# Patient Record
Sex: Female | Born: 1990 | Race: White | Hispanic: Yes | Marital: Single | Smoking: Never smoker
Health system: Southern US, Community
[De-identification: ages and names within clinical notes are randomized; demographics above are authoritative.]

## PROBLEM LIST (undated history)

## (undated) DIAGNOSIS — E039 Hypothyroidism, unspecified: Secondary | ICD-10-CM

## (undated) DIAGNOSIS — F32A Depression, unspecified: Secondary | ICD-10-CM

## (undated) DIAGNOSIS — E0590 Thyrotoxicosis, unspecified without thyrotoxic crisis or storm: Secondary | ICD-10-CM

## (undated) DIAGNOSIS — I10 Essential (primary) hypertension: Secondary | ICD-10-CM

## (undated) HISTORY — DX: Essential (primary) hypertension: I10

## (undated) HISTORY — PX: NO PAST SURGERIES: SHX2092

## (undated) HISTORY — DX: Thyrotoxicosis, unspecified without thyrotoxic crisis or storm: E05.90

---
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
## 2013-11-15 ENCOUNTER — Ambulatory Visit: Payer: Self-pay | Attending: Internal Medicine

## 2013-12-13 ENCOUNTER — Telehealth: Payer: Self-pay

## 2013-12-13 NOTE — Telephone Encounter (Signed)
Pt would like to set up an Appointment  to [facility].Pt can be reached at # listed in Epic.

## 2014-01-25 ENCOUNTER — Ambulatory Visit: Payer: Self-pay | Admitting: Family Medicine

## 2015-05-11 ENCOUNTER — Emergency Department (HOSPITAL_COMMUNITY)
Admission: EM | Admit: 2015-05-11 | Discharge: 2015-05-11 | Disposition: A | Discharge status: Home / Self Care | Payer: No Typology Code available for payment source | Attending: Emergency Medicine | Admitting: Emergency Medicine

## 2015-05-11 ENCOUNTER — Emergency Department (HOSPITAL_COMMUNITY): Payer: No Typology Code available for payment source

## 2015-05-11 ENCOUNTER — Encounter (HOSPITAL_COMMUNITY): Payer: Self-pay | Admitting: Emergency Medicine

## 2015-05-11 DIAGNOSIS — S8391XA Sprain of unspecified site of right knee, initial encounter: Secondary | ICD-10-CM | POA: Diagnosis not present

## 2015-05-11 DIAGNOSIS — F10129 Alcohol abuse with intoxication, unspecified: Secondary | ICD-10-CM | POA: Diagnosis not present

## 2015-05-11 DIAGNOSIS — S199XXA Unspecified injury of neck, initial encounter: Secondary | ICD-10-CM | POA: Diagnosis present

## 2015-05-11 DIAGNOSIS — S01511A Laceration without foreign body of lip, initial encounter: Secondary | ICD-10-CM | POA: Diagnosis not present

## 2015-05-11 DIAGNOSIS — Y92410 Unspecified street and highway as the place of occurrence of the external cause: Secondary | ICD-10-CM | POA: Diagnosis not present

## 2015-05-11 DIAGNOSIS — Z3202 Encounter for pregnancy test, result negative: Secondary | ICD-10-CM | POA: Insufficient documentation

## 2015-05-11 DIAGNOSIS — Y9389 Activity, other specified: Secondary | ICD-10-CM | POA: Diagnosis not present

## 2015-05-11 DIAGNOSIS — S161XXA Strain of muscle, fascia and tendon at neck level, initial encounter: Secondary | ICD-10-CM | POA: Diagnosis not present

## 2015-05-11 DIAGNOSIS — Y998 Other external cause status: Secondary | ICD-10-CM | POA: Diagnosis not present

## 2015-05-11 LAB — PREGNANCY, URINE: Preg Test, Ur: NEGATIVE

## 2015-05-11 MED ORDER — CYCLOBENZAPRINE HCL 10 MG PO TABS: 10.0000 mg | ORAL_TABLET | Freq: Three times a day (TID) | ORAL | Status: DC | PRN

## 2015-05-11 MED ORDER — TRAMADOL HCL 50 MG PO TABS: 50.0000 mg | ORAL_TABLET | Freq: Four times a day (QID) | ORAL | Status: DC | PRN
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
## 2015-05-11 NOTE — ED Notes (Signed)
Patient transported to X-ray 

## 2015-05-11 NOTE — Discharge Instructions (Signed)
Tramadol as prescribed as needed for pain.  Return to the emergency department if symptoms significantly worsen or change.   Mouth Laceration A mouth laceration is a deep cut inside your mouth. The cut may go into your lip or go all of the way through your mouth and cheek. The cut may involve your tongue, the insides of your check, or the upper surface of your mouth (palate). Mouth lacerations may bleed a lot and may need to be treated with stitches (sutures). HOME CARE  Take medicines only as told by your doctor.  If you were prescribed an antibiotic medicine, finish all of it even if you start to feel better.  Eat as told by your doctor. You may only be able to eat drink liquids or eat soft foods for a few days.  Rinse your mouth with a warm, salt-water rinse 4-6 times per day or as told by your doctor. You can make a salt-water rinse by mixing one tsp of salt into two cups of warm water.  Do not poke the sutures with your tongue. Doing that can loosen them.  Check your wound every day for signs of infection. It is normal to have a white or gray patch over your wound while it heals. Watch for:  Redness.  Puffiness (swelling).  Blood or pus.  Keep your mouth and teeth clean (oral hygiene) like you normally do, if possible. Gently brush your teeth with a soft, nylon-bristled toothbrush 2 times per day.  Keep all follow-up visits as told by your doctor. This is important. GET HELP IF:  You got a tetanus shot and you have swelling, really bad pain, redness, or bleeding at the injection site.  You have a fever.  Medicine does not help your pain.  You have redness, swelling, or pain at your wound that is getting worse.  You have fresh bleeding or pus coming from your wound.  The edges of your wound break open.  Your neck or throat is puffy or tender. GET HELP RIGHT AWAY IF:  You have swelling in your face or the area under your jaw.  You have trouble breathing or  swallowing.   This information is not intended to replace advice given to you by your health care provider. Make sure you discuss any questions you have with your health care provider.   Document Released: 12/16/2007 Document Revised: 11/13/2014 Document Reviewed: 06/20/2014 Elsevier Interactive Patient Education 2016 [employer]  Tourist information centre managerMotor Vehicle Collision It is common to have multiple bruises and sore muscles after a motor vehicle collision (MVC). These tend to feel worse for the first 24 hours. You may have the most stiffness and soreness over the first several hours. You may also feel worse when you wake up the first morning after your collision. After this point, you will usually begin to improve with each day. The speed of improvement often depends on the severity of the collision, the number of injuries, and the location and nature of these injuries. HOME CARE INSTRUCTIONS  Put ice on the injured area.  Put ice in a plastic bag.  Place a towel between your skin and the bag.  Leave the ice on for 15-20 minutes, 3-4 times a day, or as directed by your health care provider.  Drink enough fluids to keep your urine clear or pale yellow. Do not drink alcohol.  Take a warm shower or bath once or twice a day. This will increase blood flow to sore muscles.  You may  return to activities as directed by your caregiver. Be careful when lifting, as this may aggravate neck or back pain.  Only take over-the-counter or prescription medicines for pain, discomfort, or fever as directed by your caregiver. Do not use aspirin. This may increase bruising and bleeding. SEEK IMMEDIATE MEDICAL CARE IF:  You have numbness, tingling, or weakness in the arms or legs.  You develop severe headaches not relieved with medicine.  You have severe neck pain, especially tenderness in the middle of the back of your neck.  You have changes in bowel or bladder control.  There is increasing pain in any area of the  body.  You have shortness of breath, light-headedness, dizziness, or fainting.  You have chest pain.  You feel sick to your stomach (nauseous), throw up (vomit), or sweat.  You have increasing abdominal discomfort.  There is blood in your urine, stool, or vomit.  You have pain in your shoulder (shoulder strap areas).  You feel your symptoms are getting worse. MAKE SURE YOU:  Understand these instructions.  Will watch your condition.  Will get help right away if you are not doing well or get worse.   This information is not intended to replace advice given to you by your health care provider. Make sure you discuss any questions you have with your health care provider.   Document Released: 06/29/2005 Document Revised: 07/20/2014 Document Reviewed: 11/26/2010 Elsevier Interactive Patient Education [employer]
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
## 2016-06-27 ENCOUNTER — Emergency Department (HOSPITAL_COMMUNITY)
Admission: EM | Admit: 2016-06-27 | Discharge: 2016-06-27 | Disposition: A | Discharge status: Home / Self Care | Payer: Self-pay | Attending: Emergency Medicine | Admitting: Emergency Medicine

## 2016-06-27 ENCOUNTER — Encounter (HOSPITAL_COMMUNITY): Payer: Self-pay | Admitting: Emergency Medicine

## 2016-06-27 DIAGNOSIS — B373 Candidiasis of vulva and vagina: Secondary | ICD-10-CM | POA: Insufficient documentation

## 2016-06-27 DIAGNOSIS — B3731 Acute candidiasis of vulva and vagina: Secondary | ICD-10-CM

## 2016-06-27 LAB — URINALYSIS, ROUTINE W REFLEX MICROSCOPIC
Bilirubin Urine: NEGATIVE
Glucose, UA: NEGATIVE mg/dL
Hgb urine dipstick: NEGATIVE
Ketones, ur: NEGATIVE mg/dL
Leukocytes, UA: NEGATIVE
Nitrite: NEGATIVE
Protein, ur: NEGATIVE mg/dL
Specific Gravity, Urine: 1.02 (ref 1.005–1.030)
pH: 6 (ref 5.0–8.0)

## 2016-06-27 LAB — WET PREP, GENITAL
Clue Cells Wet Prep HPF POC: NONE SEEN
Sperm: NONE SEEN
Trich, Wet Prep: NONE SEEN
Yeast Wet Prep HPF POC: NONE SEEN

## 2016-06-27 LAB — POC URINE PREG, ED: Preg Test, Ur: NEGATIVE

## 2016-06-27 MED ORDER — FLUCONAZOLE 100 MG PO TABS
150.0000 mg | ORAL_TABLET | Freq: Once | ORAL | Status: AC
  Administered 2016-06-27: 150 mg via ORAL
  Filled 2016-06-27: qty 2

## 2016-06-27 MED ORDER — FLUCONAZOLE 150 MG PO TABS: 150.0000 mg | ORAL_TABLET | Freq: Every day | ORAL | 0 refills | Status: AC

## 2016-06-27 NOTE — ED Triage Notes (Signed)
Pt. Stated, I've been having vaginal pressure/pain for about 3 days, I looked yesterday and it looked white but I have not discharge.

## 2016-06-27 NOTE — ED Provider Notes (Signed)
[facility]-EMERGENCY DEPT Provider Note   CSN: [id number] Arrival date & time: 06/27/16  1331     History   Chief Complaint Chief Complaint  Patient presents with  . Vaginal Discharge    pressure , pain    HPI Nicole Keller is a 25 y.o. female.  The history is provided by the patient.  Vaginal Discharge   This is a new problem. The current episode started 2 days ago. The problem occurs constantly. The problem has not changed since onset.The discharge was white. She is not pregnant. Pertinent negatives include no fever, no abdominal swelling, no abdominal pain, no diarrhea, no nausea, no vomiting, no dysuria, no frequency, no genital burning and no genital lesions (?). She has tried nothing for the symptoms. Her past medical history does not include irregular periods, PID or STD.  -c/o vaginal pressure last 2-3 days and noticed white spots on vagina. No true discharge per pt.  History reviewed. No pertinent past medical history.  There are no active problems to display for this patient.   History reviewed. No pertinent surgical history.  OB History    No data available       Home Medications    Prior to Admission medications   Medication Sig Start Date End Date Taking? Authorizing Provider  Multiple Vitamins-Minerals (MULTIVITAMIN WITH MINERALS) tablet Take 1 tablet by mouth daily.   Yes Historical Provider, MD  traMADol (ULTRAM) 50 MG tablet Take 1 tablet (50 mg total) by mouth every 6 (six) hours as needed. Patient not taking: Reported on 06/27/2016 05/11/15   Geoffery Lyonsouglas Delo, MD    Family History No family history on file.  Social History Social History  Substance Use Topics  . Smoking status: Never Smoker  . Smokeless tobacco: Never Used  . Alcohol use Yes     Allergies   Patient has no known allergies.   Review of Systems Review of Systems  Constitutional: Negative for chills and fever.  Respiratory: Negative for shortness of breath.   Cardiovascular:  Negative for chest pain.  Gastrointestinal: Negative for abdominal pain, diarrhea, nausea and vomiting.  Genitourinary: Positive for vaginal discharge and vaginal pain. Negative for dysuria, frequency, hematuria, menstrual problem and pelvic pain.       Hurts to wipe after urination  Musculoskeletal: Negative for back pain.  Skin: Negative for rash.  Neurological: Negative for headaches.  All other systems reviewed and are negative.    Physical Exam Updated Vital Signs BP 143/98   Pulse 102   Temp 98.3 F (36.8 C) (Oral)   Resp 18   Ht 5\' 4"  (1.626 m)   Wt 63.5 kg   SpO2 99%   BMI 24.03 kg/m   Physical Exam  Constitutional: She appears well-developed and well-nourished. No distress.  HENT:  Head: Normocephalic and atraumatic.  Eyes: Conjunctivae are normal.  Neck: Neck supple.  Cardiovascular: Normal rate and regular rhythm.   No murmur heard. Pulmonary/Chest: Effort normal and breath sounds normal. No respiratory distress. She has no rales.  Abdominal: Soft. There is no tenderness.  Genitourinary: Pelvic exam was performed with patient prone. There is no rash or lesion on the right labia. There is no rash or lesion on the left labia. Cervix exhibits no motion tenderness and no discharge. Right adnexum displays no tenderness. Left adnexum displays no tenderness. Vaginal discharge found.  Musculoskeletal: She exhibits no edema.  Neurological: She is alert.  Skin: Skin is warm and dry.  Psychiatric: She has a normal mood and  affect.  Nursing note and vitals reviewed.    ED Treatments / Results  Labs (all labs ordered are listed, but only abnormal results are displayed) Labs Reviewed  WET PREP, GENITAL - Abnormal; Notable for the following:       Result Value   WBC, Wet Prep HPF POC MANY (*)    All other components within normal limits  URINALYSIS, ROUTINE W REFLEX MICROSCOPIC  POC URINE PREG, ED  GC/CHLAMYDIA PROBE AMP [facility]    EKG  EKG  Interpretation None       Radiology No results found.  Procedures Procedures (including critical care time)  Medications Ordered in ED Medications  fluconazole (DIFLUCAN) tablet 150 mg (not administered)     Initial Impression / Assessment and Plan / ED Course  I have reviewed the triage vital signs and the nursing notes.  Pertinent labs & imaging results that were available during my care of the patient were reviewed by me and considered in my medical decision making (see chart for details).  Clinical Course    Patient is a 25 year old female who presents for evaluation of 2 days of vaginal pain and discharge. Patient states she has seen white cottage cheeselike substance in her vagina. Patient denies any fever, abdominal pain, pelvic pain, abnormal vaginal bleeding.  On exam patient has no abdominal tenderness and is afebrile here. Pelvic exam demonstrated moderate amount of white vaginal discharge. Patient had no signs of PID such as cervical discharge, cervical motion tenderness or adnexal tenderness. Wet prep shows numerous wbc's but otherwise negative. We will elect to treat empirically for a yeast infection with 1 tenderness of Diflucan here in the ED.  Patient is discharged in good condition. If patient continues to have discharge or symptoms of send patient home with a prescription for second is of Diflucan in 72 hours.   Patient seen with attending, Dr. Jeraldine LootsLockwood.  Final Clinical Impressions(s) / ED Diagnoses   Final diagnoses:  Vaginal yeast infection    New Prescriptions New Prescriptions   FLUCONAZOLE (DIFLUCAN) 150 MG TABLET    Take 1 tablet (150 mg total) by mouth daily.     Dwana MelenaRobin Danni Shima, DO 06/27/16 1641    Gerhard Munchobert Lockwood, MD 06/28/16 Moses [id number]

## 2016-06-29 LAB — GC/CHLAMYDIA PROBE AMP (~~LOC~~) NOT AT ARMC
Chlamydia: NEGATIVE
Neisseria Gonorrhea: NEGATIVE
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
## 2017-01-26 ENCOUNTER — Ambulatory Visit (INDEPENDENT_AMBULATORY_CARE_PROVIDER_SITE_OTHER): Payer: Self-pay | Admitting: Physician Assistant

## 2017-01-26 ENCOUNTER — Ambulatory Visit: Payer: Self-pay | Attending: Internal Medicine | Admitting: *Deleted

## 2017-01-26 ENCOUNTER — Encounter (INDEPENDENT_AMBULATORY_CARE_PROVIDER_SITE_OTHER): Payer: Self-pay | Admitting: Physician Assistant

## 2017-01-26 ENCOUNTER — Other Ambulatory Visit: Payer: Self-pay

## 2017-01-26 ENCOUNTER — Ambulatory Visit (HOSPITAL_COMMUNITY)
Admission: RE | Admit: 2017-01-26 | Discharge: 2017-01-26 | Disposition: A | Discharge status: Home / Self Care | Payer: No Typology Code available for payment source | Source: Ambulatory Visit | Attending: Physician Assistant | Admitting: Physician Assistant

## 2017-01-26 VITALS — BP 131/83 | HR 109 | Temp 97.9°F | Ht 61.81 in | Wt 144.2 lb

## 2017-01-26 DIAGNOSIS — F418 Other specified anxiety disorders: Secondary | ICD-10-CM | POA: Insufficient documentation

## 2017-01-26 DIAGNOSIS — R000 Tachycardia, unspecified: Secondary | ICD-10-CM

## 2017-01-26 DIAGNOSIS — L308 Other specified dermatitis: Secondary | ICD-10-CM

## 2017-01-26 DIAGNOSIS — Z79891 Long term (current) use of opiate analgesic: Secondary | ICD-10-CM | POA: Insufficient documentation

## 2017-01-26 DIAGNOSIS — L309 Dermatitis, unspecified: Secondary | ICD-10-CM | POA: Insufficient documentation

## 2017-01-26 DIAGNOSIS — Z79899 Other long term (current) drug therapy: Secondary | ICD-10-CM | POA: Insufficient documentation

## 2017-01-26 MED ORDER — CLONAZEPAM 0.5 MG PO TABS: 0.5000 mg | ORAL_TABLET | Freq: Every day | ORAL | 0 refills | Status: DC

## 2017-01-26 MED ORDER — CARVEDILOL 6.25 MG PO TABS: 6.2500 mg | ORAL_TABLET | Freq: Two times a day (BID) | ORAL | 1 refills | Status: DC

## 2017-01-26 MED ORDER — TRIAMCINOLONE ACETONIDE 0.5 % EX CREA: 1.0000 "application " | TOPICAL_CREAM | Freq: Two times a day (BID) | CUTANEOUS | 0 refills | Status: AC

## 2017-01-26 NOTE — Patient Instructions (Addendum)
Eczema Eczema, also called atopic dermatitis, is a skin disorder that causes inflammation of the skin. It causes a red rash and dry, scaly skin. The skin becomes very itchy. Eczema is generally worse during the cooler winter months and often improves with the warmth of summer. Eczema usually starts showing signs in infancy. Some children outgrow eczema, but it may last through adulthood. What are the causes? The exact cause of eczema is not known, but it appears to run in families. People with eczema often have a family history of eczema, allergies, asthma, or hay fever. Eczema is not contagious. Flare-ups of the condition may be caused by:  Contact with something you are sensitive or allergic to.  Stress.  What are the signs or symptoms?  Dry, scaly skin.  Red, itchy rash.  Itchiness. This may occur before the skin rash and may be very intense. How is this diagnosed? The diagnosis of eczema is usually made based on symptoms and medical history. How is this treated? Eczema cannot be cured, but symptoms usually can be controlled with treatment and other strategies. A treatment plan might include:  Controlling the itching and scratching. ? Use over-the-counter antihistamines as directed for itching. This is especially useful at night when the itching tends to be worse. ? Use over-the-counter steroid creams as directed for itching. ? Avoid scratching. Scratching makes the rash and itching worse. It may also result in a skin infection (impetigo) due to a break in the skin caused by scratching.  Keeping the skin well moisturized with creams every day. This will seal in moisture and help prevent dryness. Lotions that contain alcohol and water should be avoided because they can dry the skin.  Limiting exposure to things that you are sensitive or allergic to (allergens).  Recognizing situations that cause stress.  Developing a plan to manage stress.  Follow these instructions at  home:  Only take over-the-counter or prescription medicines as directed by your health care provider.  Do not use anything on the skin without checking with your health care provider.  Keep baths or showers short (5 minutes) in warm (not hot) water. Use mild cleansers for bathing. These should be unscented. You may add nonperfumed bath oil to the bath water. It is best to avoid soap and bubble bath.  Immediately after a bath or shower, when the skin is still damp, apply a moisturizing ointment to the entire body. This ointment should be a petroleum ointment. This will seal in moisture and help prevent dryness. The thicker the ointment, the better. These should be unscented.  Keep fingernails cut short. Children with eczema may need to wear soft gloves or mittens at night after applying an ointment.  Dress in clothes made of cotton or cotton blends. Dress lightly, because heat increases itching.  A child with eczema should stay away from anyone with fever blisters or cold sores. The virus that causes fever blisters (herpes simplex) can cause a serious skin infection in children with eczema. Contact a health care provider if:  Your itching interferes with sleep.  Your rash gets worse or is not better within 1 week after starting treatment.  You see pus or soft yellow scabs in the rash area.  You have a fever.  You have a rash flare-up after contact with someone who has fever blisters. This information is not intended to replace advice given to you by your health care provider. Make sure you discuss any questions you have with your health care provider.   Document Released: 06/26/2000 Document Revised: 12/05/2015 Document Reviewed: 01/30/2013 Elsevier Interactive Patient Education  2017 [employer] Inc[employer] Panic Attacks Panic attacks are sudden, short feelings of great fear or discomfort. You may have them for no reason when you are relaxed, when you are uneasy (anxious), or when you are  sleeping. Follow these instructions at home:  Take all your medicines as told.  Check with your doctor before starting new medicines.  Keep all doctor visits. Contact a doctor if:  You are not able to take your medicines as told.  Your symptoms do not get better.  Your symptoms get worse. Get help right away if:  Your attacks seem different than your normal attacks.  You have thoughts about hurting yourself or others.  You take panic attack medicine and you have a side effect. This information is not intended to replace advice given to you by your health care provider. Make sure you discuss any questions you have with your health care provider. Document Released: 08/01/2010 Document Revised: 12/05/2015 Document Reviewed: 02/10/2013 Elsevier Interactive Patient Education  2017 [employer]

## 2017-01-26 NOTE — Progress Notes (Signed)
Pt came in today for EKG from [facility].  Pt arrived alert and oriented x 4,  NAD assessed.   Will route to PCP
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
## 2017-01-27 LAB — COMPREHENSIVE METABOLIC PANEL
ALT: 34 IU/L — ABNORMAL HIGH (ref 0–32)
AST: 24 IU/L (ref 0–40)
Albumin/Globulin Ratio: 1.5 (ref 1.2–2.2)
Albumin: 4.1 g/dL (ref 3.5–5.5)
Alkaline Phosphatase: 90 IU/L (ref 39–117)
BUN/Creatinine Ratio: 28 — ABNORMAL HIGH (ref 9–23)
BUN: 10 mg/dL (ref 6–20)
Bilirubin Total: 0.5 mg/dL (ref 0.0–1.2)
CO2: 23 mmol/L (ref 20–29)
Calcium: 9.8 mg/dL (ref 8.7–10.2)
Chloride: 106 mmol/L (ref 96–106)
Creatinine, Ser: 0.36 mg/dL — ABNORMAL LOW (ref 0.57–1.00)
GFR calc Af Amer: 171 mL/min/{1.73_m2} (ref >59)
GFR calc non Af Amer: 148 mL/min/{1.73_m2} (ref >59)
Globulin, Total: 2.8 g/dL (ref 1.5–4.5)
Glucose: 112 mg/dL — ABNORMAL HIGH (ref 65–99)
Potassium: 4.5 mmol/L (ref 3.5–5.2)
Sodium: 145 mmol/L — ABNORMAL HIGH (ref 134–144)
Total Protein: 6.9 g/dL (ref 6.0–8.5)

## 2017-01-27 LAB — CBC WITH DIFFERENTIAL/PLATELET
Basophils Absolute: 0 10*3/uL (ref 0.0–0.2)
Basos: 1 %
EOS (ABSOLUTE): 0.2 10*3/uL (ref 0.0–0.4)
Eos: 3 %
Hematocrit: 45.1 % (ref 34.0–46.6)
Hemoglobin: 14.4 g/dL (ref 11.1–15.9)
Immature Grans (Abs): 0 10*3/uL (ref 0.0–0.1)
Immature Granulocytes: 0 %
Lymphocytes Absolute: 2.7 10*3/uL (ref 0.7–3.1)
Lymphs: 43 %
MCH: 25.2 pg — ABNORMAL LOW (ref 26.6–33.0)
MCHC: 31.9 g/dL (ref 31.5–35.7)
MCV: 79 fL (ref 79–97)
Monocytes Absolute: 0.5 10*3/uL (ref 0.1–0.9)
Monocytes: 8 %
Neutrophils Absolute: 2.8 10*3/uL (ref 1.4–7.0)
Neutrophils: 45 %
Platelets: 324 10*3/uL (ref 150–379)
RBC: 5.72 x10E6/uL — ABNORMAL HIGH (ref 3.77–5.28)
RDW: 14.2 % (ref 12.3–15.4)
WBC: 6.2 10*3/uL (ref 3.4–10.8)

## 2017-01-27 LAB — TSH: TSH: <0.006 u[IU]/mL — ABNORMAL LOW (ref 0.450–4.500)

## 2017-01-28 ENCOUNTER — Other Ambulatory Visit (INDEPENDENT_AMBULATORY_CARE_PROVIDER_SITE_OTHER): Payer: Self-pay | Admitting: Physician Assistant

## 2017-01-28 DIAGNOSIS — R7989 Other specified abnormal findings of blood chemistry: Secondary | ICD-10-CM

## 2017-02-01 ENCOUNTER — Other Ambulatory Visit (INDEPENDENT_AMBULATORY_CARE_PROVIDER_SITE_OTHER): Payer: Self-pay | Admitting: Physician Assistant

## 2017-02-01 DIAGNOSIS — E0590 Thyrotoxicosis, unspecified without thyrotoxic crisis or storm: Secondary | ICD-10-CM

## 2017-02-01 MED ORDER — METHIMAZOLE 5 MG PO TABS: 5.0000 mg | ORAL_TABLET | Freq: Three times a day (TID) | ORAL | 0 refills | Status: DC

## 2017-02-03 LAB — THYROID PANEL WITH TSH
Free Thyroxine Index: 7.2 — ABNORMAL HIGH (ref 1.2–4.9)
T3 Uptake Ratio: 44 % — ABNORMAL HIGH (ref 24–39)
T4, Total: 16.3 ug/dL — ABNORMAL HIGH (ref 4.5–12.0)
TSH: 0.009 u[IU]/mL — ABNORMAL LOW (ref 0.450–4.500)

## 2017-02-03 LAB — SPECIMEN STATUS REPORT

## 2017-02-08 ENCOUNTER — Ambulatory Visit (HOSPITAL_COMMUNITY)
Admission: RE | Admit: 2017-02-08 | Discharge: 2017-02-08 | Disposition: A | Discharge status: Home / Self Care | Payer: Self-pay | Source: Ambulatory Visit | Attending: Physician Assistant | Admitting: Physician Assistant

## 2017-02-08 DIAGNOSIS — E0590 Thyrotoxicosis, unspecified without thyrotoxic crisis or storm: Secondary | ICD-10-CM | POA: Insufficient documentation

## 2017-02-10 ENCOUNTER — Encounter (INDEPENDENT_AMBULATORY_CARE_PROVIDER_SITE_OTHER): Payer: Self-pay | Admitting: Physician Assistant

## 2017-02-10 ENCOUNTER — Ambulatory Visit (INDEPENDENT_AMBULATORY_CARE_PROVIDER_SITE_OTHER): Payer: Self-pay | Admitting: Physician Assistant

## 2017-02-10 VITALS — BP 120/76 | HR 79 | Temp 97.9°F | Wt 144.2 lb

## 2017-02-10 DIAGNOSIS — E0590 Thyrotoxicosis, unspecified without thyrotoxic crisis or storm: Secondary | ICD-10-CM

## 2017-02-10 DIAGNOSIS — R000 Tachycardia, unspecified: Secondary | ICD-10-CM

## 2017-02-10 DIAGNOSIS — F418 Other specified anxiety disorders: Secondary | ICD-10-CM

## 2017-02-10 MED ORDER — ESCITALOPRAM OXALATE 10 MG PO TABS: 10.0000 mg | ORAL_TABLET | Freq: Every day | ORAL | 5 refills | Status: DC

## 2017-02-10 MED ORDER — METHIMAZOLE 5 MG PO TABS: 5.0000 mg | ORAL_TABLET | Freq: Three times a day (TID) | ORAL | 5 refills | Status: DC

## 2017-02-10 MED ORDER — CLONAZEPAM 0.5 MG PO TABS: 0.5000 mg | ORAL_TABLET | Freq: Every day | ORAL | 0 refills | Status: DC

## 2017-02-10 MED ORDER — CARVEDILOL 6.25 MG PO TABS: 6.2500 mg | ORAL_TABLET | Freq: Two times a day (BID) | ORAL | 5 refills | Status: DC
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
## 2017-02-10 NOTE — Patient Instructions (Signed)
Community Resources  Advocacy/Legal Legal Aid [place]:  1-866-219-5262  /  336-272-0148  Family Justice Center:  336-641-7233  [facility] 24-hr Crisis line:  336-273-7273  Women's Resource Center, [place]:  336-275-6090  Court Watch (custody):  336-275-2346  [place] [facility]:   336-279-9299    Baby & Breastfeeding Car Seat Inspection @ Various [place] Fire Depts.- call 336-373-2177  [facility]  336-832-6860  [facility] 336-878-6712  WIC: 336-641-3663 ([place]);  336-641-7571 ([place])  La Leche League:  1-877-452-5321   Childcare [place] [facility]: 336-369-5097 ([place]) / 336-887-8224 ([place])  - Child Care Resources/ Referrals/ Scholarships  - Head Start/ Early Head Start (call or apply online)  [place] DHHS: [place] Pre-[facility] :  1-800-859-0829 / 336-274-5437   Employment / Job Search [facility] [place]: 336-275-6090 / [address]  [place] [employer] [facility] ([employer]Link): 336-373-5922 ([place]) / 336-882-4141 ([place])  [facility]/ Career Center: 336-275-9801 / 336-282-7307  [facility] Job [facility] Career [facility]: 336-373-3764  DHHS Work First: 336-641-3447 ([place]) / 336-641-3447 ([place])  [facility] [place]:  336-676-5871   Financial Assistance [facility]:  336-553-2657  [employer]: 336-235-0368  [employer] (furniture):  336-370-4002  [place] [facility]: 336-373-4264  Low Income Energy Assistance  336-641-3000   Food Assistance DHHS- SNAP/ Food Stamps: 336-641-4588  WIC: [place]- 336-641-3663 ;  [place] 336-641-7571  Little Green Book- Free Meals  Little Blue Book- Free Food Pantries  During the summer, text "FOOD" to 877877   General Health / Clinics (Adults) Orange Card (for Adults) through [facility]: (336) 895-4900  [facility]:   336-832-8035  [facility]:   336-832-4444  Health Department:  336-641-3245  [facility]:  336-415-3877 / 336-641-2100  [facility] [place]:   336-373-0678  [facility]:   336-334-4822 x 50251   Housing [place] [facility]:   336-691-9521  [place] [facility]:  336-275-8501  Affordable Housing Managemnt:  336-273-0568   Immigrant/ Refugee Center for [place]ians ([employer]CG):  336-256-1065  [facility]:  336-379-0037  [employer] [facility] [facility]:  336-937-4701  [employer] Services:  336-617-0381  African Services Coalition:  336-574-2677   LGBTQ YouthSAFE  www.youthsafe[place].org  PFLAG  336-541-6754 / info@[place].org  The [facility] Project:  1-866-488-7386   Mental Health/ Substance Use [facility]  336-387-6161  [facility]:  336-832-9700 or 1-800-711-2635  [facility] of Care:  336-271-5888  [facility]:  336-294-1349  [facility]:  336-542-2884  [facility] (walk-ins)  336-676-6840 / [address]  [place]:  800-449-1287  Alcoholics Anonymous:  336-854-4278  Narcotics Anonymous:  800-365-1036  Quit Smoking Hotline:  800-QUIT-NOW (800-784-8669)   Parenting Children's Home Society:  800-632-1400  [place]: Education Center & Support Groups:  336-832-6682  YWCA: 336-273-3461  [employer]: Bringing Out the Best:  336-334-3120               Thriving at Three (Hispanic families): 336-256-1066  Healthy Start ([facility]):  336-387-6161 x2288  Parents as Teachers:  336-691-0024  [place] [facility]- Learning Together (Immigrants): 336-369-5001   Poison Control 800-222-1222  Sports & Recreation [employer] Open Doors Application: ymcanwnc.org/join/open-doors-financial-assistance/  City of [place] Recreation Centers: http://www.[place].gov/index.aspx?page=3615   Special Needs Family Support Network:  336-832-6507  Autism Society of :   336-333-0197 x1402 or x1412 /  800-785-1035  [facility] [place]:  336-334-5773     [facility] of [place]:  639-756-7127  Children's Developmental Service Agency (CDSA):  614-313-1867  [facility] (Care Coordination for Children):  832-174-4728   Transportation Medicaid Transportation: (228)557-1293 to apply  [place] Authority: 617-582-8419 (reduced-fare bus ID to Medicaid/ Medicare/ Orange Card)  SCAT Paratransit services: Eligible riders only, call 680-788-8945 for application   Tutoring/Mentoring [facility] Institute: 5746218194  [facility]/ Big Sisters: 810-573-3494 Man[place] Mason)  579-053-1793 ([place])  ACES through child's school: (989) 780-1980  [employer] Achievers: contact your local [place] Dys Mentor Program: 807-410-5797     Generalized Anxiety Disorder, Adult Generalized anxiety disorder (GAD) is a mental health disorder. People with this condition constantly worry about everyday events. Unlike normal anxiety, worry related to GAD is not triggered by a specific event. These worries also do not fade or get better with time. GAD interferes with life functions, including relationships, work, and school. GAD can vary from mild to severe. People with severe GAD can have intense waves of anxiety with physical symptoms (panic attacks). What are the causes? The exact cause of GAD is not known. What increases the risk? This condition is more likely to develop in:  Women.  People who have a family history of anxiety disorders.  People who are very shy.  People who experience very stressful life events, such as the death of a loved one.  People who have a very stressful family environment.  What are the signs or symptoms? People with GAD often worry excessively about many things in their lives, such as their health and family. They may also be overly concerned about:  Doing well at work.  Being on time.  Natural disasters.  Friendships.  Physical symptoms of GAD include:  Fatigue.  Muscle tension or having muscle twitches.  Trembling or feeling  shaky.  Being easily startled.  Feeling like your heart is pounding or racing.  Feeling out of breath or like you cannot take a deep breath.  Having trouble falling asleep or staying asleep.  Sweating.  Nausea, diarrhea, or irritable bowel syndrome (IBS).  Headaches.  Trouble concentrating or remembering facts.  Restlessness.  Irritability.  How is this diagnosed? Your health care provider can diagnose GAD based on your symptoms and medical history. You will also have a physical exam. The health care provider will ask specific questions about your symptoms, including how severe they are, when they started, and if they come and go. Your health care provider may ask you about your use of alcohol or drugs, including prescription medicines. Your health care provider may refer you to a mental health specialist for further evaluation. Your health care provider will do a thorough examination and may perform additional tests to rule out other possible causes of your symptoms. To be diagnosed with GAD, a person must have anxiety that:  Is out of his or her control.  Affects several different aspects of his or her life, such as work and relationships.  Causes distress that makes him or her unable to take part in normal activities.  Includes at least three physical symptoms of GAD, such as restlessness, fatigue, trouble concentrating, irritability, muscle tension, or sleep problems.  Before your health care provider can confirm a diagnosis of GAD, these symptoms must be present more days than they are not, and they must last for six months or longer. How is this treated? The following therapies are usually used to treat GAD:  Medicine. Antidepressant medicine is usually prescribed for long-term daily control. Antianxiety medicines may be added in  severe cases, especially when panic attacks occur.  Talk therapy (psychotherapy). Certain types of talk therapy can be helpful in treating GAD  by providing support, education, and guidance. Options include: ? Cognitive behavioral therapy (CBT). People learn coping skills and techniques to ease their anxiety. They learn to identify unrealistic or negative thoughts and behaviors and to replace them with positive ones. ? Acceptance and commitment therapy (ACT). This treatment teaches people how to be mindful as a way to cope with unwanted thoughts and feelings. ? Biofeedback. This process trains you to manage your body's response (physiological response) through breathing techniques and relaxation methods. You will work with a therapist while machines are used to monitor your physical symptoms.  Stress management techniques. These include yoga, meditation, and exercise.  A mental health specialist can help determine which treatment is best for you. Some people see improvement with one type of therapy. However, other people require a combination of therapies. Follow these instructions at home:  Take over-the-counter and prescription medicines only as told by your health care provider.  Try to maintain a normal routine.  Try to anticipate stressful situations and allow extra time to manage them.  Practice any stress management or self-calming techniques as taught by your health care provider.  Do not punish yourself for setbacks or for not making progress.  Try to recognize your accomplishments, even if they are small.  Keep all follow-up visits as told by your health care provider. This is important. Contact a health care provider if:  Your symptoms do not get better.  Your symptoms get worse.  You have signs of depression, such as: ? A persistently sad, cranky, or irritable mood. ? Loss of enjoyment in activities that used to bring you joy. ? Change in weight or eating. ? Changes in sleeping habits. ? Avoiding friends or family members. ? Loss of energy for normal tasks. ? Feelings of guilt or worthlessness. Get help right  away if:  You have serious thoughts about hurting yourself or others. If you ever feel like you may hurt yourself or others, or have thoughts about taking your own life, get help right away. You can go to your nearest emergency department or call:  Your local emergency services (911 in the [place]  A suicide crisis helpline, such as the National Suicide Prevention Lifeline at (815)748-1644. This is open 24 hours a day.  Summary  Generalized anxiety disorder (GAD) is a mental health disorder that involves worry that is not triggered by a specific event.  People with GAD often worry excessively about many things in their lives, such as their health and family.  GAD may cause physical symptoms such as restlessness, trouble concentrating, sleep problems, frequent sweating, nausea, diarrhea, headaches, and trembling or muscle twitching.  A mental health specialist can help determine which treatment is best for you. Some people see improvement with one type of therapy. However, other people require a combination of therapies. This information is not intended to replace advice given to you by your health care provider. Make sure you discuss any questions you have with your health care provider. Document Released: 10/24/2012 Document Revised: 05/19/2016 Document Reviewed: 05/19/2016 Elsevier Interactive Patient Education  2018 [employer]. Living With Depression Everyone experiences occasional disappointment, sadness, and loss in their lives. When you are feeling down, blue, or sad for at least 2 weeks in a row, it may mean that you have depression. Depression can affect your thoughts and feelings, relationships, daily activities, and physical health.  It is caused by changes in the way your brain functions. If you receive a diagnosis of depression, your health care provider will tell you which type of depression you have and what treatment options are available to you. If you are living with  depression, there are ways to help you recover from it and also ways to prevent it from coming back. How to cope with lifestyle changes Coping with stress Stress is your body's reaction to life changes and events, both good and bad. Stressful situations may include:  Getting married.  The death of a spouse.  Losing a job.  Retiring.  Having a baby.  Stress can last just a few hours or it can be ongoing. Stress can play a major role in depression, so it is important to learn both how to cope with stress and how to think about it differently. Talk with your health care provider or a counselor if you would like to learn more about stress reduction. He or she may suggest some stress reduction techniques, such as:  Music therapy. This can include creating music or listening to music. Choose music that you enjoy and that inspires you.  Mindfulness-based meditation. This kind of meditation can be done while sitting or walking. It involves being aware of your normal breaths, rather than trying to control your breathing.  Centering prayer. This is a kind of meditation that involves focusing on a spiritual word or phrase. Choose a word, phrase, or sacred image that is meaningful to you and that brings you peace.  Deep breathing. To do this, expand your stomach and inhale slowly through your nose. Hold your breath for 3-5 seconds, then exhale slowly, allowing your stomach muscles to relax.  Muscle relaxation. This involves intentionally tensing muscles then relaxing them.  Choose a stress reduction technique that fits your lifestyle and personality. Stress reduction techniques take time and practice to develop. Set aside 5-15 minutes a day to do them. Therapists can offer training in these techniques. The training may be covered by some insurance plans. Other things you can do to manage stress include:  Keeping a stress diary. This can help you learn what triggers your stress and ways to control  your response.  Understanding what your limits are and saying no to requests or events that lead to a schedule that is too full.  Thinking about how you respond to certain situations. You may not be able to control everything, but you can control how you react.  Adding humor to your life by watching funny films or TV shows.  Making time for activities that help you relax and not feeling guilty about spending your time this way.  Medicines Your health care provider may suggest certain medicines if he or she feels that they will help improve your condition. Avoid using alcohol and other substances that may prevent your medicines from working properly (may interact). It is also important to:  Talk with your pharmacist or health care provider about all the medicines that you take, their possible side effects, and what medicines are safe to take together.  Make it your goal to take part in all treatment decisions (shared decision-making). This includes giving input on the side effects of medicines. It is best if shared decision-making with your health care provider is part of your total treatment plan.  If your health care provider prescribes a medicine, you may not notice the full benefits of it for 4-8 weeks. Most people who are treated for  depression need to be on medicine for at least 6-12 months after they feel better. If you are taking medicines as part of your treatment, do not stop taking medicines without first talking to your health care provider. You may need to have the medicine slowly decreased (tapered) over time to decrease the risk of harmful side effects. Relationships Your health care provider may suggest family therapy along with individual therapy and drug therapy. While there may not be family problems that are causing you to feel depressed, it is still important to make sure your family learns as much as they can about your mental health. Having your family's support can help make  your treatment successful. How to recognize changes in your condition Everyone has a different response to treatment for depression. Recovery from major depression happens when you have not had signs of major depression for two months. This may mean that you will start to:  Have more interest in doing activities.  Feel less hopeless than you did 2 months ago.  Have more energy.  Overeat less often, or have better or improving appetite.  Have better concentration.  Your health care provider will work with you to decide the next steps in your recovery. It is also important to recognize when your condition is getting worse. Watch for these signs:  Having fatigue or low energy.  Eating too much or too little.  Sleeping too much or too little.  Feeling restless, agitated, or hopeless.  Having trouble concentrating or making decisions.  Having unexplained physical complaints.  Feeling irritable, angry, or aggressive.  Get help as soon as you or your family members notice these symptoms coming back. How to get support and help from others How to talk with friends and family members about your condition Talking to friends and family members about your condition can provide you with one way to get support and guidance. Reach out to trusted friends or family members, explain your symptoms to them, and let them know that you are working with a health care provider to treat your depression. Financial resources Not all insurance plans cover mental health care, so it is important to check with your insurance carrier. If paying for co-pays or counseling services is a problem, search for a local or county mental health care center. They may be able to offer public mental health care services at low or no cost when you are not able to see a private health care provider. If you are taking medicine for depression, you may be able to get the generic form, which may be less expensive. Some makers of  prescription medicines also offer help to patients who cannot afford the medicines they need. Follow these instructions at home:  Get the right amount and quality of sleep.  Cut down on using caffeine, tobacco, alcohol, and other potentially harmful substances.  Try to exercise, such as walking or lifting small weights.  Take over-the-counter and prescription medicines only as told by your health care provider.  Eat a healthy diet that includes plenty of vegetables, fruits, whole grains, low-fat dairy products, and lean protein. Do not eat a lot of foods that are high in solid fats, added sugars, or salt.  Keep all follow-up visits as told by your health care provider. This is important. Contact a health care provider if:  You stop taking your antidepressant medicines, and you have any of these symptoms: ? Nausea. ? Headache. ? Feeling lightheaded. ? Chills and body aches. ? Not  being able to sleep (insomnia).  You or your friends and family think your depression is getting worse. Get help right away if:  You have thoughts of hurting yourself or others. If you ever feel like you may hurt yourself or others, or have thoughts about taking your own life, get help right away. You can go to your nearest emergency department or call:  Your local emergency services (911 in the [place]  A suicide crisis helpline, such as the National Suicide Prevention Lifeline at 534-273-33711-508-534-5649. This is open 24-hours a day.  Summary  If you are living with depression, there are ways to help you recover from it and also ways to prevent it from coming back.  Work with your health care team to create a management plan that includes counseling, stress management techniques, and healthy lifestyle habits. This information is not intended to replace advice given to you by your health care provider. Make sure you discuss any questions you have with your health care provider. Document Released: 06/01/2016  Document Revised: 06/01/2016 Document Reviewed: 06/01/2016 Elsevier Interactive Patient Education  [employer]2018 Elsevier Inc.

## 2017-02-12 ENCOUNTER — Ambulatory Visit (INDEPENDENT_AMBULATORY_CARE_PROVIDER_SITE_OTHER): Payer: Self-pay

## 2017-02-19 ENCOUNTER — Ambulatory Visit (INDEPENDENT_AMBULATORY_CARE_PROVIDER_SITE_OTHER): Payer: Self-pay

## 2017-03-01 ENCOUNTER — Ambulatory Visit: Payer: Self-pay

## 2017-03-16 MED FILL — ESCITALOPRAM 10 MG TABLET: 10 | 30 days supply | Qty: 30 | Fill #0

## 2017-03-16 MED FILL — methIMAzole 5 MG TABS: 5 | 30 days supply | Qty: 90 | Fill #0

## 2017-03-24 ENCOUNTER — Encounter (INDEPENDENT_AMBULATORY_CARE_PROVIDER_SITE_OTHER): Payer: Self-pay | Admitting: Physician Assistant

## 2017-03-24 ENCOUNTER — Ambulatory Visit (INDEPENDENT_AMBULATORY_CARE_PROVIDER_SITE_OTHER): Payer: Self-pay | Admitting: Physician Assistant

## 2017-03-24 VITALS — BP 112/72 | HR 85 | Temp 98.2°F | Wt 147.8 lb

## 2017-03-24 DIAGNOSIS — E0590 Thyrotoxicosis, unspecified without thyrotoxic crisis or storm: Secondary | ICD-10-CM

## 2017-03-24 DIAGNOSIS — Z23 Encounter for immunization: Secondary | ICD-10-CM

## 2017-03-24 DIAGNOSIS — F418 Other specified anxiety disorders: Secondary | ICD-10-CM

## 2017-03-24 DIAGNOSIS — Z114 Encounter for screening for human immunodeficiency virus [HIV]: Secondary | ICD-10-CM

## 2017-03-24 NOTE — Progress Notes (Signed)
Subjective:  Patient ID: Nicole Keller Hanna, female    DOB: 07/16/1990  Age: 26 y.o. MRN: [id number]  CC: F/u  HPI  Nicole Keller Allcorn is a 26 y.o. female with a PMH of depression, anxiety, tachycardia, hyperthyroidism, and dishidrotic eczema presents to follow up on depression/anxiety and hypothyroidism. She was started on Escitalopram 10 mg. Refilled Clonazepam 0.5 mg and methimazole 5 mg. Takes medications as directed. Feels less stress, anxiety, and worries. Had a few consecutive days that she did not take medication and began to feel depressed and anxious again. Overall she is feeling much better and is thankful her her current treatment regimen. No complaints or symptoms.    Outpatient Medications Prior to Visit  Medication Sig Dispense Refill  . carvedilol (COREG) 6.25 MG tablet Take 1 tablet (6.25 mg total) by mouth 2 (two) times daily with a meal. 60 tablet 5  . clonazePAM (KLONOPIN) 0.5 MG tablet Take 1 tablet (0.5 mg total) by mouth at bedtime. 30 tablet 0  . escitalopram (LEXAPRO) 10 MG tablet Take 1 tablet (10 mg total) by mouth daily. 30 tablet 5  . methimazole (TAPAZOLE) 5 MG tablet Take 1 tablet (5 mg total) by mouth 3 (three) times daily. 90 tablet 5  . Multiple Vitamins-Minerals (MULTIVITAMIN WITH MINERALS) tablet Take 1 tablet by mouth daily.     No facility-administered medications prior to visit.      ROS Review of Systems  Constitutional: Negative for chills, fever and malaise/fatigue.  Eyes: Negative for blurred vision.  Respiratory: Negative for shortness of breath.   Cardiovascular: Negative for chest pain and palpitations.  Gastrointestinal: Negative for abdominal pain and nausea.  Genitourinary: Negative for dysuria and hematuria.  Musculoskeletal: Negative for joint pain and myalgias.  Skin: Negative for rash.  Neurological: Negative for tingling and headaches.  Psychiatric/Behavioral: Negative for depression. The patient is not nervous/anxious.     Objective:   BP 112/72 (BP Location: Left Arm, Patient Position: Sitting, Cuff Size: Normal)   Pulse 85   Temp 98.2 F (36.8 C) (Oral)   Wt 147 lb 12.8 oz (67 kg)   SpO2 96%   BMI 27.20 kg/m   BP/Weight 03/24/2017 02/10/2017 01/26/2017  Systolic BP 112 120 131  Diastolic BP 72 76 83  Wt. (Lbs) 147.8 144.2 144.2  BMI 27.2 26.54 26.54      Physical Exam  Constitutional: She is oriented to person, place, and time.  Well developed, overweight, NAD, polite  HENT:  Head: Normocephalic and atraumatic.  Eyes: No scleral icterus.  Neck: Normal range of motion. Neck supple. No thyromegaly present.  Cardiovascular: Normal rate, regular rhythm and normal heart sounds.   No lower extremity edema  Pulmonary/Chest: Effort normal and breath sounds normal.  Neurological: She is alert and oriented to person, place, and time. No cranial nerve deficit. Coordination normal.  Skin: Skin is warm and dry. No rash noted. No erythema. No pallor.  Psychiatric: She has a normal mood and affect. Her behavior is normal. Thought content normal.  Vitals reviewed.    Assessment & Plan:    1. Hyperthyroidism - Thyroid Panel With TSH - Comprehensive metabolic panel  2. Anxiety with depression - Comprehensive metabolic panel  3. Encounter for screening for HIV - HIV antibody  4. Need for Tdap vaccination - Tdap vaccine greater than or equal to 7yo IM  5. Need for influenza vaccination - Flu Vaccine QUAD 6+ mos PF IM (Fluarix Quad PF)     Follow-up: Return in about 6  months (around 09/21/2017) for hyperthyroidism.   Loletta Specter PA

## 2017-03-24 NOTE — Patient Instructions (Signed)
Exercising to Stay Healthy Exercising regularly is important. It has many health benefits, such as:  Improving your overall fitness, flexibility, and endurance.  Increasing your bone density.  Helping with weight control.  Decreasing your body fat.  Increasing your muscle strength.  Reducing stress and tension.  Improving your overall health.  In order to become healthy and stay healthy, it is recommended that you do moderate-intensity and vigorous-intensity exercise. You can tell that you are exercising at a moderate intensity if you have a higher heart rate and faster breathing, but you are still able to hold a conversation. You can tell that you are exercising at a vigorous intensity if you are breathing much harder and faster and cannot hold a conversation while exercising. How often should I exercise? Choose an activity that you enjoy and set realistic goals. Your health care provider can help you to make an activity plan that works for you. Exercise regularly as directed by your health care provider. This may include:  Doing resistance training twice each week, such as: ? Push-ups. ? Sit-ups. ? Lifting weights. ? Using resistance bands.  Doing a given intensity of exercise for a given amount of time. Choose from these options: ? 150 minutes of moderate-intensity exercise every week. ? 75 minutes of vigorous-intensity exercise every week. ? A mix of moderate-intensity and vigorous-intensity exercise every week.  Children, pregnant women, people who are out of shape, people who are overweight, and older adults may need to consult a health care provider for individual recommendations. If you have any sort of medical condition, be sure to consult your health care provider before starting a new exercise program. What are some exercise ideas? Some moderate-intensity exercise ideas include:  Walking at a rate of 1 mile in 15  minutes.  Biking.  Hiking.  Golfing.  Dancing.  Some vigorous-intensity exercise ideas include:  Walking at a rate of at least 4.5 miles per hour.  Jogging or running at a rate of 5 miles per hour.  Biking at a rate of at least 10 miles per hour.  Lap swimming.  Roller-skating or in-line skating.  Cross-country skiing.  Vigorous competitive sports, such as football, basketball, and soccer.  Jumping rope.  Aerobic dancing.  What are some everyday activities that can help me to get exercise?  Yard work, such as: ? Pushing a lawn mower. ? Raking and bagging leaves.  Washing and waxing your car.  Pushing a stroller.  Shoveling snow.  Gardening.  Washing windows or floors. How can I be more active in my day-to-day activities?  Use the stairs instead of the elevator.  Take a walk during your lunch break.  If you drive, park your car farther away from work or school.  If you take public transportation, get off one stop early and walk the rest of the way.  Make all of your phone calls while standing up and walking around.  Get up, stretch, and walk around every 30 minutes throughout the day. What guidelines should I follow while exercising?  Do not exercise so much that you hurt yourself, feel dizzy, or get very short of breath.  Consult your health care provider before starting a new exercise program.  Wear comfortable clothes and shoes with good support.  Drink plenty of water while you exercise to prevent dehydration or heat stroke. Body water is lost during exercise and must be replaced.  Work out until you breathe faster and your heart beats faster. This information is not   intended to replace advice given to you by your health care provider. Make sure you discuss any questions you have with your health care provider. Document Released: 08/01/2010 Document Revised: 12/05/2015 Document Reviewed: 11/30/2013 Elsevier Interactive Patient Education  2018  Elsevier Inc.  

## 2017-03-25 ENCOUNTER — Other Ambulatory Visit (INDEPENDENT_AMBULATORY_CARE_PROVIDER_SITE_OTHER): Payer: Self-pay | Admitting: Physician Assistant

## 2017-03-25 DIAGNOSIS — E0590 Thyrotoxicosis, unspecified without thyrotoxic crisis or storm: Secondary | ICD-10-CM

## 2017-03-25 LAB — COMPREHENSIVE METABOLIC PANEL
ALBUMIN: 4.2 g/dL (ref 3.5–5.5)
ALT: 22 IU/L (ref 0–32)
AST: 14 IU/L (ref 0–40)
Albumin/Globulin Ratio: 1.7 (ref 1.2–2.2)
Alkaline Phosphatase: 107 IU/L (ref 39–117)
BILIRUBIN TOTAL: 0.5 mg/dL (ref 0.0–1.2)
BUN / CREAT RATIO: 23 (ref 9–23)
BUN: 10 mg/dL (ref 6–20)
CALCIUM: 9.3 mg/dL (ref 8.7–10.2)
CO2: 23 mmol/L (ref 20–29)
Chloride: 106 mmol/L (ref 96–106)
Creatinine, Ser: 0.44 mg/dL — ABNORMAL LOW (ref 0.57–1.00)
GFR, EST AFRICAN AMERICAN: 160 mL/min/{1.73_m2} (ref >59)
GFR, EST NON AFRICAN AMERICAN: 139 mL/min/{1.73_m2} (ref >59)
GLOBULIN, TOTAL: 2.5 g/dL (ref 1.5–4.5)
Glucose: 82 mg/dL (ref 65–99)
POTASSIUM: 4.3 mmol/L (ref 3.5–5.2)
Sodium: 144 mmol/L (ref 134–144)
TOTAL PROTEIN: 6.7 g/dL (ref 6.0–8.5)

## 2017-03-25 LAB — THYROID PANEL WITH TSH
FREE THYROXINE INDEX: 3.4 (ref 1.2–4.9)
T3 Uptake Ratio: 34 % (ref 24–39)
T4, Total: 9.9 ug/dL (ref 4.5–12.0)

## 2017-03-25 LAB — HIV ANTIBODY (ROUTINE TESTING W REFLEX): HIV Screen 4th Generation wRfx: NONREACTIVE

## 2017-03-25 MED ORDER — METHIMAZOLE 5 MG PO TABS: 5.0000 mg | ORAL_TABLET | Freq: Three times a day (TID) | ORAL | 5 refills | Status: DC

## 2017-04-29 ENCOUNTER — Encounter (INDEPENDENT_AMBULATORY_CARE_PROVIDER_SITE_OTHER): Payer: Self-pay | Admitting: Physician Assistant

## 2017-04-29 ENCOUNTER — Ambulatory Visit (INDEPENDENT_AMBULATORY_CARE_PROVIDER_SITE_OTHER): Payer: Self-pay | Admitting: Physician Assistant

## 2017-04-29 VITALS — BP 132/84 | HR 78 | Temp 98.0°F | Wt 152.4 lb

## 2017-04-29 DIAGNOSIS — R1084 Generalized abdominal pain: Secondary | ICD-10-CM

## 2017-04-29 DIAGNOSIS — F411 Generalized anxiety disorder: Secondary | ICD-10-CM

## 2017-04-29 LAB — POCT URINE PREGNANCY: PREG TEST UR: NEGATIVE

## 2017-04-29 MED ORDER — ALPRAZOLAM 0.25 MG PO TABS: 0.2500 mg | ORAL_TABLET | Freq: Two times a day (BID) | ORAL | 0 refills | Status: DC | PRN

## 2017-04-29 MED ORDER — ESCITALOPRAM OXALATE 20 MG PO TABS: 20.0000 mg | ORAL_TABLET | Freq: Every day | ORAL | 2 refills | Status: DC

## 2017-04-29 MED ORDER — HYOSCYAMINE SULFATE 0.125 MG PO TABS: 0.1250 mg | ORAL_TABLET | Freq: Three times a day (TID) | ORAL | 0 refills | Status: DC | PRN

## 2017-04-29 NOTE — Progress Notes (Signed)
Subjective:  Patient ID: Nicole Keller, female    DOB: [birth year]  Age: 26 y.o. MRN: [id number]  CC: dizzy, abdominal pain  HPI  Nicole Keller a 26 y.o.femalewith a PMH of depression, anxiety, tachycardia, hyperthyroidism, and dishidrotic eczema presents with abdominal pain and concern for pregnancy. Has an IUD in place. Endorses increased level of stress with her son's bad behavior. Has associated mucus in the stool.  Does not endorse f/c/n/v, hematochezia, tenesmus, fecal urgency, CP, SOB, HA, or rash.    Outpatient Medications Prior to Visit  Medication Sig Dispense Refill  . carvedilol (COREG) 6.25 MG tablet Take 1 tablet (6.25 mg total) by mouth 2 (two) times daily with a meal. 60 tablet 5  . escitalopram (LEXAPRO) 10 MG tablet Take 1 tablet (10 mg total) by mouth daily. 30 tablet 5  . methimazole (TAPAZOLE) 5 MG tablet Take 1 tablet (5 mg total) by mouth 3 (three) times daily. 90 tablet 5  . Multiple Vitamins-Minerals (MULTIVITAMIN WITH MINERALS) tablet Take 1 tablet by mouth daily.     No facility-administered medications prior to visit.      ROS Review of Systems  Constitutional: Negative for chills, fever and malaise/fatigue.  Eyes: Negative for blurred vision.  Respiratory: Negative for shortness of breath.   Cardiovascular: Negative for chest pain and palpitations.  Gastrointestinal: Positive for abdominal pain. Negative for nausea.  Genitourinary: Negative for dysuria and hematuria.  Musculoskeletal: Negative for joint pain and myalgias.  Skin: Negative for rash.  Neurological: Negative for tingling and headaches.  Psychiatric/Behavioral: Negative for depression. The patient is not nervous/anxious.     Objective:  BP 132/84 (BP Location: Left Arm, Patient Position: Sitting, Cuff Size: Normal)   Pulse 78   Temp 98 F (36.7 C) (Oral)   Wt 152 lb 6.4 oz (69.1 kg)   SpO2 98%   BMI 28.05 kg/m   BP/Weight 04/29/2017 03/24/2017 02/10/2017  Systolic BP 132 112  120  Diastolic BP 84 72 76  Wt. (Lbs) 152.4 147.8 144.2  BMI 28.05 27.2 26.54      Physical Exam  Constitutional: She is oriented to person, place, and time.  Well developed, well nourished, NAD, polite  HENT:  Head: Normocephalic and atraumatic.  Eyes: No scleral icterus.  Neck: Normal range of motion. Neck supple. No thyromegaly present.  Cardiovascular: Normal rate, regular rhythm and normal heart sounds.   Pulmonary/Chest: Effort normal and breath sounds normal. No respiratory distress.  Abdominal: Soft. Bowel sounds are normal. There is tenderness (mild RLQ and hypogastric TTP).  Musculoskeletal: She exhibits no edema.  Neurological: She is alert and oriented to person, place, and time. No cranial nerve deficit. Coordination normal.  Skin: Skin is warm and dry. No rash noted. No erythema. No pallor.  Psychiatric: She has a normal mood and affect. Her behavior is normal. Thought content normal.  Vitals reviewed.    Assessment & Plan:    1. Generalized anxiety disorder - Begin escitalopram (LEXAPRO) 20 MG tablet; Take 1 tablet (20 mg total) by mouth daily.  Dispense: 30 tablet; Refill: 2 - Begin ALPRAZolam (XANAX) 0.25 MG tablet; Take 1 tablet (0.25 mg total) by mouth 2 (two) times daily as needed for anxiety.  Dispense: 20 tablet; Refill: 0  2. Generalized abdominal pain - POCT urine pregnancy inconclusive. - Begin hyoscyamine (LEVSIN, ANASPAZ) 0.125 MG tablet; Take 1 tablet (0.125 mg total) by mouth 3 (three) times daily as needed.  Dispense: 30 tablet; Refill: 0 - hCG, serum, qualitative  Meds ordered this encounter  Medications  . hyoscyamine (LEVSIN, ANASPAZ) 0.125 MG tablet    Sig: Take 1 tablet (0.125 mg total) by mouth 3 (three) times daily as needed.    Dispense:  30 tablet    Refill:  0    Order Specific Question:   Supervising Provider    Answer:   Quentin AngstJEGEDE, OLUGBEMIGA E [id number]]  . escitalopram (LEXAPRO) 20 MG tablet    Sig: Take 1 tablet (20 mg total)  by mouth daily.    Dispense:  30 tablet    Refill:  2    Order Specific Question:   Supervising Provider    Answer:   Quentin AngstJEGEDE, OLUGBEMIGA E [id number]]  . ALPRAZolam (XANAX) 0.25 MG tablet    Sig: Take 1 tablet (0.25 mg total) by mouth 2 (two) times daily as needed for anxiety.    Dispense:  20 tablet    Refill:  0    Order Specific Question:   Supervising Provider    Answer:   Quentin AngstJEGEDE, OLUGBEMIGA E [[id number]]    Follow-up: Return in about 6 weeks (around 06/10/2017) for anxiety.   Loletta Specteroger David Tallan Sandoz PA

## 2017-04-29 NOTE — Patient Instructions (Signed)
Irritable Bowel Syndrome, Adult Irritable bowel syndrome (IBS) is not one specific disease. It is a group of symptoms that affects the organs responsible for digestion (gastrointestinal or GI tract). To regulate how your GI tract works, your body sends signals back and forth between your intestines and your brain. If you have IBS, there may be a problem with these signals. As a result, your GI tract does not function normally. Your intestines may become more sensitive and overreact to certain things. This is especially true when you eat certain foods or when you are under stress. There are four types of IBS. These may be determined based on the consistency of your stool:  IBS with diarrhea.  IBS with constipation.  Mixed IBS.  Unsubtyped IBS.  It is important to know which type of IBS you have. Some treatments are more likely to be helpful for certain types of IBS. What are the causes? The exact cause of IBS is not known. What increases the risk? You may have a higher risk of IBS if:  You are a woman.  You are younger than 28 years old.  You have a family history of IBS.  You have mental health problems.  You have had bacterial infection of your GI tract.  What are the signs or symptoms? Symptoms of IBS vary from person to person. The main symptom is abdominal pain or discomfort. Additional symptoms usually include one or more of the following:  Diarrhea, constipation, or both.  Abdominal swelling or bloating.  Feeling full or sick after eating a small or regular-size meal.  Frequent gas.  Mucus in the stool.  A feeling of having more stool left after a bowel movement.  Symptoms tend to come and go. They may be associated with stress, psychiatric conditions, or nothing at all. How is this diagnosed? There is no specific test to diagnose IBS. Your health care provider will make a diagnosis based on a physical exam, medical history, and your symptoms. You may have other  tests to rule out other conditions that may be causing your symptoms. These may include:  Blood tests.  X-rays.  CT scan.  Endoscopy and colonoscopy. This is a test in which your GI tract is viewed with a long, thin, flexible tube.  How is this treated? There is no cure for IBS, but treatment can help relieve symptoms. IBS treatment often includes:  Changes to your diet, such as: ? Eating more fiber. ? Avoiding foods that cause symptoms. ? Drinking more water. ? Eating regular, medium-sized portioned meals.  Medicines. These may include: ? Fiber supplements if you have constipation. ? Medicine to control diarrhea (antidiarrheal medicines). ? Medicine to help control muscle spasms in your GI tract (antispasmodic medicines). ? Medicines to help with any mental health issues, such as antidepressants or tranquilizers.  Therapy. ? Talk therapy may help with anxiety, depression, or other mental health issues that can make IBS symptoms worse.  Stress reduction. ? Managing your stress can help keep symptoms under control.  Follow these instructions at home:  Take medicines only as directed by your health care provider.  Eat a healthy diet. ? Avoid foods and drinks with added sugar. ? Include more whole grains, fruits, and vegetables gradually into your diet. This may be especially helpful if you have IBS with constipation. ? Avoid any foods and drinks that make your symptoms worse. These may include dairy products and caffeinated or carbonated drinks. ? Do not eat large meals. ? Drink enough   fluid to keep your urine clear or pale yellow.  Exercise regularly. Ask your health care provider for recommendations of good activities for you.  Keep all follow-up visits as directed by your health care provider. This is important. Contact a health care provider if:  You have constant pain.  You have trouble or pain with swallowing.  You have worsening diarrhea. Get help right away  if:  You have severe and worsening abdominal pain.  You have diarrhea and: ? You have a rash, stiff neck, or severe headache. ? You are irritable, sleepy, or difficult to awaken. ? You are weak, dizzy, or extremely thirsty.  You have bright red blood in your stool or you have black tarry stools.  You have unusual abdominal swelling that is painful.  You vomit continuously.  You vomit blood (hematemesis).  You have both abdominal pain and a fever. This information is not intended to replace advice given to you by your health care provider. Make sure you discuss any questions you have with your health care provider. Document Released: 06/29/2005 Document Revised: 11/29/2015 Document Reviewed: 03/16/2014 Elsevier Interactive Patient Education  2018 Elsevier Inc.  

## 2017-04-30 LAB — HCG, SERUM, QUALITATIVE: HCG, BETA SUBUNIT, QUAL, SERUM: NEGATIVE m[IU]/mL (ref <6)

## 2017-05-04 MED FILL — methIMAzole 5 MG TABS: 5 | 30 days supply | Qty: 90 | Fill #0

## 2017-06-17 ENCOUNTER — Ambulatory Visit (INDEPENDENT_AMBULATORY_CARE_PROVIDER_SITE_OTHER): Payer: Self-pay | Admitting: Physician Assistant

## 2017-06-17 ENCOUNTER — Encounter (INDEPENDENT_AMBULATORY_CARE_PROVIDER_SITE_OTHER): Payer: Self-pay | Admitting: Physician Assistant

## 2017-06-17 VITALS — BP 117/72 | HR 83 | Temp 98.3°F | Resp 18 | Ht 61.0 in | Wt 153.0 lb

## 2017-06-17 DIAGNOSIS — F418 Other specified anxiety disorders: Secondary | ICD-10-CM

## 2017-06-17 DIAGNOSIS — J029 Acute pharyngitis, unspecified: Secondary | ICD-10-CM

## 2017-06-17 DIAGNOSIS — E0590 Thyrotoxicosis, unspecified without thyrotoxic crisis or storm: Secondary | ICD-10-CM

## 2017-06-17 MED ORDER — AZITHROMYCIN 250 MG PO TABS: ORAL_TABLET | ORAL | 0 refills | Status: DC

## 2017-06-17 MED ORDER — ESCITALOPRAM OXALATE 20 MG PO TABS: 20.0000 mg | ORAL_TABLET | Freq: Every day | ORAL | 3 refills | Status: DC

## 2017-06-17 MED ORDER — NAPROXEN 500 MG PO TABS: 500.0000 mg | ORAL_TABLET | Freq: Two times a day (BID) | ORAL | 0 refills | Status: DC

## 2017-06-17 MED ORDER — MENTHOL 5.4 MG MT LOZG: 1.0000 | LOZENGE | OROMUCOSAL | 0 refills | Status: DC

## 2017-06-17 MED FILL — AZITHROMYCIN 250 MG TABLET: 250 | 5 days supply | Qty: 6 | Fill #0

## 2017-06-17 MED FILL — NAPROXEN 500 MG TABLET: 500 | 15 days supply | Qty: 30 | Fill #0

## 2017-06-17 MED FILL — ESCITALOPRAM 20 MG TABLET: 20 | 30 days supply | Qty: 30 | Fill #0

## 2017-06-17 NOTE — Patient Instructions (Signed)

## 2017-06-17 NOTE — Progress Notes (Signed)
Subjective:  Patient ID: Nicole Keller, female    DOB: 01/24/1991  Age: 26 y.o. MRN: [id number]  CC: sore throat  HPI  Quitman LivingsYesica Zunigais a 26 y.o.femalewith a PMH of depression, anxiety, tachycardia, hyperthyroidism, and dishidrotic eczema presents with sore throat x one week. No close contacts with the same. Feels as though the lower neck area is becoming swollen on both sides.    Has run out of escitalopram 20 mg two weeks. She is feeling dizzy since stopping the medication. Feels irritable and cries without reason. Had some left over Xanax with has helped for a short while. Would like a refill of escitalopram as she felt this helped control her anxiety better.    Outpatient Medications Prior to Visit  Medication Sig Dispense Refill  . ALPRAZolam (XANAX) 0.25 MG tablet Take 1 tablet (0.25 mg total) by mouth 2 (two) times daily as needed for anxiety. 20 tablet 0  . carvedilol (COREG) 6.25 MG tablet Take 1 tablet (6.25 mg total) by mouth 2 (two) times daily with a meal. 60 tablet 5  . escitalopram (LEXAPRO) 20 MG tablet Take 1 tablet (20 mg total) by mouth daily. 30 tablet 2  . hyoscyamine (LEVSIN, ANASPAZ) 0.125 MG tablet Take 1 tablet (0.125 mg total) by mouth 3 (three) times daily as needed. 30 tablet 0  . methimazole (TAPAZOLE) 5 MG tablet Take 1 tablet (5 mg total) by mouth 3 (three) times daily. 90 tablet 5  . Multiple Vitamins-Minerals (MULTIVITAMIN WITH MINERALS) tablet Take 1 tablet by mouth daily.     No facility-administered medications prior to visit.      ROS Review of Systems  Constitutional: Negative for chills, fever and malaise/fatigue.  HENT: Positive for sore throat.   Eyes: Negative for blurred vision.  Respiratory: Negative for shortness of breath.   Cardiovascular: Negative for chest pain and palpitations.  Gastrointestinal: Negative for abdominal pain and nausea.  Genitourinary: Negative for dysuria and hematuria.  Musculoskeletal: Negative for joint pain  and myalgias.  Skin: Negative for rash.  Neurological: Negative for tingling and headaches.  Psychiatric/Behavioral: Positive for depression. The patient is nervous/anxious.     Objective:     Physical Exam  Constitutional: She is oriented to person, place, and time.  Well developed, well nourished, NAD, polite  HENT:  Head: Normocephalic and atraumatic.  Oropharynx erythematous without exudates.  Eyes: No scleral icterus.  Neck: Normal range of motion. Neck supple. Thyromegaly (mild thyromegaly) present.  Cardiovascular: Normal rate, regular rhythm and normal heart sounds. Exam reveals no gallop.  No murmur heard. Pulmonary/Chest: Effort normal and breath sounds normal. No respiratory distress. She has no wheezes.  Musculoskeletal: She exhibits no edema.  Lymphadenopathy:    She has no cervical adenopathy.  Neurological: She is alert and oriented to person, place, and time. No cranial nerve deficit. Coordination normal.  Skin: Skin is warm and dry. No rash noted. No erythema. No pallor.  Psychiatric: She has a normal mood and affect. Her behavior is normal. Thought content normal.  Vitals reviewed.    Assessment & Plan:   1. Sore throat - azithromycin (ZITHROMAX) 250 MG tablet; Take two tablets today then one tablet daily thereafter.  Dispense: 6 tablet; Refill: 0 - Menthol (CEPACOL SORE THROAT) 5.4 MG LOZG; Use as directed 1 lozenge (5.4 mg total) in the mouth or throat every 2 (two) hours.  Dispense: 60 each; Refill: 0 - naproxen (NAPROSYN) 500 MG tablet; Take 1 tablet (500 mg total) by mouth 2 (two)  times daily with a meal.  Dispense: 30 tablet; Refill: 0  2. Depression with anxiety - escitalopram (LEXAPRO) 20 MG tablet; Take 1 tablet (20 mg total) by mouth daily.  Dispense: 90 tablet; Refill: 3  3. Hyperthyroidism - Thyroid Panel With TSH   Meds ordered this encounter  Medications  . azithromycin (ZITHROMAX) 250 MG tablet    Sig: Take two tablets today then one  tablet daily thereafter.    Dispense:  6 tablet    Refill:  0    Order Specific Question:   Supervising Provider    Answer:   Quentin AngstJEGEDE, OLUGBEMIGA E [id number]]  . Menthol (CEPACOL SORE THROAT) 5.4 MG LOZG    Sig: Use as directed 1 lozenge (5.4 mg total) in the mouth or throat every 2 (two) hours.    Dispense:  60 each    Refill:  0    Order Specific Question:   Supervising Provider    Answer:   Quentin AngstJEGEDE, OLUGBEMIGA E [id number]]  . escitalopram (LEXAPRO) 20 MG tablet    Sig: Take 1 tablet (20 mg total) by mouth daily.    Dispense:  90 tablet    Refill:  3    Order Specific Question:   Supervising Provider    Answer:   Quentin AngstJEGEDE, OLUGBEMIGA E [id number]]  . naproxen (NAPROSYN) 500 MG tablet    Sig: Take 1 tablet (500 mg total) by mouth 2 (two) times daily with a meal.    Dispense:  30 tablet    Refill:  0    Order Specific Question:   Supervising Provider    Answer:   Quentin AngstJEGEDE, OLUGBEMIGA E [[id number]]    Follow-up: Return in about 8 weeks (around 08/12/2017) for depression with anxiety.   Loletta Specteroger David Gomez PA

## 2017-06-18 LAB — THYROID PANEL WITH TSH
FREE THYROXINE INDEX: 3.2 (ref 1.2–4.9)
T3 UPTAKE RATIO: 33 % (ref 24–39)
T4 TOTAL: 9.7 ug/dL (ref 4.5–12.0)

## 2017-06-20 ENCOUNTER — Other Ambulatory Visit (INDEPENDENT_AMBULATORY_CARE_PROVIDER_SITE_OTHER): Payer: Self-pay | Admitting: Physician Assistant

## 2017-06-20 DIAGNOSIS — R000 Tachycardia, unspecified: Secondary | ICD-10-CM

## 2017-06-23 ENCOUNTER — Telehealth (INDEPENDENT_AMBULATORY_CARE_PROVIDER_SITE_OTHER): Payer: Self-pay

## 2017-06-23 NOTE — Telephone Encounter (Signed)
-----   Message from Loletta Specteroger David Gomez, PA-C sent at 06/23/2017 10:50 AM EST ----- Patient should continue on methimazole TID. Recheck in 3 months.

## 2017-06-23 NOTE — Telephone Encounter (Signed)
FWD to PCP. Tempestt S Roberts, CMA  

## 2017-06-23 NOTE — Telephone Encounter (Signed)
Patient aware. Jeanita Carneiro S Levia Waltermire, CMA  

## 2017-06-24 MED FILL — methIMAzole 5 MG TABS: 5 | 30 days supply | Qty: 90 | Fill #1

## 2017-07-19 ENCOUNTER — Other Ambulatory Visit: Payer: Self-pay

## 2017-07-19 ENCOUNTER — Ambulatory Visit (INDEPENDENT_AMBULATORY_CARE_PROVIDER_SITE_OTHER): Payer: Self-pay | Admitting: Physician Assistant

## 2017-07-19 ENCOUNTER — Encounter (INDEPENDENT_AMBULATORY_CARE_PROVIDER_SITE_OTHER): Payer: Self-pay | Admitting: Physician Assistant

## 2017-07-19 VITALS — BP 116/79 | HR 71 | Temp 98.4°F | Wt 156.4 lb

## 2017-07-19 DIAGNOSIS — E0590 Thyrotoxicosis, unspecified without thyrotoxic crisis or storm: Secondary | ICD-10-CM

## 2017-07-19 DIAGNOSIS — R000 Tachycardia, unspecified: Secondary | ICD-10-CM

## 2017-07-19 DIAGNOSIS — R635 Abnormal weight gain: Secondary | ICD-10-CM

## 2017-07-19 DIAGNOSIS — F418 Other specified anxiety disorders: Secondary | ICD-10-CM

## 2017-07-19 MED ORDER — CARVEDILOL 6.25 MG PO TABS: ORAL_TABLET | ORAL | 3 refills | Status: DC

## 2017-07-19 MED ORDER — PHENTERMINE HCL 15 MG PO CAPS: 15.0000 mg | ORAL_CAPSULE | ORAL | 0 refills | Status: DC

## 2017-07-19 MED ORDER — METHIMAZOLE 5 MG PO TABS: 5.0000 mg | ORAL_TABLET | Freq: Two times a day (BID) | ORAL | 5 refills | Status: DC

## 2017-07-19 NOTE — Patient Instructions (Signed)

## 2017-07-19 NOTE — Progress Notes (Signed)
Subjective:  Patient ID: Nicole Keller, female    DOB: 08/25/1990  Age: 27 y.o. MRN: [id number]  CC: weight gain  HPI  Quitman LivingsYesica Zunigais a 27 y.o.femalewith a PMH of depression, anxiety, tachycardia, hyperthyroidism, and dishidrotic eczema presents to f/u on depression with anxiety. Says she is taking Escitalopram as directed this time. PHQ9 score 0 and GAD7 score 3 in clinic today. Has felt much less stressed at work. Outlook of life is much improved.      Complains of Patient has gained 12 lbs since July 2018. Had issues with thyroid disease but seems to have stabilized once taking methimazole. Gained weight when she began taking Methimazole. She is eating a lower carb diet. Plans to get a gym membership. Does not endorse CP, palpitations, SOB, HA, presyncope, syncope, tingling, numbness, weakness, abdominal pain, f/c/n/v, rash, or GI/GU sxs.    Outpatient Medications Prior to Visit  Medication Sig Dispense Refill  . carvedilol (COREG) 6.25 MG tablet TAKE 1 TABLET BY MOUTH TWICE A DAY WITH A MEAL 60 tablet 2  . escitalopram (LEXAPRO) 20 MG tablet Take 1 tablet (20 mg total) by mouth daily. 90 tablet 3  . ALPRAZolam (XANAX) 0.25 MG tablet Take 1 tablet (0.25 mg total) by mouth 2 (two) times daily as needed for anxiety. (Patient not taking: Reported on 07/19/2017) 20 tablet 0  . hyoscyamine (LEVSIN, ANASPAZ) 0.125 MG tablet Take 1 tablet (0.125 mg total) by mouth 3 (three) times daily as needed. (Patient not taking: Reported on 07/19/2017) 30 tablet 0  . Menthol (CEPACOL SORE THROAT) 5.4 MG LOZG Use as directed 1 lozenge (5.4 mg total) in the mouth or throat every 2 (two) hours. (Patient not taking: Reported on 07/19/2017) 60 each 0  . methimazole (TAPAZOLE) 5 MG tablet Take 1 tablet (5 mg total) by mouth 3 (three) times daily. (Patient not taking: Reported on 07/19/2017) 90 tablet 5  . Multiple Vitamins-Minerals (MULTIVITAMIN WITH MINERALS) tablet Take 1 tablet by mouth daily.    . naproxen  (NAPROSYN) 500 MG tablet Take 1 tablet (500 mg total) by mouth 2 (two) times daily with a meal. (Patient not taking: Reported on 07/19/2017) 30 tablet 0  . azithromycin (ZITHROMAX) 250 MG tablet Take two tablets today then one tablet daily thereafter. 6 tablet 0   No facility-administered medications prior to visit.      ROS Review of Systems  Constitutional: Negative for chills, fever and malaise/fatigue.       Weight gain  Eyes: Negative for blurred vision.  Respiratory: Negative for shortness of breath.   Cardiovascular: Negative for chest pain and palpitations.  Gastrointestinal: Negative for abdominal pain and nausea.  Genitourinary: Negative for dysuria and hematuria.  Musculoskeletal: Negative for joint pain and myalgias.  Skin: Negative for rash.  Neurological: Negative for tingling and headaches.  Psychiatric/Behavioral: Negative for depression. The patient is nervous/anxious (mild).     Objective:  BP 116/79 (BP Location: Left Arm, Patient Position: Sitting, Cuff Size: Normal)   Pulse 71   Temp 98.4 F (36.9 C) (Oral)   Wt 156 lb 6.4 oz (70.9 kg)   SpO2 97%   BMI 29.55 kg/m   BP/Weight 07/19/2017 06/17/2017 04/29/2017  Systolic BP 116 117 132  Diastolic BP 79 72 84  Wt. (Lbs) 156.4 153 152.4  BMI 29.55 28.91 28.05      Physical Exam  Constitutional: She is oriented to person, place, and time.  Well developed, well nourished, NAD, polite  HENT:  Head: Normocephalic and  atraumatic.  Eyes: No scleral icterus.  Neck: Normal range of motion. Neck supple. No thyromegaly present.  Cardiovascular: Normal rate, regular rhythm and normal heart sounds.  Pulmonary/Chest: Effort normal and breath sounds normal.  Musculoskeletal: She exhibits no edema.  Neurological: She is alert and oriented to person, place, and time. No cranial nerve deficit. Coordination normal.  Skin: Skin is warm and dry. No rash noted. No erythema. No pallor.  Psychiatric: She has a normal mood and  affect. Her behavior is normal. Thought content normal.  Vitals reviewed.    Assessment & Plan:    1. Depression with anxiety - Continue on Escitalopram 20 mg  2. Weight gain - Likely due to correction of hyperthyroidism. - Begin phentermine 15 MG capsule; Take 1 capsule (15 mg total) by mouth every morning.  Dispense: 30 capsule; Refill: 0. Patient motivated to exercise and lose weight. Return in one month. Report any cardiac related symptoms.   3. Hyperthyroidism - Decrease methimazole (TAPAZOLE) 5 MG tablet; Take 1 tablet (5 mg total) by mouth 2 (two) times daily.  Dispense: 60 tablet; Refill: 5  4. Tachycardia - Refill carvedilol (COREG) 6.25 MG tablet; TAKE 1 TABLET BY MOUTH TWICE A DAY WITH A MEAL  Dispense: 180 tablet; Refill: 3   Meds ordered this encounter  Medications  . methimazole (TAPAZOLE) 5 MG tablet    Sig: Take 1 tablet (5 mg total) by mouth 2 (two) times daily.    Dispense:  60 tablet    Refill:  5    Please remind to safeguard against pregnancy.    Order Specific Question:   Supervising Provider    Answer:   Quentin Angst [id number]  . carvedilol (COREG) 6.25 MG tablet    Sig: TAKE 1 TABLET BY MOUTH TWICE A DAY WITH A MEAL    Dispense:  180 tablet    Refill:  3    Order Specific Question:   Supervising Provider    Answer:   Quentin Angst [id number]  . phentermine 15 MG capsule    Sig: Take 1 capsule (15 mg total) by mouth every morning.    Dispense:  30 capsule    Refill:  0    Order Specific Question:   Supervising Provider    Answer:   Quentin Angst [id number]    Follow-up: Return in about 4 weeks (around 08/16/2017) for Weight gain.   Loletta Specter PA

## 2017-07-20 ENCOUNTER — Telehealth (INDEPENDENT_AMBULATORY_CARE_PROVIDER_SITE_OTHER): Payer: Self-pay | Admitting: Physician Assistant

## 2017-07-20 NOTE — Telephone Encounter (Signed)
Patient wants a tablet instead of capsule, states friend takes tablet and her Rx was cheaper. Maryjean Mornempestt S Maysen Sudol, CMA

## 2017-07-20 NOTE — Telephone Encounter (Signed)
Pt came in to clinic and I told her capsule is fine. There is no difference in price at her dose.

## 2017-07-20 NOTE — Telephone Encounter (Signed)
Pt called to ask the pcp or the nurse about her medication phentermine 15 MG capsule  She has some question and need clarification, please follow up

## 2017-07-29 MED FILL — methIMAzole 5 MG TABS: 5 | 30 days supply | Qty: 60 | Fill #0 | Status: TO

## 2017-07-29 MED FILL — ?CARVEDILOL 6.25 MG TABLET: 6.25 | 30 days supply | Qty: 60 | Fill #0

## 2017-08-16 ENCOUNTER — Ambulatory Visit (INDEPENDENT_AMBULATORY_CARE_PROVIDER_SITE_OTHER): Payer: Self-pay | Admitting: Physician Assistant

## 2017-08-16 ENCOUNTER — Encounter (INDEPENDENT_AMBULATORY_CARE_PROVIDER_SITE_OTHER): Payer: Self-pay | Admitting: Physician Assistant

## 2017-08-16 VITALS — BP 114/74 | HR 88 | Temp 98.5°F | Resp 18 | Ht 61.0 in | Wt 155.0 lb

## 2017-08-16 DIAGNOSIS — F418 Other specified anxiety disorders: Secondary | ICD-10-CM

## 2017-08-16 DIAGNOSIS — Z6830 Body mass index (BMI) 30.0-30.9, adult: Secondary | ICD-10-CM

## 2017-08-16 DIAGNOSIS — E0590 Thyrotoxicosis, unspecified without thyrotoxic crisis or storm: Secondary | ICD-10-CM

## 2017-08-16 DIAGNOSIS — E669 Obesity, unspecified: Secondary | ICD-10-CM

## 2017-08-16 MED ORDER — ESCITALOPRAM OXALATE 20 MG PO TABS: 10.0000 mg | ORAL_TABLET | Freq: Every day | ORAL | 0 refills | Status: DC

## 2017-08-16 MED ORDER — PHENTERMINE HCL 37.5 MG PO CAPS: 37.5000 mg | ORAL_CAPSULE | ORAL | 0 refills | Status: DC

## 2017-08-16 NOTE — Progress Notes (Signed)
Subjective:  Patient ID: Nicole Keller, female    DOB: 11/26/1990  Age: 27 y.o. MRN: [id number]  CC: weight check  HPI  Quitman LivingsYesica Zunigais a 27 y.o.femalewith a PMH of depression, anxiety, tachycardia, hyperthyroidism, and dishidrotic eczema presents to f/u on her obesity treatment with phentermine. Currently on 15 mg dose. Weighed 156 lbs nearly one month ago. Weighs 155 lbs in clinic today. She is eating a lower carb diet. Has been going to the gym 5 days a week for one hour each day. Had issues with thyroid disease but seems to have stabilized once taking methimazole. Gained weight when she began taking Methimazole. Does not endorse CP, palpitations, SOB, HA, presyncope, syncope, tingling, numbness, weakness, abdominal pain, f/c/n/v, rash, or GI/GU sxs.    Outpatient Medications Prior to Visit  Medication Sig Dispense Refill  . carvedilol (COREG) 6.25 MG tablet TAKE 1 TABLET BY MOUTH TWICE A DAY WITH A MEAL 180 tablet 3  . escitalopram (LEXAPRO) 20 MG tablet Take 1 tablet (20 mg total) by mouth daily. 90 tablet 3  . methimazole (TAPAZOLE) 5 MG tablet Take 1 tablet (5 mg total) by mouth 2 (two) times daily. 60 tablet 5  . Multiple Vitamins-Minerals (MULTIVITAMIN WITH MINERALS) tablet Take 1 tablet by mouth daily.    . phentermine 15 MG capsule Take 1 capsule (15 mg total) by mouth every morning. 30 capsule 0   No facility-administered medications prior to visit.      ROS Review of Systems  Constitutional: Negative for chills, fever and malaise/fatigue.  Eyes: Negative for blurred vision.  Respiratory: Negative for shortness of breath.   Cardiovascular: Negative for chest pain and palpitations.  Gastrointestinal: Negative for abdominal pain and nausea.  Genitourinary: Negative for dysuria and hematuria.  Musculoskeletal: Negative for joint pain and myalgias.  Skin: Negative for rash.  Neurological: Negative for tingling and headaches.  Psychiatric/Behavioral: Negative for  depression. The patient is not nervous/anxious.     Objective:  BP 114/74 (BP Location: Right Arm, Patient Position: Sitting, Cuff Size: Normal)   Pulse 88   Temp 98.5 F (36.9 C) (Oral)   Resp 18   Ht 5\' 1"  (1.549 m)   Wt 155 lb (70.3 kg)   SpO2 100%   BMI 29.29 kg/m   BP/Weight 08/16/2017 07/19/2017 06/17/2017  Systolic BP 114 116 117  Diastolic BP 74 79 72  Wt. (Lbs) 155 156.4 153  BMI 29.29 29.55 28.91      Physical Exam  Constitutional: She is oriented to person, place, and time.  Well developed, well nourished, NAD, polite  HENT:  Head: Normocephalic and atraumatic.  Eyes: No scleral icterus.  Neck: Normal range of motion. Neck supple. No thyromegaly present.  Cardiovascular: Normal rate, regular rhythm and normal heart sounds.  Pulmonary/Chest: Effort normal and breath sounds normal.  Abdominal: Soft. Bowel sounds are normal. There is no tenderness.  Musculoskeletal: She exhibits no edema.  Neurological: She is alert and oriented to person, place, and time. No cranial nerve deficit. Coordination normal.  Skin: Skin is warm and dry. No rash noted. No erythema. No pallor.  Psychiatric: She has a normal mood and affect. Her behavior is normal. Thought content normal.  Vitals reviewed.    Assessment & Plan:   1. Depression with anxiety - Decrease escitalopram (LEXAPRO) 20 MG tablet; Take 0.5 tablets (10 mg total) by mouth daily.  Dispense: 30 tablet; Refill: 0  2. Hyperthyroidism - Thyroid Panel With TSH  3. Class 1 obesity without serious  comorbidity with body mass index (BMI) of 30.0 to 30.9 in adult, unspecified obesity type - Increase phentermine 37.5 MG capsule; Take 1 capsule (37.5 mg total) by mouth every morning.  Dispense: 30 capsule; Refill: 0   Meds ordered this encounter  Medications  . escitalopram (LEXAPRO) 20 MG tablet    Sig: Take 0.5 tablets (10 mg total) by mouth daily.    Dispense:  30 tablet    Refill:  0    Order Specific Question:    Supervising Provider    Answer:   Quentin Angst [id number]  . phentermine 37.5 MG capsule    Sig: Take 1 capsule (37.5 mg total) by mouth every morning.    Dispense:  30 capsule    Refill:  0    Order Specific Question:   Supervising Provider    Answer:   Quentin Angst [[id number]]    Follow-up: Return in about 4 weeks (around 09/13/2017) for obesity.   Loletta Specter PA

## 2017-08-16 NOTE — Patient Instructions (Signed)

## 2017-08-17 ENCOUNTER — Other Ambulatory Visit (INDEPENDENT_AMBULATORY_CARE_PROVIDER_SITE_OTHER): Payer: Self-pay | Admitting: Physician Assistant

## 2017-08-17 DIAGNOSIS — E0590 Thyrotoxicosis, unspecified without thyrotoxic crisis or storm: Secondary | ICD-10-CM

## 2017-08-17 LAB — THYROID PANEL WITH TSH
Free Thyroxine Index: 2.4 (ref 1.2–4.9)
T3 UPTAKE RATIO: 31 % (ref 24–39)
T4 TOTAL: 7.9 ug/dL (ref 4.5–12.0)

## 2017-08-17 NOTE — Telephone Encounter (Signed)
Medical Assistant left message on patient's home and cell voicemail. Voicemail states to give [place] call [place] with [facility] at [phone]. Patient is aware of continuing current medications and completing additional blood work when she returns in one month to work up graves disease. PCP would like to see if this is the cause of her hyperthyroidism.

## 2017-08-17 NOTE — Telephone Encounter (Signed)
-----   Message from Loletta Specteroger David Gomez, PA-C sent at 08/17/2017  2:40 PM EST ----- I will work up Grave's disease as the cause of her hyperthyroidism at her next visit. Lab order has already been placed. Continue on the same medications.

## 2017-08-25 ENCOUNTER — Ambulatory Visit (INDEPENDENT_AMBULATORY_CARE_PROVIDER_SITE_OTHER): Payer: Self-pay | Admitting: Physician Assistant

## 2017-09-13 ENCOUNTER — Ambulatory Visit (INDEPENDENT_AMBULATORY_CARE_PROVIDER_SITE_OTHER): Payer: Self-pay | Admitting: Physician Assistant

## 2017-09-22 ENCOUNTER — Ambulatory Visit (INDEPENDENT_AMBULATORY_CARE_PROVIDER_SITE_OTHER): Payer: Self-pay | Admitting: Physician Assistant

## 2017-10-04 ENCOUNTER — Encounter: Payer: Self-pay | Admitting: Physician Assistant

## 2017-10-04 ENCOUNTER — Ambulatory Visit: Payer: Self-pay | Attending: Family Medicine

## 2017-10-12 ENCOUNTER — Other Ambulatory Visit: Payer: Self-pay

## 2017-10-12 ENCOUNTER — Encounter (INDEPENDENT_AMBULATORY_CARE_PROVIDER_SITE_OTHER): Payer: Self-pay | Admitting: Physician Assistant

## 2017-10-12 ENCOUNTER — Ambulatory Visit (INDEPENDENT_AMBULATORY_CARE_PROVIDER_SITE_OTHER): Payer: Self-pay | Admitting: Physician Assistant

## 2017-10-12 VITALS — BP 136/87 | HR 66 | Temp 97.7°F | Ht 61.0 in | Wt 157.2 lb

## 2017-10-12 DIAGNOSIS — F418 Other specified anxiety disorders: Secondary | ICD-10-CM

## 2017-10-12 DIAGNOSIS — R000 Tachycardia, unspecified: Secondary | ICD-10-CM

## 2017-10-12 DIAGNOSIS — Z124 Encounter for screening for malignant neoplasm of cervix: Secondary | ICD-10-CM

## 2017-10-12 DIAGNOSIS — E0590 Thyrotoxicosis, unspecified without thyrotoxic crisis or storm: Secondary | ICD-10-CM

## 2017-10-12 HISTORY — DX: Tachycardia, unspecified: R00.0

## 2017-10-12 HISTORY — DX: Other specified anxiety disorders: F41.8

## 2017-10-12 MED ORDER — CARVEDILOL 6.25 MG PO TABS: ORAL_TABLET | ORAL | 3 refills | Status: DC

## 2017-10-12 MED ORDER — ESCITALOPRAM OXALATE 20 MG PO TABS: 10.0000 mg | ORAL_TABLET | Freq: Every day | ORAL | 1 refills | Status: DC

## 2017-10-12 NOTE — Patient Instructions (Signed)

## 2017-10-12 NOTE — Progress Notes (Signed)
Subjective:  Patient ID: Nicole Keller, female    DOB: 02/25/1991  Age: 27 y.o. MRN: [id number]  CC: f/u hyperthyroidism  HPI  [place]uitman LivingsYesica Zunigais a 27 y.o.femalewith a PMH of depression, anxiety, tachycardia, hyperthyroidism, and dishidrotic eczema presentsto f/u on depression/anxiety, palpitations, and hyperthyroidism. Reports having stopped escitalopram and carvedilol on her own. Says she knows she should have communicated with provider about stopping her medications. Had two episodes of feeling as if something was stuck in her throat. Also felt palpitations during an intense cardio workout. Thinks she needs to take medications again. Does not endorse any other cardiovascular or constitutional symptoms.     Outpatient Medications Prior to Visit  Medication Sig Dispense Refill  . carvedilol (COREG) 6.25 MG tablet TAKE 1 TABLET BY MOUTH TWICE A DAY WITH A MEAL 180 tablet 3  . methimazole (TAPAZOLE) 5 MG tablet Take 1 tablet (5 mg total) by mouth 2 (two) times daily. 60 tablet 5  . escitalopram (LEXAPRO) 20 MG tablet Take 0.5 tablets (10 mg total) by mouth daily. (Patient not taking: Reported on 10/12/2017) 30 tablet 0  . phentermine 37.5 MG capsule Take 1 capsule (37.5 mg total) by mouth every morning. (Patient not taking: Reported on 10/12/2017) 30 capsule 0  . Multiple Vitamins-Minerals (MULTIVITAMIN WITH MINERALS) tablet Take 1 tablet by mouth daily.     No facility-administered medications prior to visit.      ROS Review of Systems  Constitutional: Negative for chills, fever and malaise/fatigue.  Eyes: Negative for blurred vision.  Respiratory: Negative for shortness of breath.   Cardiovascular: Positive for palpitations. Negative for chest pain.  Gastrointestinal: Negative for abdominal pain and nausea.  Genitourinary: Negative for dysuria and hematuria.  Musculoskeletal: Negative for joint pain and myalgias.  Skin: Negative for rash.  Neurological: Negative for tingling and  headaches.  Psychiatric/Behavioral: Negative for depression. The patient is nervous/anxious.     Objective:  BP 136/87 (BP Location: Left Arm, Patient Position: Sitting, Cuff Size: Normal)   Pulse 66   Temp 97.7 F (36.5 C) (Oral)   Ht 5\' 1"  (1.549 m)   Wt 157 lb 3.2 oz (71.3 kg)   SpO2 100%   BMI 29.70 kg/m   BP/Weight 10/12/2017 08/16/2017 07/19/2017  Systolic BP 136 114 116  Diastolic BP 87 74 79  Wt. (Lbs) 157.2 155 156.4  BMI 29.7 29.29 29.55      Physical Exam  Constitutional: She is oriented to person, place, and time.  Well developed, well nourished, NAD, polite  HENT:  Head: Normocephalic and atraumatic.  Tonsils 1+, uvula midline, tongue nonedematous, no oropharyngeal lesions.  Eyes: Conjunctivae are normal. No scleral icterus.  Neck: Normal range of motion. Neck supple. No thyromegaly present.  Cardiovascular: Normal rate, regular rhythm and normal heart sounds.  Pulmonary/Chest: Effort normal and breath sounds normal. No respiratory distress. She has no wheezes.  Musculoskeletal: She exhibits no edema.  Neurological: She is alert and oriented to person, place, and time. No cranial nerve deficit. Coordination normal.  Skin: Skin is warm and dry. No rash noted. No erythema. No pallor.  Psychiatric: She has a normal mood and affect. Her behavior is normal. Thought content normal.  Vitals reviewed.    Assessment & Plan:   1. Depression with anxiety - Restart escitalopram (LEXAPRO) 20 MG tablet; Take 0.5 tablets (10 mg total) by mouth daily.  Dispense: 90 tablet; Refill: 1  2. Hyperthyroidism - Thyroid Panel With TSH - Thyrotropin receptor autoabs  3. Tachycardia - Restart  carvedilol (COREG) 6.25 MG tablet; TAKE 1 TABLET BY MOUTH TWICE A DAY WITH A MEAL  Dispense: 180 tablet; Refill: 3  4. Screening for cervical cancer - Pt referred to women[facility] clinic for PAP.   Meds ordered this encounter  Medications  . escitalopram (LEXAPRO) 20 MG tablet    Sig: Take  0.5 tablets (10 mg total) by mouth daily.    Dispense:  90 tablet    Refill:  1    Order Specific Question:   Supervising Provider    Answer:   Quentin Angst [id number]  . carvedilol (COREG) 6.25 MG tablet    Sig: TAKE 1 TABLET BY MOUTH TWICE A DAY WITH A MEAL    Dispense:  180 tablet    Refill:  3    Order Specific Question:   Supervising Provider    Answer:   Quentin Angst [[id number]]    Follow-up: 8 weeks for anxiety  Loletta Specter PA

## 2017-10-13 ENCOUNTER — Telehealth (INDEPENDENT_AMBULATORY_CARE_PROVIDER_SITE_OTHER): Payer: Self-pay

## 2017-10-13 LAB — THYROID PANEL WITH TSH
FREE THYROXINE INDEX: 1.5 (ref 1.2–4.9)
T3 UPTAKE RATIO: 26 % (ref 24–39)
T4, Total: 5.8 ug/dL (ref 4.5–12.0)
TSH: <0.006 u[IU]/mL — ABNORMAL LOW (ref 0.450–4.500)

## 2017-10-13 LAB — THYROTROPIN RECEPTOR AUTOABS: THYROTROPIN RECEPTOR AB: 5.59 IU/L — AB (ref 0.00–1.75)

## 2017-10-13 NOTE — Telephone Encounter (Signed)
Patient aware that she has graves disease which is an autoimmune disease that causes too much production of thyroid hormone. She is also aware of T4 levels trending down at her current dose of medication, so decrease methimazole from twice daily to once daily. Nicole Keller, CMA

## 2017-10-13 NOTE — Telephone Encounter (Signed)
-----   Message from Loletta Specteroger David Gomez, PA-C sent at 10/13/2017  9:24 AM EDT ----- Testing shows she has Graves disease which is an autoimmune disease causing the eventual production of too much thyroid hormone. Also her T4 level is trending down at her current dose and I would like for her to decrease methimazole to one tablet daily instead of twice per day. Thank you.

## 2017-11-26 ENCOUNTER — Ambulatory Visit (INDEPENDENT_AMBULATORY_CARE_PROVIDER_SITE_OTHER): Payer: Self-pay | Admitting: Family

## 2017-11-26 ENCOUNTER — Encounter: Payer: Self-pay | Admitting: Family

## 2017-11-26 VITALS — BP 126/79 | Resp 14 | Wt 158.0 lb

## 2017-11-26 DIAGNOSIS — Z124 Encounter for screening for malignant neoplasm of cervix: Secondary | ICD-10-CM

## 2017-11-26 DIAGNOSIS — K649 Unspecified hemorrhoids: Secondary | ICD-10-CM

## 2017-11-26 DIAGNOSIS — N898 Other specified noninflammatory disorders of vagina: Secondary | ICD-10-CM

## 2017-11-26 DIAGNOSIS — Z113 Encounter for screening for infections with a predominantly sexual mode of transmission: Secondary | ICD-10-CM

## 2017-11-26 DIAGNOSIS — Z01419 Encounter for gynecological examination (general) (routine) without abnormal findings: Secondary | ICD-10-CM

## 2017-11-26 DIAGNOSIS — Z01411 Encounter for gynecological examination (general) (routine) with abnormal findings: Secondary | ICD-10-CM

## 2017-11-26 MED ORDER — HYDROCORTISONE ACE-PRAMOXINE 1-1 % RE FOAM: 1.0000 | Freq: Two times a day (BID) | RECTAL | 0 refills | Status: DC

## 2017-11-26 NOTE — Progress Notes (Signed)
Subjective:     Nicole Keller is a 27 y.o. female here for a routine exam.  Current complaints: reports vaginal discharge x 1 month.  Reports yellow discharge, +odor.  With partner off and on x 7 years.  +two sons.  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    Gynecologic History No LMP recorded. Patient has had an implant. Contraception: IUD  X 2 years Last Pap: does not recall. Results were: normal Last mammogram: n/a.   Obstetric History OB History  No data available   Past Medical History:  Diagnosis Date  . Hypertension   . Hyperthyroidism     The following portions of the patient's history were reviewed and updated as appropriate: allergies, current medications, past family history, past medical history, past social history, past surgical history and problem list.  Review of Systems Pertinent items are noted in HPI.    Objective:      BP 126/79 (BP Location: Right Arm, Patient Position: Sitting, Cuff Size: Large)   Resp 14   Wt 158 lb (71.7 kg)   BMI 29.85 kg/m  General appearance: alert, cooperative and appears stated age Head: Normocephalic, without obvious abnormality, atraumatic Neck: no adenopathy, no carotid bruit, no JVD, supple, symmetrical, trachea midline and thyroid not enlarged, symmetric, no tenderness/mass/nodules Lungs: clear to auscultation bilaterally Breasts: normal appearance, no masses or tenderness, No nipple retraction or dimpling, No nipple discharge or bleeding, No axillary or supraclavicular adenopathy, Normal to palpation without dominant masses, Taught monthly breast self examination Heart: regular rate and rhythm, S1, S2 normal, no murmur, click, rub or gallop Abdomen: soft, non-tender; bowel sounds normal; no masses,  no organomegaly Pelvic: cervix normal in appearance, external genitalia normal, no adnexal masses or tenderness, no cervical motion tenderness, rectovaginal septum  normal, uterus normal size, shape, and consistency and vagina normal without discharge Skin: Skin color, texture, turgor normal. No rashes or lesions      Assessment:   Well Woman Exam Vaginal Discharge   Plan:  Thin Prep GC/CT/Trich/Yeast/Garnella  Contraception: IUD.    Reviewed self-breast exam Marlis Edelson, CNM

## 2017-11-27 LAB — SPECIMEN STATUS

## 2017-11-29 LAB — RPR: RPR Ser Ql: NONREACTIVE

## 2017-11-29 LAB — HIV ANTIBODY (ROUTINE TESTING W REFLEX): HIV Screen 4th Generation wRfx: NONREACTIVE

## 2017-11-30 ENCOUNTER — Telehealth (INDEPENDENT_AMBULATORY_CARE_PROVIDER_SITE_OTHER): Payer: Self-pay | Admitting: Physician Assistant

## 2017-11-30 LAB — CYTOLOGY - PAP
Bacterial vaginitis: POSITIVE — AB
CANDIDA VAGINITIS: NEGATIVE
Chlamydia: NEGATIVE
HPV: DETECTED — AB
Neisseria Gonorrhea: NEGATIVE
TRICH (WINDOWPATH): NEGATIVE

## 2017-11-30 NOTE — Telephone Encounter (Signed)
Pt called to state she is feeling sick/feel like passing out while doing light workouts like hiking, she was told to just give [place] a call if this continued.Please advise until her next office visit.  *Denied an office visit with covering provider, will wait for PCP*

## 2017-12-01 ENCOUNTER — Other Ambulatory Visit (INDEPENDENT_AMBULATORY_CARE_PROVIDER_SITE_OTHER): Payer: Self-pay | Admitting: Physician Assistant

## 2017-12-01 DIAGNOSIS — E0590 Thyrotoxicosis, unspecified without thyrotoxic crisis or storm: Secondary | ICD-10-CM

## 2017-12-01 NOTE — Telephone Encounter (Signed)
Spoke with patient and explained symptoms would need to be evaluated in the office. Since her next OV is not until 6/5 patient advised to go to urgent care if symptoms persist. Maryjean Morn, CMA

## 2017-12-07 ENCOUNTER — Telehealth: Payer: Self-pay | Admitting: General Practice

## 2017-12-07 ENCOUNTER — Other Ambulatory Visit: Payer: Self-pay | Admitting: Family

## 2017-12-07 ENCOUNTER — Encounter: Payer: Self-pay | Admitting: Family

## 2017-12-07 DIAGNOSIS — B9689 Other specified bacterial agents as the cause of diseases classified elsewhere: Secondary | ICD-10-CM

## 2017-12-07 DIAGNOSIS — N760 Acute vaginitis: Principal | ICD-10-CM

## 2017-12-07 DIAGNOSIS — R87612 Low grade squamous intraepithelial lesion on cytologic smear of cervix (LGSIL): Secondary | ICD-10-CM | POA: Insufficient documentation

## 2017-12-07 HISTORY — DX: Low grade squamous intraepithelial lesion on cytologic smear of cervix (LGSIL): R87.612

## 2017-12-07 MED ORDER — METRONIDAZOLE 500 MG PO TABS: 500.0000 mg | ORAL_TABLET | Freq: Two times a day (BID) | ORAL | 0 refills | Status: DC

## 2017-12-07 NOTE — Telephone Encounter (Signed)
Called and notified patient of appointment scheduled for 12/31/17 at 2:55pm with Vonzella Nipple, PA for a Colpo.  Patient voiced understanding.

## 2017-12-15 ENCOUNTER — Encounter (INDEPENDENT_AMBULATORY_CARE_PROVIDER_SITE_OTHER): Payer: Self-pay | Admitting: Physician Assistant

## 2017-12-15 ENCOUNTER — Ambulatory Visit (INDEPENDENT_AMBULATORY_CARE_PROVIDER_SITE_OTHER): Payer: Self-pay | Admitting: Physician Assistant

## 2017-12-15 ENCOUNTER — Other Ambulatory Visit: Payer: Self-pay

## 2017-12-15 VITALS — BP 127/82 | HR 71 | Temp 98.3°F | Ht 61.0 in | Wt 162.2 lb

## 2017-12-15 DIAGNOSIS — E0590 Thyrotoxicosis, unspecified without thyrotoxic crisis or storm: Secondary | ICD-10-CM

## 2017-12-15 DIAGNOSIS — F418 Other specified anxiety disorders: Secondary | ICD-10-CM

## 2017-12-15 MED ORDER — ESCITALOPRAM OXALATE 20 MG PO TABS: 20.0000 mg | ORAL_TABLET | Freq: Every day | ORAL | 1 refills | Status: DC

## 2017-12-15 MED ORDER — HYDROXYZINE HCL 10 MG PO TABS
10.0000 mg | ORAL_TABLET | Freq: Two times a day (BID) | ORAL | 2 refills | Status: DC
Start: 2017-12-15 — End: 2018-03-30

## 2017-12-15 NOTE — Progress Notes (Signed)
Subjective:  Patient ID: Nicole Keller, female    DOB: 1990/09/06  Age: 27 y.o. MRN: [id number]  CC: anxeity  HPI  Nicole Keller a 27 y.o.femalewith a PMH of depression, anxiety, tachycardia, hyperthyroidism, and dishidrotic eczema presentsto f/u on depression/anxiety. Was restarted on Lexapro two months ago after having stopped on her own. PHQ9 zero and GAD7 five today in clinic. She is feeling better in regards to mood but she is concerned about her weight. Has gained five lbs since her last visit two months ago. Exercises 2-3 times per week. Does not perform high intensity work outs because she feels "sick", feels tachycardic, has some chest discomfort, and feels anxious. Attributes symptoms to being "out of shape". Does not endorse radiation of chest discomfort, SOB, HA, tingling, numbness, weakness, abdominal pain, edema, rash, fever, chills, or GI/GU sxs.      Outpatient Medications Prior to Visit  Medication Sig Dispense Refill  . carvedilol (COREG) 6.25 MG tablet TAKE 1 TABLET BY MOUTH TWICE A DAY WITH A MEAL 180 tablet 3  . escitalopram (LEXAPRO) 20 MG tablet Take 0.5 tablets (10 mg total) by mouth daily. 90 tablet 1  . hydrocortisone-pramoxine (PROCTOFOAM HC) rectal foam Place 1 applicator rectally 2 (two) times daily. 10 g 0  . methimazole (TAPAZOLE) 5 MG tablet TAKE 1 TABLET BY MOUTH TWICE A DAY 60 tablet 2  . metroNIDAZOLE (FLAGYL) 500 MG tablet Take 1 tablet (500 mg total) by mouth 2 (two) times daily. 14 tablet 0   No facility-administered medications prior to visit.      ROS Review of Systems  Constitutional: Negative for chills, fever and malaise/fatigue.  Eyes: Negative for blurred vision.  Respiratory: Negative for shortness of breath.   Cardiovascular: Negative for chest pain and palpitations.  Gastrointestinal: Negative for abdominal pain and nausea.  Genitourinary: Negative for dysuria and hematuria.  Musculoskeletal: Negative for joint pain and  myalgias.  Skin: Negative for rash.  Neurological: Negative for tingling and headaches.  Psychiatric/Behavioral: Negative for depression. The patient is nervous/anxious.     Objective:  BP 127/82 (BP Location: Left Arm, Patient Position: Sitting, Cuff Size: Normal)   Pulse 71   Temp 98.3 F (36.8 C) (Oral)   Ht 5\' 1"  (1.549 m)   Wt 162 lb 3.2 oz (73.6 kg)   SpO2 97%   BMI 30.65 kg/m   BP/Weight 12/15/2017 11/26/2017 10/12/2017  Systolic BP 127 126 136  Diastolic BP 82 79 87  Wt. (Lbs) 162.2 158 157.2  BMI 30.65 29.85 29.7      Physical Exam  Constitutional: She is oriented to person, place, and time.  Well developed, overweight, NAD, polite  HENT:  Head: Normocephalic and atraumatic.  Eyes: No scleral icterus.  Neck: Normal range of motion. Neck supple. No thyromegaly present.  Cardiovascular: Normal rate, regular rhythm, normal heart sounds and intact distal pulses. Exam reveals no gallop and no friction rub.  No murmur heard. Pulmonary/Chest: Effort normal and breath sounds normal.  Abdominal: Soft. Bowel sounds are normal. There is no tenderness.  Musculoskeletal: She exhibits no edema.  Neurological: She is alert and oriented to person, place, and time.  Skin: Skin is warm and dry. No rash noted. No erythema. No pallor.  Psychiatric: Her behavior is normal. Thought content normal.  Mild to moderately anxious  Vitals reviewed.    Assessment & Plan:    1. Depression with anxiety - Increase escitalopram (LEXAPRO) 20 MG tablet; Take 1 tablet (20 mg total) by mouth daily.  Dispense: 90 tablet; Refill: 1 - Anxiety still not optimally controlled.   2. Hyperthyroidism - Thyroid Panel With TSH   Meds ordered this encounter  Medications  . hydrOXYzine (ATARAX/VISTARIL) 10 MG tablet    Sig: Take 1 tablet (10 mg total) by mouth 2 (two) times daily.    Dispense:  60 tablet    Refill:  2    Order Specific Question:   Supervising Provider    Answer:   Hoy RegisterNEWLIN, Nicole Keller  [[id number]]  . escitalopram (LEXAPRO) 20 MG tablet    Sig: Take 1 tablet (20 mg total) by mouth daily.    Dispense:  90 tablet    Refill:  1    Order Specific Question:   Supervising Provider    Answer:   Hoy RegisterNEWLIN, Nicole Keller [[id number]]    Follow-up: Return in about 1 month (around 01/12/2018) for anxiety.   Loletta Specteroger David Reda Citron PA

## 2017-12-15 NOTE — Patient Instructions (Signed)

## 2017-12-16 LAB — THYROID PANEL WITH TSH
FREE THYROXINE INDEX: 2.8 (ref 1.2–4.9)
T3 UPTAKE RATIO: 32 % (ref 24–39)
T4 TOTAL: 8.7 ug/dL (ref 4.5–12.0)

## 2017-12-17 ENCOUNTER — Encounter

## 2017-12-20 ENCOUNTER — Other Ambulatory Visit (INDEPENDENT_AMBULATORY_CARE_PROVIDER_SITE_OTHER): Payer: Self-pay | Admitting: Physician Assistant

## 2017-12-20 ENCOUNTER — Telehealth (INDEPENDENT_AMBULATORY_CARE_PROVIDER_SITE_OTHER): Payer: Self-pay | Admitting: Physician Assistant

## 2017-12-20 DIAGNOSIS — E0590 Thyrotoxicosis, unspecified without thyrotoxic crisis or storm: Secondary | ICD-10-CM

## 2017-12-20 MED ORDER — METHIMAZOLE 5 MG PO TABS: 5.0000 mg | ORAL_TABLET | Freq: Three times a day (TID) | ORAL | 3 refills | Status: DC

## 2017-12-20 NOTE — Telephone Encounter (Signed)
Pt LVM requesting her lab results conducted on 12/15/17, Please follow up when available

## 2017-12-21 ENCOUNTER — Telehealth (INDEPENDENT_AMBULATORY_CARE_PROVIDER_SITE_OTHER): Payer: Self-pay

## 2017-12-21 NOTE — Telephone Encounter (Signed)
-----   Message from Loletta Specteroger David Gomez, PA-C sent at 12/20/2017 12:41 PM EDT ----- Patient will have to increase methimazole to 5 mg TID. Repeat thyroid check in 3 months. Thank you.

## 2017-12-21 NOTE — Telephone Encounter (Signed)
Result request

## 2017-12-21 NOTE — Telephone Encounter (Signed)
Patient has already been provided with lab results. Nicole Keller

## 2017-12-21 NOTE — Telephone Encounter (Signed)
Patient is aware to increase methimazole to 5 mg TID. And to repeat her thyroid check in 3 months. Maryjean Mornempestt S Raford Brissett, CMA

## 2017-12-31 ENCOUNTER — Encounter: Payer: Self-pay | Admitting: Medical

## 2017-12-31 ENCOUNTER — Other Ambulatory Visit (HOSPITAL_COMMUNITY)
Admission: RE | Admit: 2017-12-31 | Discharge: 2017-12-31 | Disposition: A | Discharge status: Home / Self Care | Payer: Self-pay | Source: Ambulatory Visit | Attending: Medical | Admitting: Medical

## 2017-12-31 ENCOUNTER — Ambulatory Visit (INDEPENDENT_AMBULATORY_CARE_PROVIDER_SITE_OTHER): Payer: Self-pay | Admitting: Medical

## 2017-12-31 VITALS — Wt 160.0 lb

## 2017-12-31 DIAGNOSIS — R87612 Low grade squamous intraepithelial lesion on cytologic smear of cervix (LGSIL): Secondary | ICD-10-CM | POA: Insufficient documentation

## 2017-12-31 LAB — POCT PREGNANCY, URINE: PREG TEST UR: NEGATIVE

## 2017-12-31 NOTE — Patient Instructions (Signed)
Colposcopy, Care After  This sheet gives you information about how to care for yourself after your procedure. Your doctor may also give you more specific instructions. If you have problems or questions, contact your doctor.  What can I expect after the procedure?  If you did not have a tissue sample removed (did not have a biopsy), you may only have some spotting for a few days. You can go back to your normal activities.  If you had a tissue sample removed, it is common to have:  · Soreness and pain. This may last for a few days.  · Light-headedness.  · Mild bleeding from your vagina or dark-colored, grainy discharge from your vagina. This may last for a few days. You may need to wear a sanitary pad.  · Spotting for at least 48 hours after the procedure.    Follow these instructions at home:  · Take over-the-counter and prescription medicines only as told by your doctor. Ask your doctor what medicines you can start taking again. This is very important if you take blood-thinning medicine.  · Do not drive or use heavy machinery while taking prescription pain medicine.  · For 3 days, or as long as your doctor tells you, avoid:  ? Douching.  ? Using tampons.  ? Having sex.  · If you use birth control (contraception), keep using it.  · Limit activity for the first day after the procedure. Ask your doctor what activities are safe for you.  · It is up to you to get the results of your procedure. Ask your doctor when your results will be ready.  · Keep all follow-up visits as told by your doctor. This is important.  Contact a doctor if:  · You get a skin rash.  Get help right away if:  · You are bleeding a lot from your vagina. It is a lot of bleeding if you are using more than one pad an hour for 2 hours in a row.  · You have clumps of blood (blood clots) coming from your vagina.  · You have a fever.  · You have chills  · You have pain in your lower belly (pelvic area).  · You have signs of infection, such as vaginal  discharge that is:  ? Different than usual.  ? Yellow.  ? Bad-smelling.  · You have very pain or cramps in your lower belly that do not get better with medicine.  · You feel light-headed.  · You feel dizzy.  · You pass out (faint).  Summary  · If you did not have a tissue sample removed (did not have a biopsy), you may only have some spotting for a few days. You can go back to your normal activities.  · If you had a tissue sample removed, it is common to have mild pain and spotting for 48 hours.  · For 3 days, or as long as your doctor tells you, avoid douching, using tampons and having sex.  · Get help right away if you have bleeding, very bad pain, or signs of infection.  This information is not intended to replace advice given to you by your health care provider. Make sure you discuss any questions you have with your health care provider.  Document Released: 12/16/2007 Document Revised: 03/18/2016 Document Reviewed: 03/18/2016  Elsevier Interactive Patient Education © 2018 [employer]

## 2017-12-31 NOTE — Progress Notes (Signed)
    GYNECOLOGY CLINIC COLPOSCOPY PROCEDURE NOTE  [name] is a 27 y.o. here for colposcopy for low-grade squamous intraepithelial neoplasia (LGSIL - encompassing HPV,mild dysplasia,CIN I) pap smear on 11/26/17. Discussed role for HPV in cervical dysplasia, need for surveillance.  Patient given informed consent, signed copy in the chart, time out was performed.  Placed in lithotomy position. Cervix viewed with speculum and colposcope after application of acetic acid.   Colposcopy adequate? Yes  no visible lesions, no mosaicism and no punctation; NO biopsies obtained.  ECC specimen obtained. All specimens were labelled and sent to pathology.   Patient was given post procedure instructions.  Will follow up pathology and manage accordingly.  Routine preventative health maintenance measures emphasized.  Marny LowensteinWenzel, Kruti Horacek N, PA-C 12/31/2017 3:13 PM

## 2018-01-12 ENCOUNTER — Other Ambulatory Visit: Payer: Self-pay

## 2018-01-12 ENCOUNTER — Ambulatory Visit (INDEPENDENT_AMBULATORY_CARE_PROVIDER_SITE_OTHER): Payer: Self-pay | Admitting: Physician Assistant

## 2018-01-12 ENCOUNTER — Encounter (INDEPENDENT_AMBULATORY_CARE_PROVIDER_SITE_OTHER): Payer: Self-pay | Admitting: Physician Assistant

## 2018-01-12 VITALS — BP 109/74 | HR 71 | Temp 98.0°F | Ht 61.0 in | Wt 164.0 lb

## 2018-01-12 DIAGNOSIS — Z760 Encounter for issue of repeat prescription: Secondary | ICD-10-CM

## 2018-01-12 DIAGNOSIS — Z6830 Body mass index (BMI) 30.0-30.9, adult: Secondary | ICD-10-CM

## 2018-01-12 DIAGNOSIS — E669 Obesity, unspecified: Secondary | ICD-10-CM

## 2018-01-12 DIAGNOSIS — F418 Other specified anxiety disorders: Secondary | ICD-10-CM

## 2018-01-12 DIAGNOSIS — E0590 Thyrotoxicosis, unspecified without thyrotoxic crisis or storm: Secondary | ICD-10-CM

## 2018-01-12 MED ORDER — ESCITALOPRAM OXALATE 20 MG PO TABS: 20.0000 mg | ORAL_TABLET | Freq: Every day | ORAL | 0 refills | Status: DC

## 2018-01-12 MED ORDER — CARVEDILOL 6.25 MG PO TABS: ORAL_TABLET | ORAL | 1 refills | Status: DC

## 2018-01-12 MED ORDER — PHENTERMINE HCL 30 MG PO CAPS: 30.0000 mg | ORAL_CAPSULE | ORAL | 0 refills | Status: DC

## 2018-01-12 NOTE — Progress Notes (Signed)
Subjective:  Patient ID: Nicole Keller, female    DOB: [birth year]  Age: 27 y.o. MRN: [id number]  CC: f/u anxiety  HPI  Nicole Keller a 27 y.o.femalewith a PMH of depression, anxiety, tachycardia, hyperthyroidism, and dishidrotic eczema presentsto f/uon depression/anxiety. Says she is feeling good. Has a few work triggers but is able to handle stressors better. PHQ9 zero and GAD7 five last month. PHQ9 zero and GAD7 three in clinic today.    Pt had thyroid panel drawn last month with TSH suppression still present but normal T4 and T3. Was advised to increase methimazole to 5 mg TID from 5 mg BID.   Outpatient Medications Prior to Visit  Medication Sig Dispense Refill  . carvedilol (COREG) 6.25 MG tablet TAKE 1 TABLET BY MOUTH TWICE A DAY WITH A MEAL 180 tablet 3  . escitalopram (LEXAPRO) 20 MG tablet Take 1 tablet (20 mg total) by mouth daily. 90 tablet 1  . hydrOXYzine (ATARAX/VISTARIL) 10 MG tablet Take 1 tablet (10 mg total) by mouth 2 (two) times daily. 60 tablet 2  . methimazole (TAPAZOLE) 5 MG tablet Take 1 tablet (5 mg total) by mouth 3 (three) times daily. 90 tablet 3  . hydrocortisone-pramoxine (PROCTOFOAM HC) rectal foam Place 1 applicator rectally 2 (two) times daily. (Patient not taking: Reported on 12/31/2017) 10 g 0   No facility-administered medications prior to visit.      ROS Review of Systems  Constitutional: Negative for chills, fever and malaise/fatigue.  Eyes: Negative for blurred vision.  Respiratory: Negative for shortness of breath.   Cardiovascular: Negative for chest pain and palpitations.  Gastrointestinal: Negative for abdominal pain and nausea.  Genitourinary: Negative for dysuria and hematuria.  Musculoskeletal: Negative for joint pain and myalgias.  Skin: Negative for rash.  Neurological: Negative for tingling and headaches.  Psychiatric/Behavioral: Negative for depression. The patient is not nervous/anxious.     Objective:  BP 109/74 (BP  Location: Left Arm, Patient Position: Sitting, Cuff Size: Normal)   Pulse 71   Temp 98 F (36.7 C) (Oral)   Ht 5\' 1"  (1.549 m)   Wt 164 lb (74.4 kg)   SpO2 96%   BMI 30.99 kg/m   BP/Weight 01/12/2018 12/31/2017 12/15/2017  Systolic BP 109 - 127  Diastolic BP 74 - 82  Wt. (Lbs) 164 160 162.2  BMI 30.99 30.23 30.65      Physical Exam  Constitutional: She is oriented to person, place, and time.  Well developed, overweight, NAD, polite  HENT:  Head: Normocephalic and atraumatic.  Eyes: No scleral icterus.  Neck: Normal range of motion. Neck supple. No thyromegaly present.  Cardiovascular: Normal rate, regular rhythm and normal heart sounds.  Pulmonary/Chest: Effort normal and breath sounds normal.  Abdominal: Soft. Bowel sounds are normal. There is no tenderness.  Musculoskeletal: She exhibits no edema.  Neurological: She is alert and oriented to person, place, and time.  Skin: Skin is warm and dry. No rash noted. No erythema. No pallor.  Psychiatric: She has a normal mood and affect. Her behavior is normal. Thought content normal.  Vitals reviewed.    Assessment & Plan:   1. Hyperthyroidism - Ambulatory referral to Endocrinology.Pt has acquired CAFA.  2. Class 1 obesity with serious comorbidity and body mass index (BMI) of 30.0 to 30.9 in adult, unspecified obesity type - Begin phentermine 30 MG capsule; Take 1 capsule (30 mg total) by mouth every morning.  Dispense: 30 capsule; Refill: 0  3. Depression with anxiety - Refill escitalopram (  LEXAPRO) 20 MG tablet; Take 1 tablet (20 mg total) by mouth daily.  Dispense: 90 tablet; Refill: 0  4. Medication refill - carvedilol (COREG) 6.25 MG tablet; TAKE 1 TABLET BY MOUTH TWICE A DAY WITH A MEAL  Dispense: 180 tablet; Refill: 1   Meds ordered this encounter  Medications  . phentermine 30 MG capsule    Sig: Take 1 capsule (30 mg total) by mouth every morning.    Dispense:  30 capsule    Refill:  0    Order Specific  Question:   Supervising Provider    Answer:   NEWLIN, ENOBONG [[id number]]  . escitalopramHoy Register (LEXAPRO) 20 MG tablet    Sig: Take 1 tablet (20 mg total) by mouth daily.    Dispense:  90 tablet    Refill:  0    Order Specific Question:   Supervising Provider    Answer:   Hoy RegisterNEWLIN, ENOBONG [[id number]]  . carvedilol (COREG) 6.25 MG tablet    Sig: TAKE 1 TABLET BY MOUTH TWICE A DAY WITH A MEAL    Dispense:  180 tablet    Refill:  1    Order Specific Question:   Supervising Provider    Answer:   Hoy RegisterNEWLIN, ENOBONG [[id number]]    Follow-up: Return in about 1 month (around 02/09/2018) for thyroid panel and obesity.   Loletta Specteroger David Katena Petitjean PA

## 2018-01-12 NOTE — Patient Instructions (Signed)
Human Papillomavirus Quadrivalent Vaccine suspension for injection What is this medicine? HUMAN PAPILLOMAVIRUS VACCINE (HYOO muhn pap uh LOH muh vahy ruhs vak SEEN) is a vaccine. It is used to prevent infections of four types of the human papillomavirus. In women, the vaccine may lower your risk of getting cervical, vaginal, vulvar, or anal cancer and genital warts. In men, the vaccine may lower your risk of getting genital warts and anal cancer. You cannot get these diseases from the vaccine. This vaccine does not treat these diseases. This medicine may be used for other purposes; ask your health care provider or pharmacist if you have questions. COMMON BRAND NAME(S): Gardasil What should I tell my health care provider before I take this medicine? They need to know if you have any of these conditions: -fever or infection -hemophilia -HIV infection or AIDS -immune system problems -low platelet count -an unusual reaction to Human Papillomavirus Vaccine, yeast, other medicines, foods, dyes, or preservatives -pregnant or trying to get pregnant -breast-feeding How should I use this medicine? This vaccine is for injection in a muscle on your upper arm or thigh. It is given by a health care professional. Dennis Bast will be observed for 15 minutes after each dose. Sometimes, fainting happens after the vaccine is given. You may be asked to sit or lie down during the 15 minutes. Three doses are given. The second dose is given 2 months after the first dose. The last dose is given 4 months after the second dose. A copy of a Vaccine Information Statement will be given before each vaccination. Read this sheet carefully each time. The sheet may change frequently. Talk to your pediatrician regarding the use of this medicine in children. While this drug may be prescribed for children as young as 11 years of age for selected conditions, precautions do apply. Overdosage: If you think you have taken too much of this  medicine contact a poison control center or emergency room at once. NOTE: This medicine is only for you. Do not share this medicine with others. What if I miss a dose? All 3 doses of the vaccine should be given within 6 months. Remember to keep appointments for follow-up doses. Your health care provider will tell you when to return for the next vaccine. Ask your health care professional for advice if you are unable to keep an appointment or miss a scheduled dose. What may interact with this medicine? -other vaccines This list may not describe all possible interactions. Give your health care provider a list of all the medicines, herbs, non-prescription drugs, or dietary supplements you use. Also tell them if you smoke, drink alcohol, or use illegal drugs. Some items may interact with your medicine. What should I watch for while using this medicine? This vaccine may not fully protect everyone. Continue to have regular pelvic exams and cervical or anal cancer screenings as directed by your doctor. The Human Papillomavirus is a sexually transmitted disease. It can be passed by any kind of sexual activity that involves genital contact. The vaccine works best when given before you have any contact with the virus. Many people who have the virus do not have any signs or symptoms. Tell your doctor or health care professional if you have any reaction or unusual symptom after getting the vaccine. What side effects may I notice from receiving this medicine? Side effects that you should report to your doctor or health care professional as soon as possible: -allergic reactions like skin rash, itching or hives, swelling  of the face, lips, or tongue -breathing problems -feeling faint or lightheaded, falls Side effects that usually do not require medical attention (report to your doctor or health care professional if they continue or are bothersome): -cough -dizziness -fever -headache -nausea -redness, warmth,  swelling, pain, or itching at site where injected This list may not describe all possible side effects. Call your doctor for medical advice about side effects. You may report side effects to FDA at 1-800-FDA-1088. Where should I keep my medicine? This drug is given in a hospital or clinic and will not be stored at home. NOTE: This sheet is a summary. It may not cover all possible information. If you have questions about this medicine, talk to your doctor, pharmacist, or health care provider.  2018 Elsevier/Gold Standard (2013-08-21 13:14:33)

## 2018-01-20 ENCOUNTER — Telehealth: Payer: Self-pay | Admitting: General Practice

## 2018-01-20 NOTE — Telephone Encounter (Signed)
Called patient in regards to appointment on 01/21/18 @ 0950 to remove IUD.  Patient verbalized understanding.

## 2018-01-21 ENCOUNTER — Ambulatory Visit (INDEPENDENT_AMBULATORY_CARE_PROVIDER_SITE_OTHER): Payer: Self-pay | Admitting: Family

## 2018-01-21 ENCOUNTER — Encounter: Payer: Self-pay | Admitting: General Practice

## 2018-01-21 ENCOUNTER — Ambulatory Visit: Payer: Self-pay | Admitting: Family

## 2018-01-21 VITALS — BP 118/82 | HR 78 | Temp 98.0°F | Resp 18

## 2018-01-21 DIAGNOSIS — N760 Acute vaginitis: Secondary | ICD-10-CM

## 2018-01-21 DIAGNOSIS — Z30432 Encounter for removal of intrauterine contraceptive device: Secondary | ICD-10-CM

## 2018-01-21 DIAGNOSIS — N898 Other specified noninflammatory disorders of vagina: Secondary | ICD-10-CM

## 2018-01-21 DIAGNOSIS — B379 Candidiasis, unspecified: Secondary | ICD-10-CM

## 2018-01-21 DIAGNOSIS — B373 Candidiasis of vulva and vagina: Secondary | ICD-10-CM

## 2018-01-21 DIAGNOSIS — Z113 Encounter for screening for infections with a predominantly sexual mode of transmission: Secondary | ICD-10-CM

## 2018-01-21 DIAGNOSIS — B9689 Other specified bacterial agents as the cause of diseases classified elsewhere: Secondary | ICD-10-CM

## 2018-01-21 NOTE — Patient Instructions (Signed)
Ethinyl Estradiol; Etonogestrel vaginal ring What is this medicine? ETHINYL ESTRADIOL; ETONOGESTREL (ETH in il es tra DYE ole; et oh noe JES trel) vaginal ring is a flexible, vaginal ring used as a contraceptive (birth control method). This medicine combines two types of female hormones, an estrogen and a progestin. This ring is used to prevent ovulation and pregnancy. Each ring is effective for one month. This medicine may be used for other purposes; ask your health care provider or pharmacist if you have questions. COMMON BRAND NAME(S): NuvaRing What should I tell my health care provider before I take this medicine? They need to know if you have or ever had any of these conditions: -abnormal vaginal bleeding -blood vessel disease or blood clots -breast, cervical, endometrial, ovarian, liver, or uterine cancer -diabetes -gallbladder disease -heart disease or recent heart attack -high blood pressure -high cholesterol -kidney disease -liver disease -migraine headaches -stroke -systemic lupus erythematosus (SLE) -tobacco smoker -an unusual or allergic reaction to estrogens, progestins, other medicines, foods, dyes, or preservatives -pregnant or trying to get pregnant -breast-feeding How should I use this medicine? Insert the ring into your vagina as directed. Follow the directions on the prescription label. The ring will remain place for 3 weeks and is then removed for a 1-week break. A new ring is inserted 1 week after the last ring was removed, on the same day of the week. Check often to make sure the ring is still in place, especially before and after sexual intercourse. If the ring was out of the vagina for an unknown amount of time, you may not be protected from pregnancy. Perform a pregnancy test and call your doctor. Do not use more often than directed. A patient package insert for the product will be given with each prescription and refill. Read this sheet carefully each time. The  sheet may change frequently. Contact your pediatrician regarding the use of this medicine in children. Special care may be needed. This medicine has been used in female children who have started having menstrual periods. Overdosage: If you think you have taken too much of this medicine contact a poison control center or emergency room at once. NOTE: This medicine is only for you. Do not share this medicine with others. What if I miss a dose? You will need to replace your vaginal ring once a month as directed. If the ring should slip out, or if you leave it in longer or shorter than you should, contact your health care professional for advice. What may interact with this medicine? Do not take this medicine with the following medication: -dasabuvir; ombitasvir; paritaprevir; ritonavir -ombitasvir; paritaprevir; ritonavir This medicine may also interact with the following medications: -acetaminophen -antibiotics or medicines for infections, especially rifampin, rifabutin, rifapentine, and griseofulvin, and possibly penicillins or tetracyclines -aprepitant -ascorbic acid (vitamin C) -atorvastatin -barbiturate medicines, such as phenobarbital -bosentan -carbamazepine -caffeine -clofibrate -cyclosporine -dantrolene -doxercalciferol -felbamate -grapefruit juice -hydrocortisone -medicines for anxiety or sleeping problems, such as diazepam or temazepam -medicines for diabetes, including pioglitazone -modafinil -mycophenolate -nefazodone -oxcarbazepine -phenytoin -prednisolone -ritonavir or other medicines for HIV infection or AIDS -rosuvastatin -selegiline -soy isoflavones supplements -St. John's wort -tamoxifen or raloxifene -theophylline -thyroid hormones -topiramate -warfarin This list may not describe all possible interactions. Give your health care provider a list of all the medicines, herbs, non-prescription drugs, or dietary supplements you use. Also tell them if you smoke,  drink alcohol, or use illegal drugs. Some items may interact with your medicine. What should I watch for while using   this medicine? Visit your doctor or health care professional for regular checks on your progress. You will need a regular breast and pelvic exam and Pap smear while on this medicine. Use an additional method of contraception during the first cycle that you use this ring. Do not use a diaphragm or female condom, as the ring can interfere with these birth control methods and their proper placement. If you have any reason to think you are pregnant, stop using this medicine right away and contact your doctor or health care professional. If you are using this medicine for hormone related problems, it may take several cycles of use to see improvement in your condition. Smoking increases the risk of getting a blood clot or having a stroke while you are using hormonal birth control, especially if you are more than 29 years old. You are strongly advised not to smoke. This medicine can make your body retain fluid, making your fingers, hands, or ankles swell. Your blood pressure can go up. Contact your doctor or health care professional if you feel you are retaining fluid. This medicine can make you more sensitive to the sun. Keep out of the sun. If you cannot avoid being in the sun, wear protective clothing and use sunscreen. Do not use sun lamps or tanning beds/booths. If you wear contact lenses and notice visual changes, or if the lenses begin to feel uncomfortable, consult your eye care specialist. In some women, tenderness, swelling, or minor bleeding of the gums may occur. Notify your dentist if this happens. Brushing and flossing your teeth regularly may help limit this. See your dentist regularly and inform your dentist of the medicines you are taking. If you are going to have elective surgery, you may need to stop using this medicine before the surgery. Consult your health care professional  for advice. This medicine does not protect you against HIV infection (AIDS) or any other sexually transmitted diseases. What side effects may I notice from receiving this medicine? Side effects that you should report to your doctor or health care professional as soon as possible: -breast tissue changes or discharge -changes in vaginal bleeding during your period or between your periods -chest pain -coughing up blood -dizziness or fainting spells -headaches or migraines -leg, arm or groin pain -severe or sudden headaches -stomach pain (severe) -sudden shortness of breath -sudden loss of coordination, especially on one side of the body -speech problems -symptoms of vaginal infection like itching, irritation or unusual discharge -tenderness in the upper abdomen -vomiting -weakness or numbness in the arms or legs, especially on one side of the body -yellowing of the eyes or skin Side effects that usually do not require medical attention (report to your doctor or health care professional if they continue or are bothersome): -breakthrough bleeding and spotting that continues beyond the 3 initial cycles of pills -breast tenderness -mood changes, anxiety, depression, frustration, anger, or emotional outbursts -increased sensitivity to sun or ultraviolet light -nausea -skin rash, acne, or brown spots on the skin -weight gain (slight) This list may not describe all possible side effects. Call your doctor for medical advice about side effects. You may report side effects to FDA at 1-800-FDA-1088. Where should I keep my medicine? Keep out of the reach of children. Store at room temperature between 15 and 30 degrees C (59 and 86 degrees F) for up to 4 months. The product will expire after 4 months. Protect from light. Throw away any unused medicine after the expiration date. NOTE: This   sheet is a summary. It may not cover all possible information. If you have questions about this medicine, talk  to your doctor, pharmacist, or health care provider.  2018 Elsevier/Gold Standard (2016-03-06 17:00:31)  

## 2018-01-21 NOTE — Progress Notes (Signed)
History:  [name] is a 27 y.o. who presents to clinic today for IUD removal for actinomyces. Partner, [name] here with patient.  Pt denies pain at this time, however due to the potential complications including PID and abscess decided to have device removed.  Reports returned vaginal discharge, desires screening.    The following portions of the patient's history were reviewed and updated as appropriate: allergies, current medications, family history, past medical history, social history, past surgical history and problem list.  Review of Systems:  Review of Systems  Constitutional: Negative for chills and fever.  Genitourinary: Positive for vaginal discharge. Negative for dysuria, pelvic pain and vaginal bleeding.  All other systems reviewed and are negative.      Objective:  Physical Exam BP 118/82 (BP Location: Right Arm)   Pulse 78   Temp 98 F (36.7 C) (Oral)   Resp 18  Physical Exam  Constitutional: She is oriented to person, place, and time. She appears well-developed and well-nourished.  HENT:  Head: Normocephalic.  Neck: Normal range of motion. Neck supple.  Abdominal: Soft. There is no tenderness.  Genitourinary: Cervix exhibits no motion tenderness, no discharge and no friability. Vaginal discharge found.  Genitourinary Comments: IUD string visualized at normal length  Neurological: She is alert and oriented to person, place, and time.  Skin: Skin is warm and dry.   Pt consented for IUD removal.  Pt placed in lithotomy position.  Speculum inserted and IUD removed without difficulty.   Speculum removed.   Labs and Imaging No results found for this or any previous visit (from the past 24 hour(s)).  No results found.   Assessment & Plan:  1. Vaginal discharge - Cervicovaginal ancillary only  2.  IUD Removal  Plan: Explained that pregnancy is possible beginning today;  Reviewed all family planning option; desires to try Nuva Ring Pt inserted Nuva Ring  at visit correctly Advised back up method x 2 weeks  Marlis EdelsonKarim, Walidah N, CNM 01/21/2018 9:00 AM

## 2018-01-24 LAB — CERVICOVAGINAL ANCILLARY ONLY
BACTERIAL VAGINITIS: POSITIVE — AB
Candida vaginitis: POSITIVE — AB
Trichomonas: NEGATIVE

## 2018-01-28 MED ORDER — METRONIDAZOLE 500 MG PO TABS: 500.0000 mg | ORAL_TABLET | Freq: Two times a day (BID) | ORAL | 0 refills | Status: DC

## 2018-01-28 MED ORDER — TERCONAZOLE 0.4 % VA CREA: 1.0000 | TOPICAL_CREAM | Freq: Every day | VAGINAL | 0 refills | Status: DC

## 2018-01-28 NOTE — Addendum Note (Signed)
Addended by: Marlis EdelsonKARIM, Porshe Fleagle N on: 01/28/2018 09:22 AM   Modules accepted: Orders

## 2018-02-15 ENCOUNTER — Ambulatory Visit (INDEPENDENT_AMBULATORY_CARE_PROVIDER_SITE_OTHER): Payer: Self-pay | Admitting: Physician Assistant

## 2018-02-23 ENCOUNTER — Encounter (INDEPENDENT_AMBULATORY_CARE_PROVIDER_SITE_OTHER): Payer: Self-pay | Admitting: Physician Assistant

## 2018-02-23 ENCOUNTER — Ambulatory Visit (INDEPENDENT_AMBULATORY_CARE_PROVIDER_SITE_OTHER): Payer: Self-pay | Admitting: Physician Assistant

## 2018-02-23 ENCOUNTER — Other Ambulatory Visit: Payer: Self-pay

## 2018-02-23 VITALS — BP 110/76 | HR 76 | Temp 98.3°F | Ht 61.0 in | Wt 161.6 lb

## 2018-02-23 DIAGNOSIS — E669 Obesity, unspecified: Secondary | ICD-10-CM

## 2018-02-23 DIAGNOSIS — Z6830 Body mass index (BMI) 30.0-30.9, adult: Secondary | ICD-10-CM

## 2018-02-23 DIAGNOSIS — F418 Other specified anxiety disorders: Secondary | ICD-10-CM

## 2018-02-23 DIAGNOSIS — E0590 Thyrotoxicosis, unspecified without thyrotoxic crisis or storm: Secondary | ICD-10-CM

## 2018-02-23 MED ORDER — PHENTERMINE HCL 30 MG PO CAPS: 30.0000 mg | ORAL_CAPSULE | ORAL | 0 refills | Status: DC

## 2018-02-23 NOTE — Patient Instructions (Signed)
Please call [facility] to schedule your appointment, [phone]

## 2018-02-23 NOTE — Progress Notes (Signed)
Subjective:  Patient ID: Nicole Keller, female    DOB: 03/13/1991  Age: 27 y.o. MRN: [id number]  CC:   HPI  Quitman LivingsYesica Zunigais a 27 y.o.femalewith a PMH of depression, anxiety, tachycardia, hyperthyroidism, and dishidrotic eczema presentsto f/uon obesity. Weighed 164 nearly six weeks ago. Prescribed phentermine 30 mg one tab daily x30 days. Keeping a healthy diet. Goes to the Gym and exercises four times a week. Has not had any episodes of presyncope, syncope, chest pain, palpitations, SOB, HA, abdominal pain, f/c/n/v, rash, edema, or GI/GU sxs.     Outpatient Medications Prior to Visit  Medication Sig Dispense Refill  . carvedilol (COREG) 6.25 MG tablet TAKE 1 TABLET BY MOUTH TWICE A DAY WITH A MEAL 180 tablet 1  . escitalopram (LEXAPRO) 20 MG tablet Take 1 tablet (20 mg total) by mouth daily. 90 tablet 0  . hydrOXYzine (ATARAX/VISTARIL) 10 MG tablet Take 1 tablet (10 mg total) by mouth 2 (two) times daily. 60 tablet 2  . methimazole (TAPAZOLE) 5 MG tablet Take 1 tablet (5 mg total) by mouth 3 (three) times daily. 90 tablet 3  . phentermine 30 MG capsule Take 1 capsule (30 mg total) by mouth every morning. 30 capsule 0  . hydrocortisone-pramoxine (PROCTOFOAM HC) rectal foam Place 1 applicator rectally 2 (two) times daily. (Patient not taking: Reported on 12/31/2017) 10 g 0  . metroNIDAZOLE (FLAGYL) 500 MG tablet Take 1 tablet (500 mg total) by mouth 2 (two) times daily. 14 tablet 0  . terconazole (TERAZOL 7) 0.4 % vaginal cream Place 1 applicator vaginally at bedtime. 45 g 0   No facility-administered medications prior to visit.      ROS Review of Systems  Constitutional: Negative for chills, fever and malaise/fatigue.  Eyes: Negative for blurred vision.  Respiratory: Negative for shortness of breath.   Cardiovascular: Negative for chest pain and palpitations.  Gastrointestinal: Negative for abdominal pain and nausea.  Genitourinary: Negative for dysuria and hematuria.   Musculoskeletal: Negative for joint pain and myalgias.  Skin: Negative for rash.  Neurological: Negative for tingling and headaches.  Psychiatric/Behavioral: Negative for depression. The patient is not nervous/anxious.     Objective:  BP 110/76 (BP Location: Left Arm, Patient Position: Sitting, Cuff Size: Normal)   Pulse 76   Temp 98.3 F (36.8 C) (Oral)   Ht 5\' 1"  (1.549 m)   Wt 161 lb 9.6 oz (73.3 kg)   SpO2 99%   BMI 30.53 kg/m   BP/Weight 02/23/2018 01/21/2018 01/12/2018  Systolic BP 110 118 109  Diastolic BP 76 82 74  Wt. (Lbs) 161.6 - 164  BMI 30.53 - 30.99      Physical Exam  Constitutional: She is oriented to person, place, and time.  Well developed, well nourished, NAD, polite  HENT:  Head: Normocephalic and atraumatic.  Eyes: No scleral icterus.  Neck: Normal range of motion. Neck supple. No thyromegaly present.  Cardiovascular: Normal rate, regular rhythm and normal heart sounds. Exam reveals no friction rub.  No murmur heard. No LE edema bilaterally  Pulmonary/Chest: Effort normal and breath sounds normal.  Musculoskeletal: She exhibits no edema.  Neurological: She is alert and oriented to person, place, and time.  Skin: Skin is warm and dry. No rash noted. No erythema. No pallor.  Psychiatric: She has a normal mood and affect. Her behavior is normal. Thought content normal.  Vitals reviewed.    Assessment & Plan:    1. Hyperthyroidism - Thyroid Panel With TSH  2. Class 1  obesity with serious comorbidity and body mass index (BMI) of 30.0 to 30.9 in adult, unspecified obesity type - 2nd Rx for phentermine 30 MG capsule; Take 1 capsule (30 mg total) by mouth every morning.  Dispense: 30 capsule; Refill: 0  3. Depression with anxiety - Begin to down titrate Escitalopram 20 mg. Pt instructed to take half tab for one week, then 1/4 tab for one week, lastly 1/4 tab every other day for one week.    Meds ordered this encounter  Medications  . phentermine  30 MG capsule    Sig: Take 1 capsule (30 mg total) by mouth every morning.    Dispense:  30 capsule    Refill:  0    Order Specific Question:   Supervising Provider    Answer:   Hoy RegisterNEWLIN, ENOBONG [[id number]]    Follow-up: Return in about 4 weeks (around 03/23/2018) for obesity.   Loletta Specteroger David Leza Apsey PA

## 2018-02-24 LAB — THYROID PANEL WITH TSH
FREE THYROXINE INDEX: 2.7 (ref 1.2–4.9)
T3 UPTAKE RATIO: 32 % (ref 24–39)
T4, Total: 8.4 ug/dL (ref 4.5–12.0)
TSH: <0.006 u[IU]/mL — ABNORMAL LOW (ref 0.450–4.500)

## 2018-02-25 ENCOUNTER — Telehealth (INDEPENDENT_AMBULATORY_CARE_PROVIDER_SITE_OTHER): Payer: Self-pay

## 2018-02-25 NOTE — Telephone Encounter (Signed)
-----   Message from Loletta Specteroger David Gomez, PA-C sent at 02/24/2018  6:00 PM EDT ----- Still shows some TSH suppression. Keep appointment with Endocrinologist.

## 2018-02-25 NOTE — Telephone Encounter (Signed)
Patient is aware that Thyroid panel still shows some TSH suppression. Encouraged to keep appointments with Endocrinologist. Patient stated her first appointment has been scheduled for 9/18. Maryjean Mornemp[address]t [address] [place] Viereck, CMA

## 2018-03-28 ENCOUNTER — Ambulatory Visit (INDEPENDENT_AMBULATORY_CARE_PROVIDER_SITE_OTHER): Payer: Self-pay | Admitting: Physician Assistant

## 2018-03-30 ENCOUNTER — Encounter: Payer: Self-pay | Admitting: Endocrinology

## 2018-03-30 ENCOUNTER — Ambulatory Visit (INDEPENDENT_AMBULATORY_CARE_PROVIDER_SITE_OTHER): Payer: Self-pay | Admitting: Endocrinology

## 2018-03-30 VITALS — BP 122/78 | HR 76 | Ht 61.0 in | Wt 156.6 lb

## 2018-03-30 DIAGNOSIS — E0590 Thyrotoxicosis, unspecified without thyrotoxic crisis or storm: Secondary | ICD-10-CM

## 2018-03-30 NOTE — Progress Notes (Signed)
Subjective:    Patient ID: Nicole Keller, female    DOB: 1990-12-12, 27 y.o.   MRN: [id number]  HPI Pt is referred by Sindy Messing, PA, for hyperthyroidism.  Pt reports she was dx'ed with hyperthyroidism in 2018.  she has been on methimazole intermittently since then.  she has never had XRT to the anterior neck, or thyroid surgery. she does not consume kelp or any other non-prescribed thyroid medication.  she has never been on amiodarone.  She reports moderate anxiety, and assoc doe.   Past Medical History:  Diagnosis Date  . Hypertension   . Hyperthyroidism     No past surgical history on file.  Social History   Socioeconomic History  . Marital status: Single    Spouse name: Not on file  . Number of children: 2  . Years of education: Not on file  . Highest education level: Not on file  Occupational History  . Not on file  Social Needs  . Financial resource strain: Somewhat hard  . Food insecurity:    Worry: Never true    Inability: Never true  . Transportation needs:    Medical: No    Non-medical: No  Tobacco Use  . Smoking status: Never Smoker  . Smokeless tobacco: Never Used  Substance and Sexual Activity  . Alcohol use: Yes  . Drug use: No  . Sexual activity: Yes    Partners: Male    Birth control/protection: IUD  Lifestyle  . Physical activity:    Days per week: Not on file    Minutes per session: Not on file  . Stress: Not on file  Relationships  . Social connections:    Talks on phone: Not on file    Gets together: Not on file    Attends religious service: Not on file    Active member of club or organization: Not on file    Attends meetings of clubs or organizations: Not on file    Relationship status: Not on file  . Intimate partner violence:    Fear of current or ex partner: Not on file    Emotionally abused: Not on file    Physically abused: Not on file    Forced sexual activity: Not on file  Other Topics Concern  . Not on file  Social History  Narrative  . Not on file    Current Outpatient Medications on File Prior to Visit  Medication Sig Dispense Refill  . carvedilol (COREG) 6.25 MG tablet TAKE 1 TABLET BY MOUTH TWICE A DAY WITH A MEAL 180 tablet 1   No current facility-administered medications on file prior to visit.     No Known Allergies  Family History  Problem Relation Age of Onset  . Hypertension Mother   . Thyroid disease Mother   . Thyroid disease Sister     BP 122/78 (BP Location: Right Arm, Patient Position: Sitting)   Pulse 76   Ht 5\' 1"  (1.549 m)   Wt 156 lb 9.6 oz (71 kg)   LMP 03/26/2018   SpO2 97%   BMI 29.59 kg/m     Review of Systems denies headache, hoarseness, diplopia, palpitations, diarrhea, polyuria, muscle weakness, edema, excessive diaphoresis, tremor, easy bruising, and rhinorrhea. She has lost a few lbs recently.  She has heat intolerance.       Objective:   Physical Exam VS: see vs page GEN: no distress HEAD: head: no deformity eyes: no periorbital swelling, no proptosis external nose and ears  are normal mouth: no lesion seen NECK: thyroid is slightly and diffusely enlarged CHEST WALL: no deformity LUNGS: clear to auscultation CV: reg rate and rhythm, no murmur ABD: abdomen is soft, nontender.  no hepatosplenomegaly.  not distended.  no hernia MUSCULOSKELETAL: muscle bulk and strength are grossly normal.  no obvious joint swelling.  gait is normal and steady EXTEMITIES: no deformity.  no ulcer on the feet.  feet are of normal color and temp.  no edema PULSES: dorsalis pedis intact bilat.  no carotid bruit NEURO:  cn 2-12 grossly intact.   readily moves all 4's.  sensation is intact to touch on the feet.  No tremor SKIN:  Normal texture and temperature.  No rash or suspicious lesion is visible. Not diaphoretic  NODES:  None palpable at the neck PSYCH: alert, well-oriented.  Does not appear anxious nor depressed.   Diffuse mild enlargement, heterogeneity, and  hypervascularity of the thyroid gland without discrete nodule. Appearance is suggestive of Graves disease or thyroiditis. No adenopathy. No discrete nodule.  Lab Results  Component Value Date   TSH <0.006 (L) 02/23/2018   T4TOTAL 8.4 02/23/2018   I have reviewed outside records, and summarized: Pt was noted to have hyperthyroidism, and referred here.  She was rx'ed methimazole, but had persistently abnormal TFT, and anxiety symptoms.      Assessment & Plan:  Hyperthyroidism, new to me.  Due to Grave's Dz. We discussed rx options.  She chooses RAI.   Patient Instructions  In view of your medical condition, you should avoid pregnancy until we have decided it is safe. Please stop taking the methimazole, but continue the carvedilol.  let's check a thyroid "scan" (a special, but easy and painless type of thyroid x ray).  It works like this: you go to the x-ray department of the hospital to swallow a pill, which contains a miniscule amount of radiation.  You will not notice any symptoms from this.  You will go back to the x-ray department the next day, to lie down in front of a camera.  The results of this will be sent to me.   Based on the results, i hope to order for you a treatment pill of radioactive iodine.  Although it is a larger amount of radiation, you will again notice no symptoms from this.  The pill is gone from your body in a few days (during which you should stay away from other people), but takes several months to work.  Therefore, please return here approximately 6-8 weeks after the treatment.  This treatment has been available for many years, and the only known side-effect is an underactive thyroid.  It is possible that i would eventually prescribe for you a thyroid hormone pill, which is very inexpensive.  You don't have to worry about side-effects of this thyroid hormone pill, because it is the same molecule your thyroid makes.  En vista de su condicin mdica, debe evitar el  embarazo hasta que hayamos decidido que es seguro. Por favor, deje de tomar el metimazol, pero contine con el carvedilol. revisemos un "escner" de tiroides (un tipo especial, pero fcil e indoloro de rayos X de tiroides). Funciona as: Roselyn Bering al departamento de [facility] del hospital para tragar Janyth Pupa, que contiene una cantidad [place] de radiacin. No notar ningn sntoma de esto. Regresar al departamento de rayos X al da siguiente para acostarse frente a Secretary/administrator. Los resultados de esto me sern enviados. Segn los [place], espero ordenarle [place]  pldora de tratamiento con yodo radioactivo. Aunque es una mayor cantidad de radiacin, nuevamente no notar sntomas de esto. La pldora desaparece de su cuerpo en unos pocos das (durante los cuales debe mantenerse alejado de otras personas), pero tarda varios meses en funcionar. Por lo tanto, regrese aqu aproximadamente 6-8 semanas despus del tratamiento. Este tratamiento ha estado disponible durante muchos aos, y el nico efecto secundario conocido es una tiroides poco activa.Printmaker Es posible que eventualmente le recete una pldora de hormona tiroidea, que es [place] econmica. No tiene que preocuparse por los efectos secundarios de esta pldora de hormona tiroidea, porque es la misma molcula que produce su tiroides.

## 2018-03-30 NOTE — Patient Instructions (Addendum)
In view of your medical condition, you should avoid pregnancy until we have decided it is safe. Please stop taking the methimazole, but continue the carvedilol.  let's check a thyroid "scan" (a special, but easy and painless type of thyroid x ray).  It works like this: you go to the x-ray department of the hospital to swallow a pill, which contains a miniscule amount of radiation.  You will not notice any symptoms from this.  You will go back to the x-ray department the next day, to lie down in front of a camera.  The results of this will be sent to me.   Based on the results, i hope to order for you a treatment pill of radioactive iodine.  Although it is a larger amount of radiation, you will again notice no symptoms from this.  The pill is gone from your body in a few days (during which you should stay away from other people), but takes several months to work.  Therefore, please return here approximately 6-8 weeks after the treatment.  This treatment has been available for many years, and the only known side-effect is an underactive thyroid.  It is possible that i would eventually prescribe for you a thyroid hormone pill, which is very inexpensive.  You don't have to worry about side-effects of this thyroid hormone pill, because it is the same molecule your thyroid makes.  En vista de su condicin mdica, debe evitar el embarazo hasta que hayamos decidido que es seguro. Por favor, deje de tomar el metimazol, pero contine con el carvedilol. revisemos un "escner" de tiroides (un tipo especial, pero fcil e indoloro de rayos X de tiroides). Funciona as: Roselyn Beringusted va al departamento de [facility] del hospital para tragar Janyth Pupauna pldora, que contiene una cantidad [place] de radiacin. No notar ningn sntoma de esto. Regresar al departamento de rayos X al da siguiente para acostarse frente a Secretary/administratoruna cmara. Los resultados de esto me sern enviados. Segn los [place], espero ordenarle una pldora de tratamiento con  yodo radioactivo. Aunque es una mayor cantidad de radiacin, nuevamente no notar sntomas de esto. La pldora desaparece de su cuerpo en unos pocos das (durante los cuales debe mantenerse alejado de otras personas), pero tarda varios meses en funcionar. Por lo tanto, regrese aqu aproximadamente 6-8 semanas despus del tratamiento. Este tratamiento ha estado disponible durante muchos aos, y el nico efecto secundario conocido es una tiroides poco Printmakeractiva. Es posible que eventualmente le recete una pldora de hormona tiroidea, que es [place] econmica. No tiene que preocuparse por los efectos secundarios de esta pldora de hormona tiroidea, porque es la misma molcula que produce su tiroides.

## 2018-04-11 ENCOUNTER — Ambulatory Visit: Payer: Self-pay | Attending: Family Medicine

## 2018-04-16 ENCOUNTER — Other Ambulatory Visit (INDEPENDENT_AMBULATORY_CARE_PROVIDER_SITE_OTHER): Payer: Self-pay | Admitting: Physician Assistant

## 2018-04-16 DIAGNOSIS — E0590 Thyrotoxicosis, unspecified without thyrotoxic crisis or storm: Secondary | ICD-10-CM

## 2018-04-18 NOTE — Telephone Encounter (Signed)
FWD to PCP. Tempestt S Roberts, CMA  

## 2018-04-25 ENCOUNTER — Ambulatory Visit (INDEPENDENT_AMBULATORY_CARE_PROVIDER_SITE_OTHER): Payer: Self-pay | Admitting: Physician Assistant

## 2018-05-04 ENCOUNTER — Ambulatory Visit (HOSPITAL_COMMUNITY)
Admission: RE | Admit: 2018-05-04 | Discharge: 2018-05-04 | Disposition: A | Discharge status: Home / Self Care | Payer: Self-pay | Source: Ambulatory Visit | Attending: Endocrinology | Admitting: Endocrinology

## 2018-05-04 DIAGNOSIS — R9389 Abnormal findings on diagnostic imaging of other specified body structures: Secondary | ICD-10-CM | POA: Insufficient documentation

## 2018-05-04 DIAGNOSIS — E0590 Thyrotoxicosis, unspecified without thyrotoxic crisis or storm: Secondary | ICD-10-CM | POA: Insufficient documentation

## 2018-05-04 MED ORDER — SODIUM IODIDE I-123 7.4 MBQ CAPS
415.0000 | ORAL_CAPSULE | Freq: Once | ORAL | Status: AC
  Administered 2018-05-04: 415 via ORAL

## 2018-05-05 ENCOUNTER — Other Ambulatory Visit: Payer: Self-pay | Admitting: Endocrinology

## 2018-05-05 ENCOUNTER — Ambulatory Visit (HOSPITAL_COMMUNITY)
Admission: RE | Admit: 2018-05-05 | Discharge: 2018-05-05 | Disposition: A | Discharge status: Home / Self Care | Payer: Self-pay | Source: Ambulatory Visit | Attending: Endocrinology | Admitting: Endocrinology

## 2018-05-05 DIAGNOSIS — E0590 Thyrotoxicosis, unspecified without thyrotoxic crisis or storm: Secondary | ICD-10-CM

## 2018-05-06 ENCOUNTER — Telehealth: Payer: Self-pay

## 2018-05-06 NOTE — Telephone Encounter (Signed)
RAI uptake completed. Letter mailed to pt home address requesting she call our office to schedule an appt 6-8 weeks AFTER 05/05/18 (date RAI Uptake was completed). Will await her call.

## 2018-05-12 ENCOUNTER — Telehealth: Payer: Self-pay | Admitting: Endocrinology

## 2018-05-12 NOTE — Telephone Encounter (Signed)
Returned pt call. Pt informed me that she has not yet had her RAI tx't, only had scans completed. Stated she was informed by [facility] that they will call her to schedule a time for her to come in to take the medication and complete the remainder of the test. Advised pt to call our office for an appt 6-8 weeks AFTER she completes the remainder of her tx't. Verbalized acceptance and understanding.

## 2018-05-12 NOTE — Telephone Encounter (Signed)
Patient requests to be called at ph# [phone] re: her lab results

## 2018-05-19 ENCOUNTER — Telehealth: Payer: Self-pay | Admitting: Endocrinology

## 2018-05-19 ENCOUNTER — Ambulatory Visit (HOSPITAL_COMMUNITY)
Admission: RE | Admit: 2018-05-19 | Discharge: 2018-05-19 | Disposition: A | Discharge status: Home / Self Care | Payer: Self-pay | Source: Ambulatory Visit | Attending: Endocrinology | Admitting: Endocrinology

## 2018-05-19 DIAGNOSIS — E0590 Thyrotoxicosis, unspecified without thyrotoxic crisis or storm: Secondary | ICD-10-CM | POA: Insufficient documentation

## 2018-05-19 LAB — HCG, SERUM, QUALITATIVE: Preg, Serum: NEGATIVE

## 2018-05-19 MED ORDER — SODIUM IODIDE I 131 CAPSULE
18.0000 | Freq: Once | INTRAVENOUS | Status: AC | PRN
  Administered 2018-05-19: 18 via ORAL

## 2018-05-19 NOTE — Telephone Encounter (Signed)
Patient has taken radiation iodine pill and we will be scheduling an appointment 6 weeks from now.

## 2018-05-19 NOTE — Telephone Encounter (Signed)
FYI

## 2018-06-07 ENCOUNTER — Ambulatory Visit (INDEPENDENT_AMBULATORY_CARE_PROVIDER_SITE_OTHER): Payer: Self-pay | Admitting: Physician Assistant

## 2018-06-07 ENCOUNTER — Encounter (INDEPENDENT_AMBULATORY_CARE_PROVIDER_SITE_OTHER): Payer: Self-pay | Admitting: Physician Assistant

## 2018-06-07 VITALS — Ht 61.0 in | Wt 159.0 lb

## 2018-06-07 DIAGNOSIS — R000 Tachycardia, unspecified: Secondary | ICD-10-CM

## 2018-06-07 DIAGNOSIS — R079 Chest pain, unspecified: Secondary | ICD-10-CM

## 2018-06-07 MED ORDER — CARVEDILOL 6.25 MG PO TABS: ORAL_TABLET | ORAL | 1 refills | Status: DC

## 2018-06-07 NOTE — Patient Instructions (Signed)

## 2018-06-07 NOTE — Progress Notes (Signed)
   Subjective:  Patient ID: Nicole Keller, female    DOB: 02/08/1991  Age: 27 y.o. MRN: [id number]  CC: chest pain   HPI Nicole Keller is a 27 y.o. female with a medical history of hyperthyroidism, HTN, and palpitations presents to report an episode of left sided chest pain yesterday while exercising. She has run out of carvedilol since one month ago. Tachycardia originally thought to be attributed to hyperthyroidism. Of note, pt has completed radioactive iodine therapy for Graves disease on 05/19/18. Follow up appointment with endocrinologist on 06/27/18. Does not currently have chest pain. Does not endorse any other symptoms or complaints.        Outpatient Medications Prior to Visit  Medication Sig Dispense Refill  . carvedilol (COREG) 6.25 MG tablet TAKE 1 TABLET BY MOUTH TWICE A DAY WITH A MEAL 180 tablet 1   No facility-administered medications prior to visit.      ROS Review of Systems  Constitutional: Negative for chills, fever and malaise/fatigue.  Eyes: Negative for blurred vision.  Respiratory: Negative for shortness of breath.   Cardiovascular: Positive for chest pain. Negative for palpitations.  Gastrointestinal: Negative for abdominal pain and nausea.  Genitourinary: Negative for dysuria and hematuria.  Musculoskeletal: Negative for joint pain and myalgias.  Skin: Negative for rash.  Neurological: Negative for tingling and headaches.  Psychiatric/Behavioral: Negative for depression. The patient is not nervous/anxious.     Objective:  Ht 5\' 1"  (1.549 m)   Wt 159 lb (72.1 kg)   LMP 05/28/2018   BMI 30.04 kg/m   Vitals:   06/07/18 1343  Weight: 159 lb (72.1 kg)  Height: 5\' 1"  (1.549 m)     Physical Exam  Constitutional: She is oriented to person, place, and time.  Well developed, well nourished, NAD, polite  HENT:  Head: Normocephalic and atraumatic.  Eyes: No scleral icterus.  Neck: Normal range of motion. Neck supple. No thyromegaly present.   Cardiovascular: Normal rate, regular rhythm and normal heart sounds.  Pulmonary/Chest: Effort normal and breath sounds normal.  Musculoskeletal: She exhibits no edema.  Neurological: She is alert and oriented to person, place, and time.  Skin: Skin is warm and dry. No rash noted. No erythema. No pallor.  Psychiatric: She has a normal mood and affect. Her behavior is normal. Thought content normal.  Vitals reviewed.    Assessment & Plan:   1. Tachycardia - Ambulatory referral to Cardiology - carvedilol (COREG) 6.25 MG tablet; TAKE 1 TABLET BY MOUTH TWICE A DAY WITH A MEAL  Dispense: 180 tablet; Refill: 1 - EKG 12-Lead NSR  2. Chest pain, unspecified type - Ambulatory referral to Cardiology - carvedilol (COREG) 6.25 MG tablet; TAKE 1 TABLET BY MOUTH TWICE A DAY WITH A MEAL  Dispense: 180 tablet; Refill: 1 - EKG 12-Lead NSR   Meds ordered this encounter  Medications  . carvedilol (COREG) 6.25 MG tablet    Sig: TAKE 1 TABLET BY MOUTH TWICE A DAY WITH A MEAL    Dispense:  180 tablet    Refill:  1    Order Specific Question:   Supervising Provider    Answer:   Hoy RegisterNEWLIN, ENOBONG [[id number]]    Follow-up: Return in about 4 weeks (around 07/05/2018) for Chest pain and f/u hyperthyroidism.   Loletta Specteroger David Sueko Dimichele PA

## 2018-06-27 ENCOUNTER — Encounter: Payer: Self-pay | Admitting: Endocrinology

## 2018-06-27 ENCOUNTER — Ambulatory Visit (INDEPENDENT_AMBULATORY_CARE_PROVIDER_SITE_OTHER): Payer: Self-pay | Admitting: Endocrinology

## 2018-06-27 VITALS — BP 120/78 | HR 77 | Ht 61.0 in | Wt 157.4 lb

## 2018-06-27 DIAGNOSIS — E0590 Thyrotoxicosis, unspecified without thyrotoxic crisis or storm: Secondary | ICD-10-CM

## 2018-06-27 LAB — T4, FREE: FREE T4: 1.93 ng/dL — AB (ref 0.60–1.60)

## 2018-06-27 LAB — TSH

## 2018-06-27 NOTE — Progress Notes (Signed)
Subjective:    Patient ID: Nicole Keller, female    DOB: 11/23/1990, 27 y.o.   MRN: [id number]  HPI Pt returns for f/u of hyperthyroidism (due to Grave's Dz; dx'ed 2018; she took methimazole intermittently 2018-2019; she took RAI in 11/19).  pt states she feels well in general.  Pt says she is not considering another pregnancy.   Past Medical History:  Diagnosis Date  . Hypertension   . Hyperthyroidism     No past surgical history on file.  Social History   Socioeconomic History  . Marital status: Single    Spouse name: Not on file  . Number of children: 2  . Years of education: Not on file  . Highest education level: Not on file  Occupational History  . Not on file  Social Needs  . Financial resource strain: Somewhat hard  . Food insecurity:    Worry: Never true    Inability: Never true  . Transportation needs:    Medical: No    Non-medical: No  Tobacco Use  . Smoking status: Never Smoker  . Smokeless tobacco: Never Used  Substance and Sexual Activity  . Alcohol use: Yes  . Drug use: No  . Sexual activity: Yes    Partners: Male    Birth control/protection: I.U.D.  Lifestyle  . Physical activity:    Days per week: Not on file    Minutes per session: Not on file  . Stress: Not on file  Relationships  . Social connections:    Talks on phone: Not on file    Gets together: Not on file    Attends religious service: Not on file    Active member of club or organization: Not on file    Attends meetings of clubs or organizations: Not on file    Relationship status: Not on file  . Intimate partner violence:    Fear of current or ex partner: Not on file    Emotionally abused: Not on file    Physically abused: Not on file    Forced sexual activity: Not on file  Other Topics Concern  . Not on file  Social History Narrative  . Not on file    Current Outpatient Medications on File Prior to Visit  Medication Sig Dispense Refill  . carvedilol (COREG) 6.25 MG tablet  TAKE 1 TABLET BY MOUTH TWICE A DAY WITH A MEAL 180 tablet 1   No current facility-administered medications on file prior to visit.     No Known Allergies  Family History  Problem Relation Age of Onset  . Hypertension Mother   . Thyroid disease Mother   . Thyroid disease Sister     BP 120/78 (BP Location: Left Arm, Patient Position: Sitting, Cuff Size: Normal)   Pulse 77   Ht 5\' 1"  (1.549 m)   Wt 157 lb 6.4 oz (71.4 kg)   LMP 05/28/2018   SpO2 98%   BMI 29.74 kg/m   Review of Systems Denies leg swelling.      Objective:   Physical Exam VITAL SIGNS:  See vs page GENERAL: no distress NECK: thyroid is slight and diffusely enlarged.    Lab Results  Component Value Date   TSH <0.01 Repeated and verified X2. (L) 06/27/2018   T4TOTAL 8.4 02/23/2018      Assessment & Plan:  Hyperthyroidism: not improved yet.  Tachycardia: well-controlled on b-blocker.   Patient Instructions  blood tests are requested for you today.  We'll let you know  about the results. Please come back for a follow-up appointment in 1 month.

## 2018-06-27 NOTE — Patient Instructions (Addendum)
blood tests are requested for you today.  We'll let you know about the results.  Please come back for a follow-up appointment in 1 month.  

## 2018-07-14 ENCOUNTER — Encounter (INDEPENDENT_AMBULATORY_CARE_PROVIDER_SITE_OTHER): Payer: Self-pay | Admitting: Physician Assistant

## 2018-07-14 ENCOUNTER — Ambulatory Visit (INDEPENDENT_AMBULATORY_CARE_PROVIDER_SITE_OTHER): Payer: Self-pay | Admitting: Physician Assistant

## 2018-07-14 VITALS — BP 113/75 | HR 58 | Temp 97.8°F | Ht 61.0 in | Wt 161.4 lb

## 2018-07-14 DIAGNOSIS — R000 Tachycardia, unspecified: Secondary | ICD-10-CM

## 2018-07-14 DIAGNOSIS — E0590 Thyrotoxicosis, unspecified without thyrotoxic crisis or storm: Secondary | ICD-10-CM

## 2018-07-14 NOTE — Progress Notes (Signed)
Subjective:  Patient ID: Nicole Keller, female    DOB: 1991-03-12  Age: 28 y.o. MRN: [id number]  CC: chest pain and tachycardia f/u.   HPI Nicole Keller is a 28 y.o. female with a medical history of hyperthyroidism, HTN, and palpitations presents to f/u on tachycardia and chest pain. Last seen here approximately 5 weeks ago. EKG was NSR. Prescribed a refill of carvediolol 6.25 mg and referred to cardiology. Had reported left sided chest pain with cardio at the gym. Had also felt CP while at rest but "maybe only once". Taking carvedilol as directed. Has not felt chest pain, palpitations, or tachycardia since her last visit here. Endocrinologist plans to discontinue Carvedilol once thyroid is stable. Does not endorse menstrual irregularities, fatigue, GI sxs, dry skin, hair loss, or mood disorder.      Outpatient Medications Prior to Visit  Medication Sig Dispense Refill  . carvedilol (COREG) 6.25 MG tablet TAKE 1 TABLET BY MOUTH TWICE A DAY WITH A MEAL 180 tablet 1   No facility-administered medications prior to visit.      ROS Review of Systems  Constitutional: Negative for chills, fever and malaise/fatigue.  Eyes: Negative for blurred vision.  Respiratory: Negative for shortness of breath.   Cardiovascular: Negative for chest pain and palpitations.  Gastrointestinal: Negative for abdominal pain and nausea.  Genitourinary: Negative for dysuria and hematuria.  Musculoskeletal: Negative for joint pain and myalgias.  Skin: Negative for rash.  Neurological: Negative for tingling and headaches.  Psychiatric/Behavioral: Negative for depression. The patient is not nervous/anxious.     Objective:  BP 113/75 (BP Location: Left Arm, Patient Position: Sitting, Cuff Size: Normal)   Pulse (!) 58   Temp 97.8 F (36.6 C) (Oral)   Ht 5\' 1"  (1.549 m)   Wt 161 lb 6.4 oz (73.2 kg)   LMP 06/27/2018 (Exact Date)   SpO2 97%   BMI 30.50 kg/m   BP/Weight 07/14/2018 06/27/2018 06/07/2018   Systolic BP 113 120 -  Diastolic BP 75 78 -  Wt. (Lbs) 161.4 157.4 159  BMI 30.5 29.74 30.04      Physical Exam Vitals signs reviewed.  Constitutional:      Comments: Well developed, well nourished, NAD, polite  HENT:     Head: Normocephalic and atraumatic.  Eyes:     General: No scleral icterus. Neck:     Musculoskeletal: Normal range of motion and neck supple.     Thyroid: No thyromegaly.  Cardiovascular:     Rate and Rhythm: Normal rate and regular rhythm.     Heart sounds: Normal heart sounds.  Pulmonary:     Effort: Pulmonary effort is normal.     Breath sounds: Normal breath sounds.  Skin:    General: Skin is warm and dry.     Coloration: Skin is not pale.     Findings: No erythema or rash.  Neurological:     Mental Status: She is alert and oriented to person, place, and time.  Psychiatric:        Behavior: Behavior normal.        Thought Content: Thought content normal.      Assessment & Plan:    1. Hyperthyroidism - Pt has received radioiodine treatment and is currently managed by endocrinology.   2. Tachycardia - Possibly related to hyperthyroidism. Endocrinologist plans to discontinue Carvedilol once thyroid is stable.    Follow-up: Return in about 4 months (around 11/12/2018) for Thyroid and tachycardia.   Loletta Specter PA

## 2018-07-14 NOTE — Patient Instructions (Signed)

## 2018-07-28 ENCOUNTER — Ambulatory Visit (INDEPENDENT_AMBULATORY_CARE_PROVIDER_SITE_OTHER): Payer: Self-pay | Admitting: Endocrinology

## 2018-07-28 ENCOUNTER — Encounter: Payer: Self-pay | Admitting: Endocrinology

## 2018-07-28 VITALS — BP 104/82 | HR 58 | Ht 61.0 in | Wt 161.0 lb

## 2018-07-28 DIAGNOSIS — E0590 Thyrotoxicosis, unspecified without thyrotoxic crisis or storm: Secondary | ICD-10-CM

## 2018-07-28 DIAGNOSIS — R000 Tachycardia, unspecified: Secondary | ICD-10-CM

## 2018-07-28 DIAGNOSIS — R079 Chest pain, unspecified: Secondary | ICD-10-CM

## 2018-07-28 MED ORDER — CARVEDILOL 6.25 MG PO TABS: 3.1250 mg | ORAL_TABLET | Freq: Two times a day (BID) | ORAL | 1 refills | Status: DC

## 2018-07-28 NOTE — Patient Instructions (Addendum)
blood tests are requested for you today.  We'll let you know about the results. Please reduce the carvedilol to 1/2 pill, twice a day.  Please come back for a follow-up appointment in 1 month.

## 2018-07-28 NOTE — Progress Notes (Signed)
   Subjective:    Patient ID: Nicole Keller, female    DOB: Jan 30, 1991, 30 y.o.   MRN: [id number]  HPI Pt returns for f/u of hyperthyroidism (due to Grave's Dz; dx'ed 2018; she took methimazole intermittently 2018-2019; she took RAI in 11/19).  pt states she feels well in general, except for headache.  Pt says she is not considering another pregnancy.   Past Medical History:  Diagnosis Date  . Hypertension   . Hyperthyroidism     No past surgical history on file.  Social History   Socioeconomic History  . Marital status: Single    Spouse name: Not on file  . Number of children: 2  . Years of education: Not on file  . Highest education level: Not on file  Occupational History  . Not on file  Social Needs  . Financial resource strain: Somewhat hard  . Food insecurity:    Worry: Never true    Inability: Never true  . Transportation needs:    Medical: No    Non-medical: No  Tobacco Use  . Smoking status: Never Smoker  . Smokeless tobacco: Never Used  Substance and Sexual Activity  . Alcohol use: Yes  . Drug use: No  . Sexual activity: Yes    Partners: Male    Birth control/protection: I.U.D.  Lifestyle  . Physical activity:    Days per week: Not on file    Minutes per session: Not on file  . Stress: Not on file  Relationships  . Social connections:    Talks on phone: Not on file    Gets together: Not on file    Attends religious service: Not on file    Active member of club or organization: Not on file    Attends meetings of clubs or organizations: Not on file    Relationship status: Not on file  . Intimate partner violence:    Fear of current or ex partner: Not on file    Emotionally abused: Not on file    Physically abused: Not on file    Forced sexual activity: Not on file  Other Topics Concern  . Not on file  Social History Narrative  . Not on file    No current outpatient medications on file prior to visit.   No current facility-administered  medications on file prior to visit.     No Known Allergies  Family History  Problem Relation Age of Onset  . Hypertension Mother   . Thyroid disease Mother   . Thyroid disease Sister     BP 104/82 (BP Location: Right Arm, Patient Position: Sitting, Cuff Size: Normal)   Pulse (!) 58   Ht 5\' 1"  (1.549 m)   Wt 161 lb (73 kg)   SpO2 98%   BMI 30.42 kg/m    Review of Systems She has gained a few lbs.      Objective:   Physical Exam VITAL SIGNS:  See vs page GENERAL: no distress NECK: There is no palpable thyroid enlargement.  No thyroid nodule is palpable.  No palpable lymphadenopathy at the anterior neck.    Lab Results  Component Value Date   TSH 10.76 (H) 07/28/2018   T4TOTAL 8.4 02/23/2018      Assessment & Plan:  Post-RAI hypothyroidism, new.  I have sent a prescription to your pharmacy, for synthroid.

## 2018-07-29 LAB — TSH: TSH: 10.76 u[IU]/mL — AB (ref 0.35–4.50)

## 2018-07-29 LAB — T4, FREE: FREE T4: 0.32 ng/dL — AB (ref 0.60–1.60)

## 2018-07-29 MED ORDER — LEVOTHYROXINE SODIUM 100 MCG PO TABS: 100.0000 ug | ORAL_TABLET | Freq: Every day | ORAL | 3 refills | Status: DC

## 2018-08-09 ENCOUNTER — Ambulatory Visit (INDEPENDENT_AMBULATORY_CARE_PROVIDER_SITE_OTHER): Payer: Self-pay | Admitting: Cardiovascular Disease

## 2018-08-09 ENCOUNTER — Encounter: Payer: Self-pay | Admitting: Cardiovascular Disease

## 2018-08-09 VITALS — BP 112/86 | HR 65 | Ht 61.0 in | Wt 160.0 lb

## 2018-08-09 DIAGNOSIS — R000 Tachycardia, unspecified: Secondary | ICD-10-CM

## 2018-08-09 DIAGNOSIS — R002 Palpitations: Secondary | ICD-10-CM

## 2018-08-09 DIAGNOSIS — I10 Essential (primary) hypertension: Secondary | ICD-10-CM

## 2018-08-09 HISTORY — DX: Essential (primary) hypertension: I10

## 2018-08-09 NOTE — Progress Notes (Signed)
08/09/2018 Nicole Keller   07/29/1990  [id number]  Primary Physician Nicole Specter, PA-C Primary Cardiologist: Runell Gess MD Nicole Keller  HPI:  Nicole Keller is a 28 y.o. moderately overweight single female mother of 2 children who works as a Corporate investment banker.  She was referred by Nicole Specter, PA-C for evaluation of hypertension and tachycardia.  She has no cardiac risk factors.  She is never had a heart attack or stroke.  There is no family history of heart disease.  She has had hypertension and palpitations for several months and was noted to be hyperthyroid.  She underwent radioactive iodine ablation 2 months ago.  Her symptoms have somewhat improved.  She does drink occasional caffeinated beverages.   Current Meds  Medication Sig  . carvedilol (COREG) 6.25 MG tablet Take 0.5 tablets (3.125 mg total) by mouth 2 (two) times daily with a meal. TAKE 1 TABLET BY MOUTH TWICE A DAY WITH A MEAL  . levothyroxine (SYNTHROID, LEVOTHROID) 100 MCG tablet Take 1 tablet (100 mcg total) by mouth daily before breakfast.     No Known Allergies  Social History   Socioeconomic History  . Marital status: Single    Spouse name: Not on file  . Number of children: 2  . Years of education: Not on file  . Highest education level: Not on file  Occupational History  . Not on file  Social Needs  . Financial resource strain: Somewhat hard  . Food insecurity:    Worry: Never true    Inability: Never true  . Transportation needs:    Medical: No    Non-medical: No  Tobacco Use  . Smoking status: Never Smoker  . Smokeless tobacco: Never Used  Substance and Sexual Activity  . Alcohol use: Yes  . Drug use: No  . Sexual activity: Yes    Partners: Male    Birth control/protection: I.U.D.  Lifestyle  . Physical activity:    Days per week: Not on file    Minutes per session: Not on file  . Stress: Not on file  Relationships  . Social connections:    Talks on phone: Not on  file    Gets together: Not on file    Attends religious service: Not on file    Active member of club or organization: Not on file    Attends meetings of clubs or organizations: Not on file    Relationship status: Not on file  . Intimate partner violence:    Fear of current or ex partner: Not on file    Emotionally abused: Not on file    Physically abused: Not on file    Forced sexual activity: Not on file  Other Topics Concern  . Not on file  Social History Narrative  . Not on file     Review of Systems: General: negative for chills, fever, night sweats or weight changes.  Cardiovascular: negative for chest pain, dyspnea on exertion, edema, orthopnea, palpitations, paroxysmal nocturnal dyspnea or shortness of breath Dermatological: negative for rash Respiratory: negative for cough or wheezing Urologic: negative for hematuria Abdominal: negative for nausea, vomiting, diarrhea, bright red blood per rectum, melena, or hematemesis Neurologic: negative for visual changes, syncope, or dizziness All other systems reviewed and are otherwise negative except as noted above.    Blood pressure (!) 110/92, pulse 65, height 5\' 1"  (1.549 m), weight 160 lb (72.6 kg).  General appearance: alert and no distress Neck: no adenopathy, no  carotid bruit, no JVD, supple, symmetrical, trachea midline and thyroid not enlarged, symmetric, no tenderness/mass/nodules Lungs: clear to auscultation bilaterally Heart: regular rate and rhythm, S1, S2 normal, no murmur, click, rub or gallop Extremities: extremities normal, atraumatic, no cyanosis or edema Pulses: 2+ and symmetric Skin: Skin color, texture, turgor normal. No rashes or lesions Neurologic: Alert and oriented X 3, normal strength and tone. Normal symmetric reflexes. Normal coordination and gait  EKG normal sinus rhythm at 65 without ST or T wave changes.  There was poor R wave progression.  I personally reviewed this EKG.  ASSESSMENT AND PLAN:    Essential hypertension [name] has a history of essential hypertension her blood pressure measured today at 110/92 on low-dose carvedilol.  Tachycardia History of palpitations/tachycardia with hyperthyroidism now 2 months status post radioiodine ablation.  Her symptoms have somewhat improved.  She does drink occasional caffeine which I have advised her to avoid.  We will readdress this in 3 months after she has been placed on thyroid replacement therapy and has equilibrated.      Runell GessJonathan J. Marguriete Wootan MD FACP,FACC,FAHA, [facility] 08/09/2018 12:12 PM

## 2018-08-09 NOTE — Assessment & Plan Note (Signed)
History of palpitations/tachycardia with hyperthyroidism now 2 months status post radioiodine ablation.  Her symptoms have somewhat improved.  She does drink occasional caffeine which I have advised her to avoid.  We will readdress this in 3 months after she has been placed on thyroid replacement therapy and has equilibrated.

## 2018-08-09 NOTE — Assessment & Plan Note (Signed)
Ms. Rozycki has a history of essential hypertension her blood pressure measured today at 110/92 on low-dose carvedilol.

## 2018-08-09 NOTE — Patient Instructions (Signed)
Medication Instructions:  NONE If you need a refill on your cardiac medications before your next appointment, please call your pharmacy.   Lab work: [employer] If you have labs (blood work) drawn today and your tests are completely normal, you will receive your results only by: Marland Kitchen MyChart Message (if you have MyChart) OR . A paper copy in the mail If you have any lab test that is abnormal or we need to change your treatment, we will call you to review the results.  Testing/Procedures: NONE  Follow-Up: At [facility], you and your health needs are our priority.  As part of our continuing mission to provide you with exceptional heart care, we have created designated Provider Care Teams.  These Care Teams include your primary Cardiologist (physician) and Advanced Practice Providers (APPs -  Physician Assistants and Nurse Practitioners) who all work together to provide you with the care you need, when you need it. . You will need a follow up appointment in 3 months. You may see Dr. Allyson Sabal or one of the following Advanced Practice Providers on your designated Care Team:   . Corine Shelter, [place] . Azalee Course, PA-C . Micah Flesher, PA-C . Joni Reining, DNP . Theodore Demark, PA-C . Judy Pimple, PA-C . Marjie Skiff, PA-C

## 2018-08-29 ENCOUNTER — Ambulatory Visit: Payer: Self-pay | Admitting: Endocrinology

## 2018-09-06 ENCOUNTER — Encounter: Payer: Self-pay | Admitting: Endocrinology

## 2018-09-06 ENCOUNTER — Ambulatory Visit (INDEPENDENT_AMBULATORY_CARE_PROVIDER_SITE_OTHER): Payer: Self-pay | Admitting: Endocrinology

## 2018-09-06 DIAGNOSIS — E039 Hypothyroidism, unspecified: Secondary | ICD-10-CM

## 2018-09-06 DIAGNOSIS — E890 Postprocedural hypothyroidism: Secondary | ICD-10-CM | POA: Insufficient documentation

## 2018-09-06 HISTORY — DX: Hypothyroidism, unspecified: E03.9

## 2018-09-06 LAB — T4, FREE: Free T4: 0.96 ng/dL (ref 0.60–1.60)

## 2018-09-06 LAB — TSH: TSH: 7.22 u[IU]/mL — AB (ref 0.35–4.50)

## 2018-09-06 MED ORDER — LEVOTHYROXINE SODIUM 125 MCG PO TABS: 125.0000 ug | ORAL_TABLET | Freq: Every day | ORAL | 2 refills | Status: DC

## 2018-09-06 NOTE — Patient Instructions (Addendum)
blood tests are requested for you today.  We'll let you know about the results.  Please stop taking the carvedilol.  Please come back for a follow-up appointment in 6 weeks.

## 2018-09-06 NOTE — Progress Notes (Signed)
Subjective:    Patient ID: Nicole Keller, female    DOB: [birth year], 28 y.o.   MRN: [id number]  HPI Pt returns for f/u of hyperthyroidism (due to Grave's Dz; dx'ed 2018; she took methimazole intermittently 2018-2019; she took RAI in 11/19; she started synthroid in 1/20; pt says she is not considering another pregnancy).  She takes synthroid as rx'ed.  pt states she feels well in general.   Past Medical History:  Diagnosis Date  . Hypertension   . Hyperthyroidism     No past surgical history on file.  Social History   Socioeconomic History  . Marital status: Single    Spouse name: Not on file  . Number of children: 2  . Years of education: Not on file  . Highest education level: Not on file  Occupational History  . Not on file  Social Needs  . Financial resource strain: Somewhat hard  . Food insecurity:    Worry: Never true    Inability: Never true  . Transportation needs:    Medical: No    Non-medical: No  Tobacco Use  . Smoking status: Never Smoker  . Smokeless tobacco: Never Used  Substance and Sexual Activity  . Alcohol use: Yes  . Drug use: No  . Sexual activity: Yes    Partners: Male    Birth control/protection: I.U.D.  Lifestyle  . Physical activity:    Days per week: Not on file    Minutes per session: Not on file  . Stress: Not on file  Relationships  . Social connections:    Talks on phone: Not on file    Gets together: Not on file    Attends religious service: Not on file    Active member of club or organization: Not on file    Attends meetings of clubs or organizations: Not on file    Relationship status: Not on file  . Intimate partner violence:    Fear of current or ex partner: Not on file    Emotionally abused: Not on file    Physically abused: Not on file    Forced sexual activity: Not on file  Other Topics Concern  . Not on file  Social History Narrative  . Not on file    No current outpatient medications on file prior to visit.    No current facility-administered medications on file prior to visit.     No Known Allergies  Family History  Problem Relation Age of Onset  . Hypertension Mother   . Thyroid disease Mother   . Hyperlipidemia Father   . Thyroid disease Sister     BP 104/78 (BP Location: Left Arm, Patient Position: Sitting, Cuff Size: Normal)   Pulse 70   Ht 5\' 1"  (1.549 m)   Wt 165 lb 12.8 oz (75.2 kg)   SpO2 96%   BMI 31.33 kg/m    Review of Systems Denies dizziness    Objective:   Physical Exam VITAL SIGNS:  See vs page GENERAL: no distress NECK: There is no palpable thyroid enlargement.  No thyroid nodule is palpable.  No palpable lymphadenopathy at the anterior neck.    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  We'll let you know about the results.  Please stop taking the carvedilol.  Please come back for a follow-up appointment in 6 weeks.

## 2018-10-26 ENCOUNTER — Other Ambulatory Visit: Payer: Self-pay

## 2018-10-26 ENCOUNTER — Other Ambulatory Visit: Payer: Self-pay | Admitting: Endocrinology

## 2018-10-26 ENCOUNTER — Ambulatory Visit (INDEPENDENT_AMBULATORY_CARE_PROVIDER_SITE_OTHER): Payer: Self-pay | Admitting: Endocrinology

## 2018-10-26 DIAGNOSIS — E890 Postprocedural hypothyroidism: Secondary | ICD-10-CM

## 2018-10-26 MED ORDER — LEVOTHYROXINE SODIUM 125 MCG PO TABS: 125.0000 ug | ORAL_TABLET | Freq: Every day | ORAL | 2 refills | Status: DC

## 2018-10-26 NOTE — Progress Notes (Addendum)
   Subjective:    Patient ID: Nicole Keller, female    DOB: 01/06/1991, 28 y.o.   MRN: [id number]  HPI  telehealth visit today via doxy video visit.  Alternatives to telehealth are presented to this patient, and the patient agrees to the telehealth visit. Pt is advised of the cost of the visit, and agrees to this, also.   Patient is at home, and I am at the office.   Pt returns for f/u of hyperthyroidism (due to Grave's Dz; dx'ed 2018; she took methimazole intermittently 2018-2019; she then took RAI in 11/19; she started synthroid in 1/20; pt says she is not considering another pregnancy).  She takes synthroid 125/d, as rx'ed.  pt states she feels well in general.   Past Medical History:  Diagnosis Date  . Hypertension   . Hyperthyroidism     No past surgical history on file.  Social History   Socioeconomic History  . Marital status: Single    Spouse name: Not on file  . Number of children: 2  . Years of education: Not on file  . Highest education level: Not on file  Occupational History  . Not on file  Social Needs  . Financial resource strain: Somewhat hard  . Food insecurity:    Worry: Never true    Inability: Never true  . Transportation needs:    Medical: No    Non-medical: No  Tobacco Use  . Smoking status: Never Smoker  . Smokeless tobacco: Never Used  Substance and Sexual Activity  . Alcohol use: Yes  . Drug use: No  . Sexual activity: Yes    Partners: Male    Birth control/protection: I.U.D.  Lifestyle  . Physical activity:    Days per week: Not on file    Minutes per session: Not on file  . Stress: Not on file  Relationships  . Social connections:    Talks on phone: Not on file    Gets together: Not on file    Attends religious service: Not on file    Active member of club or organization: Not on file    Attends meetings of clubs or organizations: Not on file    Relationship status: Not on file  . Intimate partner violence:    Fear of current or ex  partner: Not on file    Emotionally abused: Not on file    Physically abused: Not on file    Forced sexual activity: Not on file  Other Topics Concern  . Not on file  Social History Narrative  . Not on file    No current outpatient medications on file prior to visit.   No current facility-administered medications on file prior to visit.     No Known Allergies  Family History  Problem Relation Age of Onset  . Hypertension Mother   . Thyroid disease Mother   . Hyperlipidemia Father   . Thyroid disease Sister     Review of Systems No weight change    Objective:   Physical Exam     Assessment & Plan:  Post-RAI hypothyroidism: due for recheck.  Hyperthyroidism, by hx.  Recheck next time.    Patient Instructions  Blood tests are requested for you today.  We'll let you know about the results.   Please come back for a follow-up appointment in 6 weeks.

## 2018-10-26 NOTE — Patient Instructions (Signed)
Blood tests are requested for you today.  We'll let you know about the results.   Please come back for a follow-up appointment in 6 weeks.  

## 2018-11-07 ENCOUNTER — Other Ambulatory Visit (INDEPENDENT_AMBULATORY_CARE_PROVIDER_SITE_OTHER): Payer: Self-pay

## 2018-11-07 ENCOUNTER — Other Ambulatory Visit: Payer: Self-pay

## 2018-11-07 DIAGNOSIS — E890 Postprocedural hypothyroidism: Secondary | ICD-10-CM

## 2018-11-07 LAB — TSH: TSH: 1.05 u[IU]/mL (ref 0.35–4.50)

## 2018-11-07 LAB — T4, FREE: Free T4: 1.27 ng/dL (ref 0.60–1.60)

## 2018-11-08 ENCOUNTER — Ambulatory Visit: Payer: Self-pay | Admitting: Cardiovascular Disease

## 2018-11-23 ENCOUNTER — Institutional Professional Consult (permissible substitution): Payer: Self-pay | Admitting: Licensed Clinical Social Worker

## 2018-11-23 ENCOUNTER — Encounter (HOSPITAL_BASED_OUTPATIENT_CLINIC_OR_DEPARTMENT_OTHER): Payer: Self-pay | Admitting: Licensed Clinical Social Worker

## 2018-11-23 ENCOUNTER — Encounter: Payer: Self-pay | Admitting: Primary Care

## 2018-11-23 ENCOUNTER — Other Ambulatory Visit: Payer: Self-pay

## 2018-11-23 ENCOUNTER — Ambulatory Visit: Payer: Self-pay | Attending: Primary Care | Admitting: Primary Care

## 2018-11-23 VITALS — BP 117/80 | HR 66 | Temp 98.5°F | Resp 16 | Wt 168.4 lb

## 2018-11-23 DIAGNOSIS — F439 Reaction to severe stress, unspecified: Secondary | ICD-10-CM

## 2018-11-23 DIAGNOSIS — E02 Subclinical iodine-deficiency hypothyroidism: Secondary | ICD-10-CM

## 2018-11-23 DIAGNOSIS — H1131 Conjunctival hemorrhage, right eye: Secondary | ICD-10-CM

## 2018-11-23 DIAGNOSIS — F329 Major depressive disorder, single episode, unspecified: Secondary | ICD-10-CM

## 2018-11-23 DIAGNOSIS — F419 Anxiety disorder, unspecified: Secondary | ICD-10-CM

## 2018-11-23 NOTE — Progress Notes (Signed)
Pt states she woke up Saturday with her eye red

## 2018-11-23 NOTE — Progress Notes (Signed)
Acute Office Visit  Subjective:    Patient ID: Angelene GiovanniYesica Andrew, female    DOB: 03/21/1991, 28 y.o.   MRN: [id number]  Chief Complaint  Patient presents with  . Conjunctivitis    HPI Patient is in today for concern she woke up with a red eye several days ago some pain but gradually resolving. Explain and showed images of subconjunctival hemorrhage. She also express increased anxiety and depression from work and has children. She works at a call center and stressed.   Past Medical History:  Diagnosis Date  . Hypertension   . Hyperthyroidism     No past surgical history on file.  Family History  Problem Relation Age of Onset  . Hypertension Mother   . Thyroid disease Mother   . Hyperlipidemia Father   . Thyroid disease Sister     Social History   Socioeconomic History  . Marital status: Single    Spouse name: Not on file  . Number of children: 2  . Years of education: Not on file  . Highest education level: Not on file  Occupational History  . Not on file  Social Needs  . Financial resource strain: Somewhat hard  . Food insecurity:    Worry: Never true    Inability: Never true  . Transportation needs:    Medical: No    Non-medical: No  Tobacco Use  . Smoking status: Never Smoker  . Smokeless tobacco: Never Used  Substance and Sexual Activity  . Alcohol use: Yes  . Drug use: No  . Sexual activity: Yes    Partners: Male    Birth control/protection: I.U.D.  Lifestyle  . Physical activity:    Days per week: Not on file    Minutes per session: Not on file  . Stress: Not on file  Relationships  . Social connections:    Talks on phone: Not on file    Gets together: Not on file    Attends religious service: Not on file    Active member of club or organization: Not on file    Attends meetings of clubs or organizations: Not on file    Relationship status: Not on file  . Intimate partner violence:    Fear of current or ex partner: Not on file    Emotionally  abused: Not on file    Physically abused: Not on file    Forced sexual activity: Not on file  Other Topics Concern  . Not on file  Social History Narrative  . Not on file    Outpatient Medications Prior to Visit  Medication Sig Dispense Refill  . levothyroxine (SYNTHROID, LEVOTHROID) 125 MCG tablet Take 1 tablet (125 mcg total) by mouth daily before breakfast. 30 tablet 2   No facility-administered medications prior to visit.     No Known Allergies  Review of Systems  Constitutional: Negative.   HENT:       Red area in right eye  Eyes: Positive for redness.  Respiratory: Negative.   Cardiovascular: Negative.   Genitourinary: Negative.   Musculoskeletal: Negative.   Skin: Negative.   Neurological: Positive for headaches.  Endo/Heme/Allergies: Negative.   Psychiatric/Behavioral: Positive for depression. The patient is nervous/anxious.        Objective:    Physical Exam  Constitutional: She is oriented to person, place, and time. She appears well-developed and well-nourished.  HENT:  Head: Normocephalic and atraumatic.  Eyes: EOM are normal.  hemophage of eye  Cardiovascular: Normal rate and regular  rhythm.  Pulmonary/Chest: Effort normal and breath sounds normal.  Abdominal: Bowel sounds are normal.  Musculoskeletal: Normal range of motion.  Neurological: She is oriented to person, place, and time.  Skin: Skin is dry.  Psychiatric: She has a normal mood and affect.    BP 117/80   Pulse 66   Temp 98.5 F (36.9 C) (Oral)   Resp 16   Wt 168 lb 6.4 oz (76.4 kg)   SpO2 100%   BMI 31.82 kg/m  Wt Readings from Last 3 Encounters:  11/23/18 168 lb 6.4 oz (76.4 kg)  09/06/18 165 lb 12.8 oz (75.2 kg)  08/09/18 160 lb (72.6 kg)    There are no preventive care reminders to display for this patient.  There are no preventive care reminders to display for this patient.   Lab Results  Component Value Date   TSH 1.05 11/07/2018   Lab Results  Component Value  Date   WBC WILL FOLLOW 11/26/2017   HGB WILL FOLLOW 11/26/2017   HCT WILL FOLLOW 11/26/2017   MCV WILL FOLLOW 11/26/2017   PLT WILL FOLLOW 11/26/2017   Lab Results  Component Value Date   NA 144 03/24/2017   K 4.3 03/24/2017   CO2 23 03/24/2017   GLUCOSE 82 03/24/2017   BUN 10 03/24/2017   CREATININE 0.44 (L) 03/24/2017   BILITOT 0.5 03/24/2017   ALKPHOS 107 03/24/2017   AST 14 03/24/2017   ALT 22 03/24/2017   PROT 6.7 03/24/2017   ALBUMIN 4.2 03/24/2017   CALCIUM 9.3 03/24/2017   No results found for: CHOL No results found for: HDL No results found for: LDLCALC No results found for: TRIG No results found for: CHOLHDL No results found for: UGQB1Q     Assessment & Plan:   Problem List Items Addressed This Visit    None    Carliana was seen today for conjunctivitis.  Diagnoses and all orders for this visit:  Conjunctival hemorrhage of right eye Self resolving .  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    Grayce Sessions, NP

## 2018-11-25 ENCOUNTER — Other Ambulatory Visit: Payer: Self-pay | Admitting: Endocrinology

## 2018-12-19 ENCOUNTER — Other Ambulatory Visit: Payer: Self-pay

## 2018-12-21 ENCOUNTER — Ambulatory Visit (INDEPENDENT_AMBULATORY_CARE_PROVIDER_SITE_OTHER): Payer: Self-pay | Admitting: Endocrinology

## 2018-12-21 ENCOUNTER — Other Ambulatory Visit: Payer: Self-pay

## 2018-12-21 VITALS — BP 122/72 | HR 79 | Temp 98.5°F | Wt 169.2 lb

## 2018-12-21 DIAGNOSIS — E890 Postprocedural hypothyroidism: Secondary | ICD-10-CM

## 2018-12-21 NOTE — Patient Instructions (Addendum)
Blood tests are requested for you today.  We'll let you know about the results.  Based on the results, I'll send a prescription refill to [facility] Please come back for a follow-up appointment in 4-5 months.  Please come back sooner if a pregnancy happens.

## 2018-12-21 NOTE — Progress Notes (Signed)
   Subjective:    Patient ID: Nicole Keller, female    DOB: 10-21-1990, 28 y.o.   MRN: [id number]  HPI Pt returns for f/u of hyperthyroidism (due to [place]; dx'ed 2018; she took methimazole intermittently 2018-2019; she then took RAI in 11/19; she started synthroid in 1/20; pt says she is not considering another pregnancy).  She takes synthroid 125/d, as rx'ed.  pt states she feels well in general, except for weight gain.   Past Medical History:  Diagnosis Date  . Hypertension   . Hyperthyroidism     No past surgical history on file.  Social History   Socioeconomic History  . Marital status: Single    Spouse name: Not on file  . Number of children: 2  . Years of education: Not on file  . Highest education level: Not on file  Occupational History  . Not on file  Social Needs  . Financial resource strain: Somewhat hard  . Food insecurity    Worry: Never true    Inability: Never true  . Transportation needs    Medical: No    Non-medical: No  Tobacco Use  . Smoking status: Never Smoker  . Smokeless tobacco: Never Used  Substance and Sexual Activity  . Alcohol use: Yes  . Drug use: No  . Sexual activity: Yes    Partners: Male    Birth control/protection: I.U.D.  Lifestyle  . Physical activity    Days per week: Not on file    Minutes per session: Not on file  . Stress: Not on file  Relationships  . Social Herbalist on phone: Not on file    Gets together: Not on file    Attends religious service: Not on file    Active member of club or organization: Not on file    Attends meetings of clubs or organizations: Not on file    Relationship status: Not on file  . Intimate partner violence    Fear of current or ex partner: Not on file    Emotionally abused: Not on file    Physically abused: Not on file    Forced sexual activity: Not on file  Other Topics Concern  . Not on file  Social History Narrative  . Not on file    No current outpatient  medications on file prior to visit.   No current facility-administered medications on file prior to visit.     No Known Allergies  Family History  Problem Relation Age of Onset  . Hypertension Mother   . Thyroid disease Mother   . Hyperlipidemia Father   . Thyroid disease Sister     BP 122/72 (BP Location: Left Arm, Patient Position: Sitting, Cuff Size: Normal)   Pulse 79   Temp 98.5 F (36.9 C) (Oral)   Wt 169 lb 3.2 oz (76.7 kg)   SpO2 98%   BMI 31.97 kg/m    Review of Systems Denies neck swelling    Objective:   Physical Exam VITAL SIGNS:  See vs page GENERAL: no distress NECK: There is no palpable thyroid enlargement.  No thyroid nodule is palpable.  No palpable lymphadenopathy at the anterior neck.    Lab Results  Component Value Date   TSH 0.41 12/21/2018   T4TOTAL 8.4 02/23/2018       Assessment & Plan:  Hypothyroidism: well-replaced Anxiety: in this setting, she needs to maintain euthyroidism.

## 2018-12-22 LAB — T4, FREE: Free T4: 1.18 ng/dL (ref 0.60–1.60)

## 2018-12-22 LAB — TSH: TSH: 0.41 u[IU]/mL (ref 0.35–4.50)

## 2018-12-22 MED ORDER — LEVOTHYROXINE SODIUM 125 MCG PO TABS: 125.0000 ug | ORAL_TABLET | Freq: Every day | ORAL | 3 refills | Status: DC

## 2019-01-11 NOTE — Progress Notes (Signed)
Integrated Behavioral Health Initial Visit  MRN: [id number] Name: Nicole Keller  Number of [place] Clinician visits:: 1/6 Session Start time: 9:40 AM  Session End time: 10:00 AM Total time: 20 minutes  Type of Service: [place] Interpretor:No. Interpretor Name and Language: N/A   Warm Hand Off Completed.       SUBJECTIVE: Nicole Keller is a 28 y.o. female accompanied by self Patient was referred by NP Edwards for anxiety and depression. Patient reports the following symptoms/concerns: Pt reports difficulty managing anxiety and depression symptoms triggered by COVID-19 and work stress. Symptoms include racing thoughts, irritability, and twitching of the eye Duration of problem: weeks; Severity of problem: moderate  OBJECTIVE: Mood: Anxious and Affect: Appropriate Risk of harm to self or others: No plan to harm self or others  LIFE CONTEXT: Family and Social: Pt resides with minor children (18 and 37 yo) and boyfriend School/Work: Pt is employed full time Self-Care: Pt had difficulty identifying any self care strategies Life Changes: Pt reports difficulty managing anxiety and depression symptoms triggered by COVID-19 and work stress.  STRENGTHS Pt has good insight   GOALS ADDRESSED: Patient will: 1. Reduce symptoms of: anxiety, depression and stress 2. Increase knowledge and/or ability of: coping skills and healthy habits  3. Demonstrate ability to: Increase healthy adjustment to current life circumstances and Increase adequate support systems for patient/family  INTERVENTIONS: Interventions utilized: Solution-Focused Strategies, Supportive Counseling and Psychoeducation and/or Health Education  Standardized Assessments completed: GAD-7 and PHQ 2&9  ASSESSMENT: Patient currently experiencing difficulty managing anxiety and depression symptoms triggered by COVID-19 and work stress. 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 Symptoms include racing thoughts, irritability, and twitching of the eye. Pt denies SI/HI.   Patient may benefit from psychoeducation. LCSW educated pt on the correlation between one's physical and mental health, in addition, to how stress can negatively impact both. Therapeutic strategies were discussed to assist with decrease in symptoms and improvement of self-care.   PLAN: 1. Follow up with behavioral health clinician on : Pt encouraged to schedule appointment with LCSW 2. Behavioral recommendations: LCSW recommends pt apply healthy coping skills discussed 3. Referral(s): [facility] (In Clinic) 4. "From scale of 1-10, how likely are you to follow plan?":   Rebekah Chesterfield, LCSW 01/11/2019 10:58 AM

## 2019-02-18 ENCOUNTER — Other Ambulatory Visit: Payer: Self-pay | Admitting: Endocrinology

## 2019-03-10 ENCOUNTER — Ambulatory Visit: Payer: Self-pay | Admitting: Cardiovascular Disease

## 2019-04-09 ENCOUNTER — Other Ambulatory Visit: Payer: Self-pay

## 2019-04-09 ENCOUNTER — Encounter (HOSPITAL_COMMUNITY): Payer: Self-pay | Admitting: Emergency Medicine

## 2019-04-09 ENCOUNTER — Emergency Department (HOSPITAL_COMMUNITY)
Admission: EM | Admit: 2019-04-09 | Discharge: 2019-04-09 | Disposition: A | Discharge status: Home / Self Care | Payer: Self-pay | Attending: Emergency Medicine | Admitting: Emergency Medicine

## 2019-04-09 ENCOUNTER — Emergency Department (HOSPITAL_COMMUNITY): Payer: Self-pay

## 2019-04-09 DIAGNOSIS — I10 Essential (primary) hypertension: Secondary | ICD-10-CM | POA: Insufficient documentation

## 2019-04-09 DIAGNOSIS — E039 Hypothyroidism, unspecified: Secondary | ICD-10-CM | POA: Insufficient documentation

## 2019-04-09 DIAGNOSIS — Z79899 Other long term (current) drug therapy: Secondary | ICD-10-CM | POA: Insufficient documentation

## 2019-04-09 DIAGNOSIS — R0789 Other chest pain: Secondary | ICD-10-CM | POA: Insufficient documentation

## 2019-04-09 LAB — CBC
HCT: 42.5 % (ref 36.0–46.0)
Hemoglobin: 14.6 g/dL (ref 12.0–15.0)
MCH: 30.2 pg (ref 26.0–34.0)
MCHC: 34.4 g/dL (ref 30.0–36.0)
MCV: 87.8 fL (ref 80.0–100.0)
Platelets: 315 10*3/uL (ref 150–400)
RBC: 4.84 MIL/uL (ref 3.87–5.11)
RDW: 13.1 % (ref 11.5–15.5)
WBC: 14.5 10*3/uL — ABNORMAL HIGH (ref 4.0–10.5)
nRBC: 0 % (ref 0.0–0.2)

## 2019-04-09 LAB — TROPONIN I (HIGH SENSITIVITY)
Troponin I (High Sensitivity): 30 ng/L — ABNORMAL HIGH (ref <18)
Troponin I (High Sensitivity): 23 ng/L — ABNORMAL HIGH (ref <18)

## 2019-04-09 LAB — BASIC METABOLIC PANEL
Anion gap: 10 (ref 5–15)
BUN: 7 mg/dL (ref 6–20)
CO2: 27 mmol/L (ref 22–32)
Calcium: 9 mg/dL (ref 8.9–10.3)
Chloride: 100 mmol/L (ref 98–111)
Creatinine, Ser: 0.75 mg/dL (ref 0.44–1.00)
GFR calc Af Amer: >60 mL/min (ref >60)
GFR calc non Af Amer: >60 mL/min (ref >60)
Glucose, Bld: 102 mg/dL — ABNORMAL HIGH (ref 70–99)
Potassium: 3.9 mmol/L (ref 3.5–5.1)
Sodium: 137 mmol/L (ref 135–145)

## 2019-04-09 LAB — URINALYSIS, ROUTINE W REFLEX MICROSCOPIC
Bilirubin Urine: NEGATIVE
Glucose, UA: NEGATIVE mg/dL
Hgb urine dipstick: NEGATIVE
Ketones, ur: 20 mg/dL — AB
Leukocytes,Ua: NEGATIVE
Nitrite: NEGATIVE
Protein, ur: NEGATIVE mg/dL
Specific Gravity, Urine: 1.012 (ref 1.005–1.030)
pH: 6 (ref 5.0–8.0)

## 2019-04-09 LAB — TSH: TSH: 1.296 u[IU]/mL (ref 0.350–4.500)

## 2019-04-09 LAB — POC URINE PREG, ED: Preg Test, Ur: NEGATIVE

## 2019-04-09 MED ORDER — KETOROLAC TROMETHAMINE 15 MG/ML IJ SOLN
15.0000 mg | Freq: Once | INTRAMUSCULAR | Status: AC
  Administered 2019-04-09: 15 mg via INTRAVENOUS
  Filled 2019-04-09: qty 1

## 2019-04-09 NOTE — Discharge Instructions (Signed)
You were seen today for chest pain. Your blood counts, liver/kidney levels and electrolytes that we checked were normal. Your thyroid function was normal. Your EKG and chest xray were normal. You have a cardiac enzyme test that was slightly elevated. This was discussed with the cardiologist  today and we are in agreement that we do not believe your chest pain is coming from an acute heart attack or heart problem. You should follow up closely with your primary care provider. If you have any new or worsening symptoms please return to the ER.

## 2019-04-09 NOTE — Progress Notes (Signed)
Progress Note   [name] YIR:[id number] DOB: 1991/01/09 DOA: 04/09/2019  PCP: Kerin Perna, NP  Chief Complaint: Chest pain  HPI: Nicole Keller is a 28 y.o. female with medical history significant of HTN and hypothyroidism (s/p ablative thyroidectomy) presenting with chest pain.   ED Course:  Chest pain, atypical and constant since midnight.  Toradol improved, now 2/10.  EKG and CXR negative.  HS troponin 30, 23.      Past Medical History:  Diagnosis Date  . Hypertension   . Hyperthyroidism     History reviewed. No pertinent surgical history.  Social History   Socioeconomic History  . Marital status: Single    Spouse name: Not on file  . Number of children: 2  . Years of education: Not on file  . Highest education level: Not on file  Occupational History  . Not on file  Social Needs  . Financial resource strain: Somewhat hard  . Food insecurity    Worry: Never true    Inability: Never true  . Transportation needs    Medical: No    Non-medical: No  Tobacco Use  . Smoking status: Never Smoker  . Smokeless tobacco: Never Used  Substance and Sexual Activity  . Alcohol use: Yes  . Drug use: No  . Sexual activity: Yes    Partners: Male    Birth control/protection: I.U.D.  Lifestyle  . Physical activity    Days per week: Not on file    Minutes per session: Not on file  . Stress: Not on file  Relationships  . Social Herbalist on phone: Not on file    Gets together: Not on file    Attends religious service: Not on file    Active member of club or organization: Not on file    Attends meetings of clubs or organizations: Not on file    Relationship status: Not on file  . Intimate partner violence    Fear of current or ex partner: Not on file    Emotionally abused: Not on file    Physically abused: Not on file    Forced sexual activity: Not on file  Other Topics Concern  . Not on file  Social History Narrative  . Not on file    No  Known Allergies  Family History  Problem Relation Age of Onset  . Hypertension Mother   . Thyroid disease Mother   . Hyperlipidemia Father   . Thyroid disease Sister     Prior to Admission medications   Medication Sig Start Date End Date Taking? Authorizing Provider  levothyroxine (SYNTHROID) 125 MCG tablet TAKE 1 TABLET BY MOUTH EVERY DAY BEFORE BREAKFAST 02/18/19   Renato Shin, MD    Physical Exam: Vitals:   04/09/19 1137 04/09/19 1404 04/09/19 1415 04/09/19 1438  BP:  (!) 136/121 115/65 118/70  Pulse:  80 74 73  Resp:  14 16 12   Temp:      TempSrc:      SpO2: 99% 96% 98% 97%     Radiological Exams on Admission: Dg Chest Port 1 View  Result Date: 04/09/2019 CLINICAL DATA:  Chest pain for 2 days. EXAM: PORTABLE CHEST 1 VIEW COMPARISON:  Chest x-ray 05/11/2015 FINDINGS: The cardiac silhouette, mediastinal and hilar contours are within normal limits and stable. The lungs are clear. No pleural effusion. No pneumothorax. The bony thorax is intact. IMPRESSION: No acute cardiopulmonary findings. Electronically Signed   By: Ricky Stabs.D.  On: 04/09/2019 12:06    EKG: Independently reviewed.  NSR with rate 86; nonspecific ST changes with no evidence of acute ischemia   Labs on Admission: I have personally reviewed the available labs and imaging studies at the time of the admission.  Pertinent labs:   Glucose 102 HS troponin 30, 23 WBC 14.5 Upreg negative TSH 1.296 UA: 20 ketones   Chest pain - very unlikely to be cardiac in the setting of young age and few CKD RF.  I have requested that the patient be discussed with cardiology; if cardiology evaluates the patient and requests admission, [facility] will be happy to consult at that time.   Jonah Blue MD [facility]   How to contact the [facility] Attending or Consulting provider 7A - 7P or covering provider during after hours 7P -7A, for this patient?  1. Check the care team in [facility] and look for a)  attending/consulting [facility] provider listed and b) the [facility] team listed 2. Log into [email or web address] and use [place]'s universal password to access. If you do not have the password, please contact the hospital operator. 3. Locate the [facility] provider you are looking for under [facility] and page to a number that you can be directly reached. 4. If you still have difficulty reaching the provider, please page the [facility] (Director on Call) for the Hospitalists listed on amion for assistance.   04/09/2019, 3:06 PM

## 2019-04-09 NOTE — ED Provider Notes (Signed)
[facility] EMERGENCY DEPARTMENT Provider Note   CSN: [id number] Arrival date & time: 04/09/19  1042     History   Chief Complaint No chief complaint on file.   HPI Nicole Keller is a 28 y.o. female.     A 28 year old patient with a history of hypertension, hypothyroidism and obesity presents for evaluation of chest pain. Initial onset of pain was more than 6 hours ago. The patient's chest pain is sharp and is not worse with exertion. The patient's chest pain is middle- or left-sided, is not well-localized, is not described as heaviness/pressure/tightness and does not radiate to the arms/jaw/neck. The patient does not complain of nausea and denies diaphoresis. The patient has no history of stroke, has no history of peripheral artery disease, has not smoked in the past 90 days, denies any history of treated diabetes, has no relevant family history of coronary artery disease (first degree relative at less than age 19) and has no history of hypercholesterolemia. Denies drug or alcohol use. Denies excessive caffeine or anxiety.      Past Medical History:  Diagnosis Date  . Hypertension   . Hyperthyroidism     Patient Active Problem List   Diagnosis Date Noted  . Hypothyroidism 09/06/2018  . Essential hypertension 08/09/2018  . Class 1 obesity with serious comorbidity and body mass index (BMI) of 30.0 to 30.9 in adult 01/12/2018  . Pap smear abnormality of cervix with LGSIL 12/07/2017  . Depression with anxiety 10/12/2017  . Tachycardia 10/12/2017    History reviewed. No pertinent surgical history.   OB History   No obstetric history on file.      Home Medications    Prior to Admission medications   Medication Sig Start Date End Date Taking? Authorizing Provider  levothyroxine (SYNTHROID) 125 MCG tablet TAKE 1 TABLET BY MOUTH EVERY DAY BEFORE BREAKFAST 02/18/19   Romero Belling, MD    Family History Family History  Problem Relation Age of Onset  .  Hypertension Mother   . Thyroid disease Mother   . Hyperlipidemia Father   . Thyroid disease Sister     Social History Social History   Tobacco Use  . Smoking status: Never Smoker  . Smokeless tobacco: Never Used  Substance Use Topics  . Alcohol use: Yes  . Drug use: No     Allergies   Patient has no known allergies.   Review of Systems Review of Systems  Constitutional: Negative for chills and fever.  HENT: Negative for ear pain and sore throat.   Eyes: Negative for pain and visual disturbance.  Respiratory: Positive for chest tightness. Negative for cough and shortness of breath.   Cardiovascular: Positive for chest pain. Negative for palpitations.  Gastrointestinal: Negative for abdominal pain, diarrhea, nausea and vomiting.  Genitourinary: Negative for dysuria and hematuria.  Musculoskeletal: Negative for arthralgias, back pain and myalgias.  Skin: Negative for color change and rash.  Neurological: Negative for syncope and light-headedness.  Psychiatric/Behavioral: The patient is not nervous/anxious.   All other systems reviewed and are negative.    Physical Exam Updated Vital Signs BP 111/71   Pulse 68   Temp 98.4 F (36.9 C) (Oral)   Resp 20   SpO2 97%   Physical Exam Vitals signs and nursing note reviewed.  Constitutional:      General: She is not in acute distress.    Appearance: She is well-developed.  HENT:     Head: Normocephalic and atraumatic.     Mouth/Throat:  Mouth: Mucous membranes are moist.     Pharynx: Oropharynx is clear.  Eyes:     Conjunctiva/sclera: Conjunctivae normal.  Neck:     Musculoskeletal: Neck supple.  Cardiovascular:     Rate and Rhythm: Normal rate and regular rhythm.     Heart sounds: No murmur.  Pulmonary:     Effort: Pulmonary effort is normal. No respiratory distress.     Breath sounds: Normal breath sounds.  Abdominal:     Palpations: Abdomen is soft.     Tenderness: There is no abdominal tenderness.   Skin:    General: Skin is warm and dry.     Capillary Refill: Capillary refill takes less than 2 seconds.  Neurological:     Mental Status: She is alert.  Psychiatric:        Mood and Affect: Mood normal.      ED Treatments / Results  Labs (all labs ordered are listed, but only abnormal results are displayed) Labs Reviewed  BASIC METABOLIC PANEL - Abnormal; Notable for the following components:      Result Value   Glucose, Bld 102 (*)    All other components within normal limits  CBC - Abnormal; Notable for the following components:   WBC 14.5 (*)    All other components within normal limits  URINALYSIS, ROUTINE W REFLEX MICROSCOPIC - Abnormal; Notable for the following components:   APPearance HAZY (*)    Ketones, ur 20 (*)    All other components within normal limits  TROPONIN I (HIGH SENSITIVITY) - Abnormal; Notable for the following components:   Troponin I (High Sensitivity) 30 (*)    All other components within normal limits  TROPONIN I (HIGH SENSITIVITY) - Abnormal; Notable for the following components:   Troponin I (High Sensitivity) 23 (*)    All other components within normal limits  TSH  RAPID URINE DRUG SCREEN, HOSP PERFORMED  POC URINE PREG, ED    EKG EKG Interpretation  Date/Time:  Sunday April 09 2019 11:05:07 EDT Ventricular Rate:  86 PR Interval:    QRS Duration: 74 QT Interval:  352 QTC Calculation: 421 R Axis:   62 Text Interpretation:  Sinus rhythm Borderline T abnormalities, inferior leads t wave changes new since last tracing Confirmed by Linwood DibblesKnapp, Jon [phone]([id number]) on 04/09/2019 11:07:16 AM   Radiology Dg Chest Port 1 View  Result Date: 04/09/2019 CLINICAL DATA:  Chest pain for 2 days. EXAM: PORTABLE CHEST 1 VIEW COMPARISON:  Chest x-ray 05/11/2015 FINDINGS: The cardiac silhouette, mediastinal and hilar contours are within normal limits and stable. The lungs are clear. No pleural effusion. No pneumothorax. The bony thorax is intact. IMPRESSION:  No acute cardiopulmonary findings. Electronically Signed   By: Rudie MeyerP.  Gallerani M.D.   On: 04/09/2019 12:06    Procedures Procedures (including critical care time)  Medications Ordered in ED Medications  ketorolac (TORADOL) 15 MG/ML injection 15 mg (15 mg Intravenous Given 04/09/19 1135)     Initial Impression / Assessment and Plan / ED Course  I have reviewed the triage vital signs and the nursing notes.  Pertinent labs & imaging results that were available during my care of the patient were reviewed by me and considered in my medical decision making (see chart for details).  Clinical Course as of Apr 09 1531  Sun Apr 09, 2019  1311 27 y/o patient with atypical chest pain since last night. Heart score 1. Improved with toradol. NAD. Unremarkable labs and ekg. Initial trop of 30. Waiting  on second trop. PERC negative   [KM]  1509 Second troponin was elevated to 23 which is actually less than the initial 30.  However, according to heart pathway score patient should be admitted for observations.  I spoke with hospitalist Dr. Lorin Mercy who does not think this sounds like ACS and would like me to run this patient by cardiology for possible discharge home.  Waiting on cardiology consult at this time.   [KM]  [place] with Dr. Caryl Comes cardiology who agrees patient presentation and workup not consistent with ACS and patient should be ok for d/c. Patient in agreement with plan and to f/u with outpatient PMD.    [KM]    Clinical Course User Index [KM] Alveria Apley, PA-C       Based on review of vitals, medical screening exam, lab work and/or imaging, there does not appear to be an acute, emergent etiology for the patient's symptoms. Counseled pt on good return precautions and encouraged both PCP and ED follow-up as needed.  Prior to discharge, I also discussed incidental imaging findings with patient in detail and advised appropriate, recommended follow-up in detail.  Clinical Impression: 1.  Atypical chest pain     Disposition: Discharge  Prior to providing a prescription for a controlled substance, I independently reviewed the patient's recent prescription history on the [place]. The patient had no recent or regular prescriptions and was deemed appropriate for a brief, less than 3 day prescription of narcotic for acute analgesia.  This note was prepared with assistance of Systems analyst. Occasional wrong-word or sound-a-like substitutions may have occurred due to the inherent limitations of voice recognition software.   Final Clinical Impressions(s) / ED Diagnoses   Final diagnoses:  Atypical chest pain    ED Discharge Orders    None       Kristine Royal 04/09/19 1532    Dorie Rank, MD 04/09/19 2100

## 2019-04-09 NOTE — ED Triage Notes (Signed)
Co chest pain that mid that radiates up her neck. Pt rates it 6. Pt gets sweating and restless.

## 2019-04-20 ENCOUNTER — Encounter (INDEPENDENT_AMBULATORY_CARE_PROVIDER_SITE_OTHER): Payer: Self-pay | Admitting: Primary Care

## 2019-04-20 ENCOUNTER — Other Ambulatory Visit: Payer: Self-pay

## 2019-04-20 ENCOUNTER — Ambulatory Visit (INDEPENDENT_AMBULATORY_CARE_PROVIDER_SITE_OTHER): Payer: Self-pay | Admitting: Primary Care

## 2019-04-20 DIAGNOSIS — Z09 Encounter for follow-up examination after completed treatment for conditions other than malignant neoplasm: Secondary | ICD-10-CM

## 2019-04-20 DIAGNOSIS — R0789 Other chest pain: Secondary | ICD-10-CM

## 2019-04-20 NOTE — Progress Notes (Signed)
Pt states she did not have a menstrual in cycle sept

## 2019-04-20 NOTE — Progress Notes (Signed)
Virtual Visit via Telephone Note  I connected with Nicole Keller on 04/20/19 at  3:50 PM EDT by telephone and verified that I am speaking with the correct person using two identifiers.   I discussed the limitations, risks, security and privacy concerns of performing an evaluation and management service by telephone and the availability of in person appointments. I also discussed with the patient that there may be a patient responsible charge related to this service. The patient expressed understanding and agreed to proceed.   History of Present Illness: [name] is a tele for hospital follow up for emergency room visit for a typical chest pain. She has an appointment schedules with Dr. Quay Burow on 04/27/2019. Work-up did not present as cardiac in origin.   Past Medical History:  Diagnosis Date  . Hypertension   . Hyperthyroidism    Observations/Objective: Review of Systems  All other systems reviewed and are negative.   Assessment and Plan: Nicole Keller was seen today for hospitalization follow-up.  Diagnoses and all orders for this visit:  Hospital discharge follow-up Discharge instructions was given to schedule an appointment with Kerin Perna, NP (Internal Medicine) to establish a doctor-patient relationship.  Atypical chest pain Follow up with cardiology Dr. Gwenlyn Found schedule on 10/15. Evaluating atypical chest pain. Previous history of palpation/tachycardia.    Follow Up Instructions:    I discussed the assessment and treatment plan with the patient. The patient was provided an opportunity to ask questions and all were answered. The patient agreed with the plan and demonstrated an understanding of the instructions.   The patient was advised to call back or seek an in-person evaluation if the symptoms worsen or if the condition fails to improve as anticipated.  I provided 18 minutes of non-face-to-face time during this encounter. Includes reviewing previous  encounters, labs and imaging   Kerin Perna, NP

## 2019-04-27 ENCOUNTER — Ambulatory Visit: Payer: Self-pay | Admitting: Cardiovascular Disease

## 2019-04-28 ENCOUNTER — Other Ambulatory Visit: Payer: Self-pay

## 2019-05-02 ENCOUNTER — Ambulatory Visit (INDEPENDENT_AMBULATORY_CARE_PROVIDER_SITE_OTHER): Payer: Self-pay | Admitting: Endocrinology

## 2019-05-02 ENCOUNTER — Telehealth: Payer: Self-pay | Admitting: Endocrinology

## 2019-05-02 ENCOUNTER — Other Ambulatory Visit: Payer: Self-pay

## 2019-05-02 ENCOUNTER — Encounter: Payer: Self-pay | Admitting: Endocrinology

## 2019-05-02 VITALS — BP 110/70 | HR 81 | Ht 61.0 in | Wt 165.4 lb

## 2019-05-02 DIAGNOSIS — E890 Postprocedural hypothyroidism: Secondary | ICD-10-CM

## 2019-05-02 NOTE — Telephone Encounter (Signed)
Patient stated at check out that she wants her Preferred Pharmacy to be: [id number] - [place], [place] - [address] [phone] (Phone) [phone] (Fax)

## 2019-05-02 NOTE — Progress Notes (Signed)
Subjective:    Patient ID: Nicole Keller, female    DOB: [birth year], 28 y.o.   MRN: [id number]  HPI Pt returns for f/u of hyperthyroidism (due to Grave's Dz; dx'ed 2018; she took methimazole intermittently 2018-2019; she then took RAI in 11/19; she started synthroid in 1/20; pt says she is not considering another pregnancy).  She takes synthroid 125/d, as rx'ed.  pt states she feels well in general.   Past Medical History:  Diagnosis Date  . Hypertension   . Hyperthyroidism     No past surgical history on file.  Social History   Socioeconomic History  . Marital status: Single    Spouse name: Not on file  . Number of children: 2  . Years of education: Not on file  . Highest education level: Not on file  Occupational History  . Not on file  Social Needs  . Financial resource strain: Somewhat hard  . Food insecurity    Worry: Never true    Inability: Never true  . Transportation needs    Medical: No    Non-medical: No  Tobacco Use  . Smoking status: Never Smoker  . Smokeless tobacco: Never Used  Substance and Sexual Activity  . Alcohol use: Yes  . Drug use: No  . Sexual activity: Yes    Partners: Male    Birth control/protection: I.U.D.  Lifestyle  . Physical activity    Days per week: Not on file    Minutes per session: Not on file  . Stress: Not on file  Relationships  . Social Musician on phone: Not on file    Gets together: Not on file    Attends religious service: Not on file    Active member of club or organization: Not on file    Attends meetings of clubs or organizations: Not on file    Relationship status: Not on file  . Intimate partner violence    Fear of current or ex partner: Not on file    Emotionally abused: Not on file    Physically abused: Not on file    Forced sexual activity: Not on file  Other Topics Concern  . Not on file  Social History Narrative  . Not on file    Current Outpatient Medications on File Prior to Visit   Medication Sig Dispense Refill  . levothyroxine (SYNTHROID) 125 MCG tablet TAKE 1 TABLET BY MOUTH EVERY DAY BEFORE BREAKFAST 90 tablet 0   No current facility-administered medications on file prior to visit.     No Known Allergies  Family History  Problem Relation Age of Onset  . Hypertension Mother   . Thyroid disease Mother   . Hyperlipidemia Father   . Thyroid disease Sister     BP 110/70 (BP Location: Right Arm, Patient Position: Sitting, Cuff Size: Normal)   Pulse 81   Ht 5\' 1"  (1.549 m)   Wt 165 lb 6.4 oz (75 kg)   LMP 04/14/2019 (Exact Date)   SpO2 96%   BMI 31.25 kg/m    Review of Systems She has lost a few lbs    Objective:   Physical Exam VITAL SIGNS:  See vs page GENERAL: no distress Pulses: dorsalis pedis intact bilat.   MSK: no deformity of the feet CV: no leg edema Skin:  no ulcer on the feet.  normal color and temp on the feet. Neuro: sensation is intact to touch on the feet   Lab Results  Component  Value Date   TSH 1.296 04/09/2019   T4TOTAL 8.4 02/23/2018      Assessment & Plan:  Hypothyroidism: well-replaced.  Please continue the same medication.  Depression/anxiety: in this setting, pt needs to maintain euthyroidism.   Patient Instructions  Blood tests are requested for you today.  We'll let you know about the results.   Please come back for a follow-up appointment in 6 months.   Please come back sooner if a pregnancy happens.

## 2019-05-02 NOTE — Telephone Encounter (Signed)
No further action required to this encounter since this is already listed as pt preferred pharamcy

## 2019-05-02 NOTE — Patient Instructions (Signed)
Blood tests are requested for you today.  We'll let you know about the results.   Please come back for a follow-up appointment in 6 months.   Please come back sooner if a pregnancy happens.

## 2019-05-21 ENCOUNTER — Other Ambulatory Visit: Payer: Self-pay | Admitting: Endocrinology

## 2019-08-27 ENCOUNTER — Other Ambulatory Visit: Payer: Self-pay | Admitting: Endocrinology

## 2019-10-31 ENCOUNTER — Other Ambulatory Visit: Payer: Self-pay

## 2019-11-02 ENCOUNTER — Ambulatory Visit: Payer: Self-pay | Admitting: Endocrinology

## 2019-11-02 ENCOUNTER — Encounter: Payer: Self-pay | Admitting: Endocrinology

## 2019-11-02 ENCOUNTER — Other Ambulatory Visit: Payer: Self-pay

## 2019-11-02 VITALS — BP 110/80 | HR 74 | Ht 61.0 in | Wt 160.0 lb

## 2019-11-02 DIAGNOSIS — E890 Postprocedural hypothyroidism: Secondary | ICD-10-CM

## 2019-11-02 NOTE — Patient Instructions (Signed)
Thyroid blood tests are requested for you today.  We'll let you know about the results.   Please come back for a follow-up appointment in 6-12 months.   Please come back sooner if a pregnancy happens.

## 2019-11-02 NOTE — Progress Notes (Signed)
   Subjective:    Patient ID: Nicole Keller, female    DOB: 05/02/1991, 29 y.o.   MRN: [id number]  HPI Pt returns for f/u of hyperthyroidism (due to Grave's Dz; dx'ed 2018; she took methimazole intermittently 2018-2019; she then took RAI in 11/19; she started synthroid in 1/20; pt says she is not considering another pregnancy).  She takes synthroid 125/d, as rx'ed.  pt states she feels well in general.   Past Medical History:  Diagnosis Date  . Hypertension   . Hyperthyroidism     No past surgical history on file.  Social History   Socioeconomic History  . Marital status: Single    Spouse name: Not on file  . Number of children: 2  . Years of education: Not on file  . Highest education level: Not on file  Occupational History  . Not on file  Tobacco Use  . Smoking status: Never Smoker  . Smokeless tobacco: Never Used  Substance and Sexual Activity  . Alcohol use: Yes  . Drug use: No  . Sexual activity: Yes    Partners: Male    Birth control/protection: I.U.D.  Other Topics Concern  . Not on file  Social History Narrative  . Not on file   Social Determinants of Health   Financial Resource Strain:   . Difficulty of Paying Living Expenses:   Food Insecurity:   . Worried About Programme researcher, broadcasting/film/video in the Last Year:   . Barista in the Last Year:   Transportation Needs:   . Freight forwarder (Medical):   Marland Kitchen Lack of Transportation (Non-Medical):   Physical Activity:   . Days of Exercise per Week:   . Minutes of Exercise per Session:   Stress:   . Feeling of Stress :   Social Connections:   . Frequency of Communication with Friends and Family:   . Frequency of Social Gatherings with Friends and Family:   . Attends Religious Services:   . Active Member of Clubs or Organizations:   . Attends Banker Meetings:   Marland Kitchen Marital Status:   Intimate Partner Violence:   . Fear of Current or Ex-Partner:   . Emotionally Abused:   Marland Kitchen Physically Abused:    . Sexually Abused:     Current Outpatient Medications on File Prior to Visit  Medication Sig Dispense Refill  . levothyroxine (SYNTHROID) 125 MCG tablet TAKE 1 TABLET BY MOUTH EVERY DAY BEFORE BREAKFAST 90 tablet 0   No current facility-administered medications on file prior to visit.    No Known Allergies  Family History  Problem Relation Age of Onset  . Hypertension Mother   . Thyroid disease Mother   . Hyperlipidemia Father   . Thyroid disease Sister     BP 110/80   Pulse 74   Ht 5\' 1"  (1.549 m)   Wt 160 lb (72.6 kg)   SpO2 98%   BMI 30.23 kg/m    Review of Systems     Objective:   Physical Exam VITAL SIGNS:  See vs page GENERAL: no distress NECK: There is no palpable thyroid enlargement.  No thyroid nodule is palpable.  No palpable lymphadenopathy at the anterior neck.   Lab Results  Component Value Date   TSH 0.59 11/02/2019   T4TOTAL 8.4 02/23/2018      Assessment & Plan:  Hypothyroidism: well-replaced.  Please continue the same medications Please come back for a follow-up appointment in 6-12 months

## 2019-11-03 LAB — TSH: TSH: 0.59 u[IU]/mL (ref 0.35–4.50)

## 2019-11-24 ENCOUNTER — Other Ambulatory Visit: Payer: Self-pay | Admitting: Internal Medicine

## 2020-02-18 ENCOUNTER — Other Ambulatory Visit: Payer: Self-pay | Admitting: Endocrinology

## 2020-05-07 ENCOUNTER — Encounter (INDEPENDENT_AMBULATORY_CARE_PROVIDER_SITE_OTHER): Payer: Self-pay | Admitting: Primary Care

## 2020-05-07 ENCOUNTER — Other Ambulatory Visit (HOSPITAL_COMMUNITY)
Admission: RE | Admit: 2020-05-07 | Discharge: 2020-05-07 | Disposition: A | Discharge status: Home / Self Care | Payer: Self-pay | Source: Ambulatory Visit | Attending: Primary Care | Admitting: Primary Care

## 2020-05-07 ENCOUNTER — Other Ambulatory Visit: Payer: Self-pay

## 2020-05-07 ENCOUNTER — Ambulatory Visit (INDEPENDENT_AMBULATORY_CARE_PROVIDER_SITE_OTHER): Payer: Self-pay

## 2020-05-07 VITALS — BP 130/90 | HR 79 | Temp 97.7°F | Ht 61.0 in | Wt 156.0 lb

## 2020-05-07 DIAGNOSIS — N898 Other specified noninflammatory disorders of vagina: Secondary | ICD-10-CM | POA: Insufficient documentation

## 2020-05-07 HISTORY — DX: Other specified noninflammatory disorders of vagina: N89.8

## 2020-05-07 NOTE — Progress Notes (Signed)
Pt presented to office for visit with PCP due to unforeseen circumstances patient was switched to nurse visit only. Maryjean Morn, CMA

## 2020-05-08 ENCOUNTER — Telehealth: Payer: Self-pay | Admitting: Primary Care

## 2020-05-08 ENCOUNTER — Other Ambulatory Visit (INDEPENDENT_AMBULATORY_CARE_PROVIDER_SITE_OTHER): Payer: Self-pay | Admitting: Primary Care

## 2020-05-08 LAB — CERVICOVAGINAL ANCILLARY ONLY
Bacterial Vaginitis (gardnerella): POSITIVE — AB
Candida Glabrata: NEGATIVE
Candida Vaginitis: POSITIVE — AB
Chlamydia: NEGATIVE
Comment: NEGATIVE
Comment: NEGATIVE
Comment: NEGATIVE
Comment: NEGATIVE
Comment: NEGATIVE
Comment: NORMAL
Neisseria Gonorrhea: NEGATIVE
Trichomonas: NEGATIVE

## 2020-05-08 MED ORDER — FLUCONAZOLE 150 MG PO TABS: 150.0000 mg | ORAL_TABLET | Freq: Once | ORAL | 1 refills | Status: AC

## 2020-05-08 MED ORDER — METRONIDAZOLE 500 MG PO TABS: 500.0000 mg | ORAL_TABLET | Freq: Two times a day (BID) | ORAL | 0 refills | Status: DC

## 2020-05-08 NOTE — Telephone Encounter (Signed)
Copied from CRM [id number]. Topic: General - Other >> May 08, 2020 12:54 PM Dalphine Handing A wrote: Patient would like a callback to go over lab resutls

## 2020-05-09 ENCOUNTER — Telehealth (INDEPENDENT_AMBULATORY_CARE_PROVIDER_SITE_OTHER): Payer: Self-pay

## 2020-05-09 NOTE — Telephone Encounter (Signed)
Patient is aware of positive BV and yeast. Medications sent. Maryjean Morn, CMA

## 2020-05-09 NOTE — Telephone Encounter (Signed)
-----   Message from Grayce Sessions, NP sent at 05/08/2020  2:48 PM EDT ----- Patient has BV and yeast medication sent in

## 2020-05-16 NOTE — Telephone Encounter (Signed)
Pt called and is requesting to have a nurse give her a call back regarding her BV results. She states that she does not understand what this means. Please advise.

## 2020-05-17 ENCOUNTER — Other Ambulatory Visit: Payer: Self-pay | Admitting: Endocrinology

## 2020-05-21 NOTE — Telephone Encounter (Signed)
Spoke with patient and she no longer had any questions or concerns.

## 2020-06-12 ENCOUNTER — Telehealth (INDEPENDENT_AMBULATORY_CARE_PROVIDER_SITE_OTHER): Payer: Self-pay

## 2020-06-12 NOTE — Telephone Encounter (Signed)
Copied from CRM [id number]. Topic: General - Inquiry >> Jun 12, 2020  3:46 PM Adrian Prince D wrote: Reason for CRM: Patient would like a return call from the nurse. She can be reached at [phone]. Please advise  Please advise

## 2020-06-30 ENCOUNTER — Encounter (HOSPITAL_COMMUNITY): Payer: Self-pay

## 2020-06-30 ENCOUNTER — Other Ambulatory Visit: Payer: Self-pay

## 2020-06-30 ENCOUNTER — Emergency Department (HOSPITAL_COMMUNITY)
Admission: EM | Admit: 2020-06-30 | Discharge: 2020-06-30 | Disposition: A | Discharge status: Home / Self Care | Payer: Self-pay | Attending: Emergency Medicine | Admitting: Emergency Medicine

## 2020-06-30 DIAGNOSIS — Y906 Blood alcohol level of 120-199 mg/100 ml: Secondary | ICD-10-CM | POA: Insufficient documentation

## 2020-06-30 DIAGNOSIS — I10 Essential (primary) hypertension: Secondary | ICD-10-CM | POA: Insufficient documentation

## 2020-06-30 DIAGNOSIS — F10920 Alcohol use, unspecified with intoxication, uncomplicated: Secondary | ICD-10-CM

## 2020-06-30 DIAGNOSIS — F10120 Alcohol abuse with intoxication, uncomplicated: Secondary | ICD-10-CM | POA: Insufficient documentation

## 2020-06-30 DIAGNOSIS — E039 Hypothyroidism, unspecified: Secondary | ICD-10-CM | POA: Insufficient documentation

## 2020-06-30 DIAGNOSIS — R000 Tachycardia, unspecified: Secondary | ICD-10-CM | POA: Insufficient documentation

## 2020-06-30 HISTORY — DX: Depression, unspecified: F32.A

## 2020-06-30 HISTORY — DX: Hypothyroidism, unspecified: E03.9

## 2020-06-30 LAB — ETHANOL: Alcohol, Ethyl (B): 161 mg/dL — ABNORMAL HIGH (ref <10)

## 2020-06-30 MED ORDER — LORAZEPAM 2 MG/ML IJ SOLN: 0.5000 mg | Freq: Once | INTRAMUSCULAR | Status: DC

## 2020-06-30 NOTE — ED Notes (Signed)
Pt able to answer questions and follow commands. Sister states pt is back to her base line

## 2020-06-30 NOTE — ED Notes (Signed)
Family at bedside. 

## 2020-06-30 NOTE — ED Provider Notes (Signed)
[facility]-EMERGENCY DEPT Provider Note   CSN: [id number] Arrival date & time: 06/30/20  0232     History Chief Complaint  Patient presents with  . Loss of Consciousness    Per ems pt was at home drinking and passed out.     Nicole Keller is a 29 y.o. female.  HPI     This is a 29 year old female with a history of depression, hypertension, hypothyroidism who presents by EMS with alcohol intoxication.  Per EMS reports, patient drinking a significant amount of alcohol.  History of anxiety.  Patient has not provided any history since arrival.  Level 5 caveat  Past Medical History:  Diagnosis Date  . Depression   . Hypertension   . Hypothyroid     There are no problems to display for this patient.   NO reported PMH  OB History   No obstetric history on file.     No family history on file.  Social History   Tobacco Use  . Smoking status: Never Smoker  . Smokeless tobacco: Never Used  Substance Use Topics  . Alcohol use: Yes  . Drug use: Not Currently    Home Medications Prior to Admission medications   Not on File    Allergies    Patient has no known allergies.  Review of Systems   Review of Systems  Unable to perform ROS: Mental status change    Physical Exam Updated Vital Signs BP 109/74   Pulse 95   Temp (!) 97.5 F (36.4 C) (Axillary)   Resp 18   SpO2 97%   Physical Exam Vitals and nursing note reviewed.  Constitutional:      General: She is not in acute distress.    Appearance: She is well-developed and well-nourished.     Comments: Somnolent but arousable to voice  HENT:     Head: Normocephalic and atraumatic.     Mouth/Throat:     Mouth: Mucous membranes are moist.  Eyes:     Pupils: Pupils are equal, round, and reactive to light.  Cardiovascular:     Rate and Rhythm: Regular rhythm. Tachycardia present.     Heart sounds: Normal heart sounds.  Pulmonary:     Effort: Pulmonary effort is normal. No  respiratory distress.     Breath sounds: No stridor. No wheezing.     Comments: Tachypnea Abdominal:     General: Bowel sounds are normal.     Palpations: Abdomen is soft.     Tenderness: There is no abdominal tenderness.  Musculoskeletal:     Cervical back: Neck supple.     Right lower leg: No edema.     Left lower leg: No edema.  Skin:    General: Skin is warm and dry.  Neurological:     Comments: Arousable to voice, moves all 4 extremities spontaneously  Psychiatric:        Mood and Affect: Mood and affect normal.     Comments: Unable to assess, appears nontoxic     ED Results / Procedures / Treatments   Labs (all labs ordered are listed, but only abnormal results are displayed) Labs Reviewed  ETHANOL - Abnormal; Notable for the following components:      Result Value   Alcohol, Ethyl (B) 161 (*)    All other components within normal limits    EKG None  Radiology No results found.  Procedures Procedures (including critical care time)  Medications Ordered in ED Medications  LORazepam (ATIVAN)  injection 0.5 mg (0 mg Intravenous Hold 06/30/20 0342)    ED Course  I have reviewed the triage vital signs and the nursing notes.  Pertinent labs & imaging results that were available during my care of the patient were reviewed by me and considered in my medical decision making (see chart for details).  Clinical Course as of 06/30/20 0459  Sun Jun 30, 2020  5638 Patient now awake and alert.  Family is at the bedside.  Patient reports that she feels that she is okay to go home.  Reports taking alcohol tonight.  Denies any illicit drug use.  She reports history of anxiety and "a lot going on." [CH]    Clinical Course User Index [CH] Tyra Gural, Mayer Masker, MD   MDM Rules/Calculators/A&P                          Patient presents after an episode of loss consciousness after presumed alcohol use.  Minimal history on my initial evaluation.  She is significantly tachypneic but  appears awake to voice.  Question anxiety related component.  Initially ordered Ativan; however, tachypnea and physical exam resolved upon arrival of family.  Patient was allowed to sleep.  Alcohol level 161.  See clinical course above.  Patient is now awake, alert.  She reports drinking alcohol but no drug use.  She is clinically at her baseline without any physical complaints.  Will discharge home  After history, exam, and medical workup I feel the patient has been appropriately medically screened and is safe for discharge home. Pertinent diagnoses were discussed with the patient. Patient was given return precautions.  Final Clinical Impression(s) / ED Diagnoses Final diagnoses:  Alcoholic intoxication without complication [facility])    Rx / DC Orders ED Discharge Orders    None       Shanedra Lave, Mayer Masker, MD 06/30/20 [phone]

## 2020-06-30 NOTE — ED Notes (Signed)
Pt. Is in bed sleeping

## 2020-07-01 ENCOUNTER — Encounter (INDEPENDENT_AMBULATORY_CARE_PROVIDER_SITE_OTHER): Payer: Self-pay | Admitting: Primary Care

## 2020-07-22 ENCOUNTER — Ambulatory Visit: Payer: Self-pay | Attending: Internal Medicine

## 2020-07-22 DIAGNOSIS — Z23 Encounter for immunization: Secondary | ICD-10-CM

## 2020-07-22 NOTE — Progress Notes (Signed)
   Covid-19 Vaccination Clinic  Name:  Nicole Keller    MRN: [id number] DOB: 03-09-1991  07/22/2020  [name] was observed post Covid-19 immunization for 15 minutes without incident. She was provided with Vaccine Information Sheet and instruction to access the V-Safe system.   [name] was instructed to call 911 with any severe reactions post vaccine: Marland Kitchen Difficulty breathing  . Swelling of face and throat  . A fast heartbeat  . A bad rash all over body  . Dizziness and weakness   Immunizations Administered    Name Date Dose VIS Date Route   Pfizer COVID-19 Vaccine 07/22/2020  4:20 PM 0.3 mL 05/01/2020 Intramuscular   Manufacturer: [employer], [employer]   Lot: [id number]   NDC: [address]-4

## 2020-08-12 ENCOUNTER — Ambulatory Visit: Payer: Self-pay | Attending: Internal Medicine

## 2020-08-12 DIAGNOSIS — Z23 Encounter for immunization: Secondary | ICD-10-CM

## 2020-08-12 NOTE — Progress Notes (Signed)
   Covid-19 Vaccination Clinic  Name:  Nicole Keller    MRN: [id number] DOB: 15-Jan-1991  08/12/2020  [name] was observed post Covid-19 immunization for 15 minutes without incident. She was provided with Vaccine Information Sheet and instruction to access the V-Safe system.   [name] was instructed to call 911 with any severe reactions post vaccine: Marland Kitchen Difficulty breathing  . Swelling of face and throat  . A fast heartbeat  . A bad rash all over body  . Dizziness and weakness   Immunizations Administered    Name Date Dose VIS Date Route   PFIZER Comrnaty(Gray TOP) Covid-19 Vaccine 08/12/2020  4:18 PM 0.3 mL 06/20/2020 Intramuscular   Manufacturer: [employer], [employer]   Lot: [id number]   NDC: [phone]

## 2020-09-11 ENCOUNTER — Other Ambulatory Visit: Payer: Self-pay | Admitting: Endocrinology

## 2020-10-23 IMAGING — DX DG CHEST 1V PORT
1 series · 1 of 1 positions shown · non-contrast
Comparison: Chest x-ray 05/11/2015

CLINICAL DATA: Chest pain for 2 days.

EXAM:
PORTABLE CHEST 1 VIEW

[chest ap]
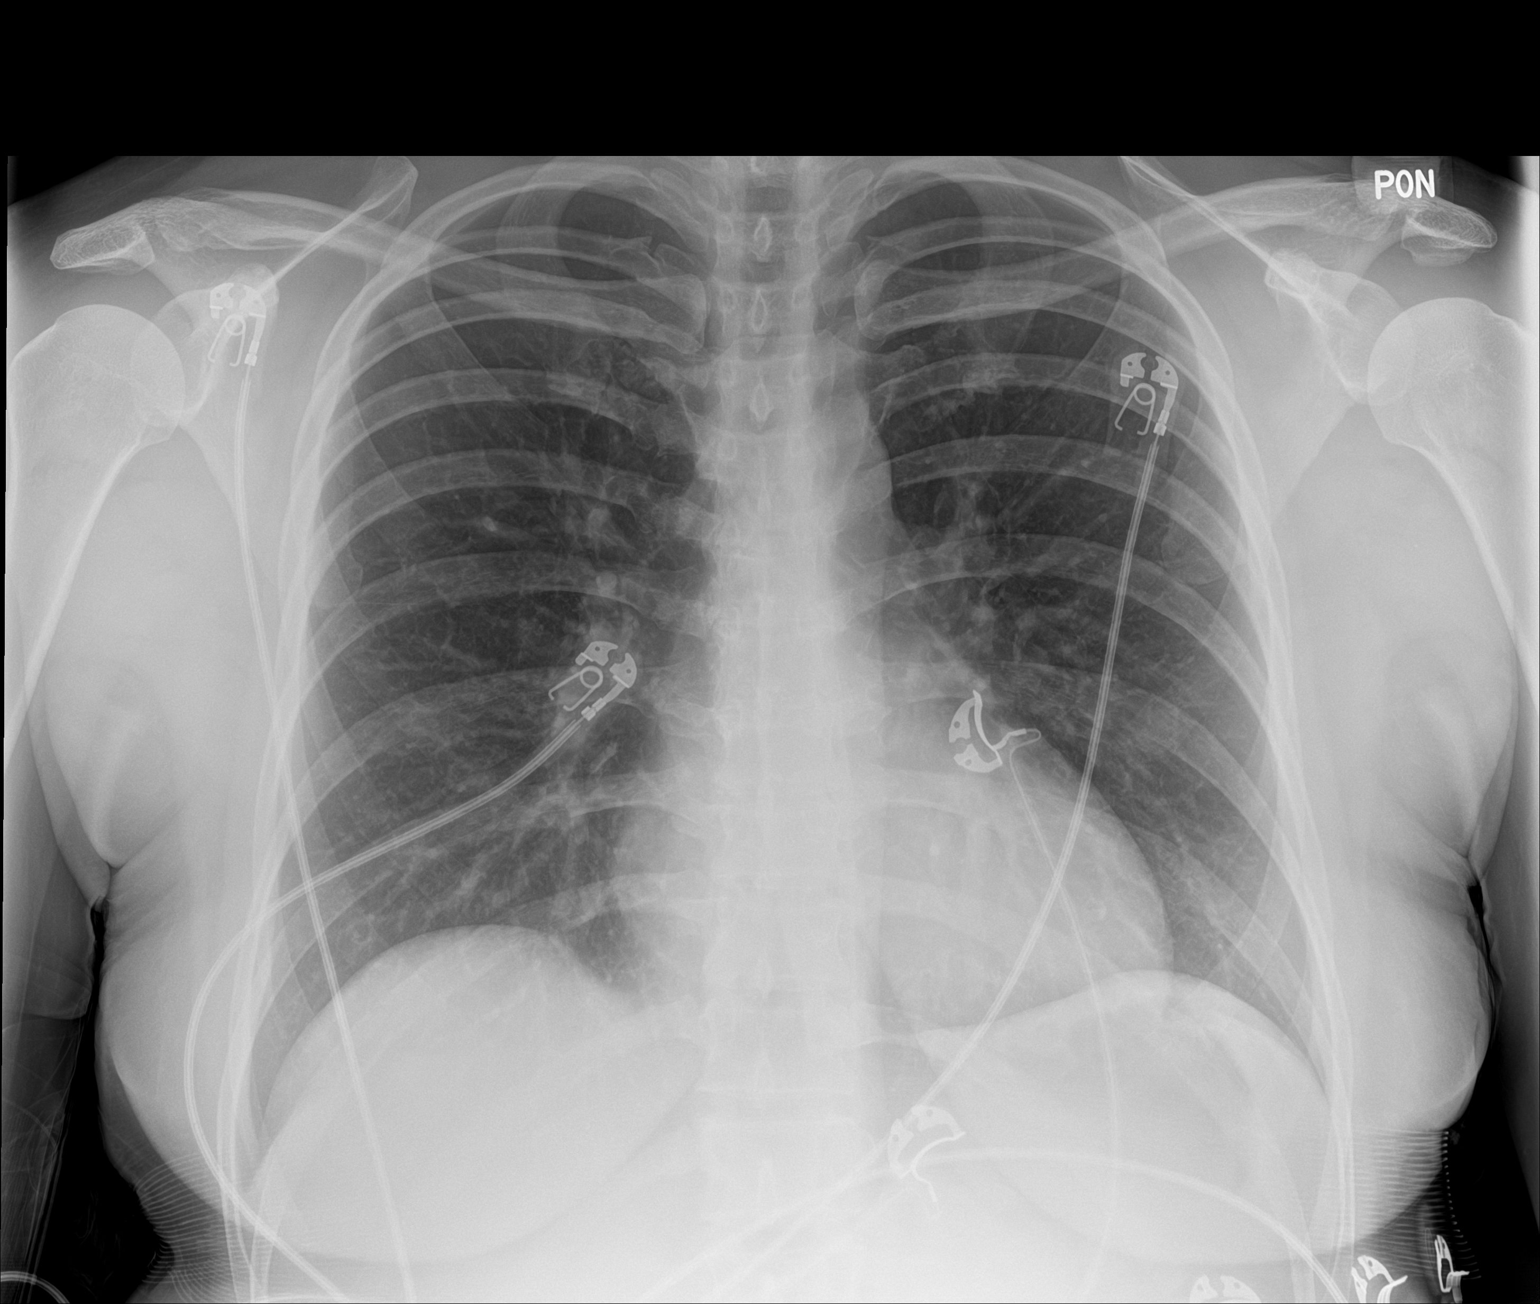

[1 of 1 positions shown; findings below may reference images not displayed]

FINDINGS: The cardiac silhouette, mediastinal and hilar contours are within
normal limits and stable. The lungs are clear. No pleural effusion.
No pneumothorax. The bony thorax is intact.
IMPRESSION: No acute cardiopulmonary findings.

## 2020-11-06 ENCOUNTER — Other Ambulatory Visit: Payer: Self-pay

## 2020-11-06 ENCOUNTER — Ambulatory Visit: Payer: Self-pay | Admitting: Endocrinology

## 2020-11-06 VITALS — BP 120/82 | HR 59 | Ht 61.0 in | Wt 158.0 lb

## 2020-11-06 DIAGNOSIS — E890 Postprocedural hypothyroidism: Secondary | ICD-10-CM

## 2020-11-06 NOTE — Patient Instructions (Addendum)
Thyroid blood tests are requested for you today.  We'll let you know about the results.   It is best to never miss the medication.  However, if you do miss it, next best is to double up the next time.    Please come back for a follow-up appointment in 1 year.   Please call sooner if a pregnancy happens.

## 2020-11-06 NOTE — Progress Notes (Signed)
   Subjective:    Patient ID: Nicole Keller, female    DOB: March 20, 1991, 30 y.o.   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  She takes synthroid 125/d, as rx'ed.  pt states she feels well in general.   Past Medical History:  Diagnosis Date  . Depression   . Hypertension   . Hyperthyroidism   . Hypothyroid     No past surgical history on file.  Social History   Socioeconomic History  . Marital status: Single    Spouse name: Not on file  . Number of children: 2  . Years of education: Not on file  . Highest education level: Not on file  Occupational History  . Not on file  Tobacco Use  . Smoking status: Never Smoker  . Smokeless tobacco: Never Used  Vaping Use  . Vaping Use: Never used  Substance and Sexual Activity  . Alcohol use: Yes  . Drug use: Not Currently  . Sexual activity: Yes    Partners: Male    Birth control/protection: I.U.D.  Other Topics Concern  . Not on file  Social History Narrative   ** Merged History Encounter **       Social Determinants of Health   Financial Resource Strain: Not on file  Food Insecurity: Not on file  Transportation Needs: Not on file  Physical Activity: Not on file  Stress: Not on file  Social Connections: Not on file  Intimate Partner Violence: Not on file    Current Outpatient Medications on File Prior to Visit  Medication Sig Dispense Refill  . levothyroxine (SYNTHROID) 125 MCG tablet TAKE 1 TABLET BY MOUTH EVERY DAY BEFORE BREAKFAST 90 tablet 0  . metroNIDAZOLE (FLAGYL) 500 MG tablet Take 1 tablet (500 mg total) by mouth 2 (two) times daily. (Patient not taking: Reported on 11/06/2020) 14 tablet 0   No current facility-administered medications on file prior to visit.    No Known Allergies  Family History   Problem Relation Age of Onset  . Hypertension Mother   . Thyroid disease Mother   . Hyperlipidemia Father   . Thyroid disease Sister     BP 120/82   Pulse (!) 59   Ht 5\' 1"  (1.549 m)   Wt 158 lb (71.7 kg)   SpO2 97%   BMI 29.85 kg/m    Review of Systems     Objective:   Physical Exam VITAL SIGNS:  See vs page.  GENERAL: no distress.   NECK: There is no palpable thyroid enlargement.  No thyroid nodule is palpable.  No palpable lymphadenopathy at the anterior neck.   Lab Results  Component Value Date   TSH 3.21 11/06/2020   T4TOTAL 8.4 02/23/2018       Assessment & Plan:  Hypothyroidism: well-controlled.  Please continue the same synthroid.

## 2020-11-07 LAB — TSH: TSH: 3.21 u[IU]/mL (ref 0.35–4.50)

## 2020-11-07 LAB — T4, FREE: Free T4: 0.87 ng/dL (ref 0.60–1.60)

## 2021-01-06 ENCOUNTER — Other Ambulatory Visit: Payer: Self-pay | Admitting: Endocrinology

## 2021-04-04 ENCOUNTER — Other Ambulatory Visit: Payer: Self-pay | Admitting: Endocrinology

## 2021-07-30 ENCOUNTER — Ambulatory Visit (INDEPENDENT_AMBULATORY_CARE_PROVIDER_SITE_OTHER): Payer: BC Managed Care – PPO | Admitting: Primary Care

## 2021-07-30 ENCOUNTER — Other Ambulatory Visit: Payer: Self-pay

## 2021-07-30 ENCOUNTER — Encounter (INDEPENDENT_AMBULATORY_CARE_PROVIDER_SITE_OTHER): Payer: Self-pay | Admitting: Primary Care

## 2021-07-30 ENCOUNTER — Other Ambulatory Visit (HOSPITAL_COMMUNITY)
Admission: RE | Admit: 2021-07-30 | Discharge: 2021-07-30 | Disposition: A | Discharge status: Home / Self Care | Payer: BC Managed Care – PPO | Source: Ambulatory Visit | Attending: Primary Care | Admitting: Primary Care

## 2021-07-30 VITALS — BP 135/81 | HR 66 | Temp 98.2°F | Ht 61.0 in | Wt 159.4 lb

## 2021-07-30 DIAGNOSIS — Z0000 Encounter for general adult medical examination without abnormal findings: Secondary | ICD-10-CM

## 2021-07-30 DIAGNOSIS — K5900 Constipation, unspecified: Secondary | ICD-10-CM | POA: Diagnosis not present

## 2021-07-30 DIAGNOSIS — N898 Other specified noninflammatory disorders of vagina: Secondary | ICD-10-CM | POA: Insufficient documentation

## 2021-07-30 DIAGNOSIS — Z131 Encounter for screening for diabetes mellitus: Secondary | ICD-10-CM

## 2021-07-30 DIAGNOSIS — Z124 Encounter for screening for malignant neoplasm of cervix: Secondary | ICD-10-CM | POA: Diagnosis not present

## 2021-07-30 DIAGNOSIS — Z0001 Encounter for general adult medical examination with abnormal findings: Secondary | ICD-10-CM | POA: Diagnosis not present

## 2021-07-30 LAB — POCT GLYCOSYLATED HEMOGLOBIN (HGB A1C): Hemoglobin A1C: 5.4 % (ref 4.0–5.6)

## 2021-07-30 MED ORDER — SENNA-DOCUSATE SODIUM 8.6-50 MG PO TABS: 1.0000 | ORAL_TABLET | Freq: Every day | ORAL | 1 refills | Status: DC

## 2021-07-30 NOTE — Patient Instructions (Addendum)
Pap Test Why am I having this test? A Pap test, also called a Pap smear, is a screening test to check for signs of: Infection. Cancer of the cervix. The cervix is the lower part of the uterus that opens into the vagina. Changes that may be a sign that cancer is developing (precancerous changes). Women need this test on a regular basis. In general, you should have a Pap test every 3 years until you reach menopause or age 33. Women aged 30-60 may choose to have their Pap test done at the same time as an HPV (human papillomavirus) test every 5 years (instead of every 3 years). Your health care provider may recommend having Pap tests more or less often depending on your medical conditions and past Pap test results. What is being tested? Cervical cells are tested for signs of infection or abnormalities. What kind of sample is taken? Your health care provider will collect a sample of cells from the surface of your cervix. This will be done using a small cotton swab, plastic spatula, or brush that is inserted into your vagina using a tool called a speculum. This sample is often collected during a pelvic exam, when you are lying on your back on an exam table with your feet in footrests (stirrups). In some cases, fluids (secretions) from the cervix or vagina may also be collected. How do I prepare for this test? Be aware of where you are in your menstrual cycle. If you are menstruating on the day of the test, you may be asked to reschedule. You may need to reschedule if you have a known vaginal infection on the day of the test. Follow instructions from your health care provider about: Changing or stopping your regular medicines. Some medicines can cause abnormal test results, such as vaginal medicines and tetracycline. Avoiding douching 2-3 days before or the day of the test. Tell a health care provider about: Any allergies you have. All medicines you are taking, including vitamins, herbs, eye drops,  creams, and over-the-counter medicines. Any bleeding problems you have. Any surgeries you have had. Any medical conditions you have. Whether you are pregnant or may be pregnant. How are the results reported? Your test results will be reported as either abnormal or normal. What do the results mean? A normal test result means that you do not have signs of cancer of the cervix. An abnormal result may mean that you have: Cancer. A Pap test by itself is not enough to diagnose cancer. You will have more tests done if cancer is suspected. Precancerous changes in your cervix. Inflammation of the cervix. An STI (sexually transmitted infection). A fungal infection. A parasite infection. Talk with your health care provider about what your results mean. In some cases, your health care provider may do more testing to confirm the results. Questions to ask your health care provider Ask your health care provider, or the department that is doing the test: When will my results be ready? How will I get my results? What are my treatment options? What other tests do I need? What are my next steps? Summary In general, women should have a Pap test every 3 years until they reach menopause or age 30. Your health care provider will collect a sample of cells from the surface of your cervix. This will be done using a small cotton swab, plastic spatula, or brush. In some cases, fluids (secretions) from the cervix or vagina may also be collected. This information is not intended  to replace advice given to you by your health care provider. Make sure you discuss any questions you have with your health care provider. Document Revised: 09/27/2020 Document Reviewed: 09/27/2020 Elsevier Patient Education  [place] OF CHRONIC CONSTIPATION  Drink fluids in the recommended amount everyday. Recommend amount is 8 cups of water daily. Do not replace water with Gatorade or Powerade as these should only  be used when you are dehydrated.  Eat lots of high fiber foods-fruits, veggies, bran and whole grain instead of white bread Be active everyday. Inactivity makes constipation worse. Add psyllium daily (Metamucil) which comes in capsules now. Start very low dose and work up to recommended dose on bottle daily. Stay away from [place] or any magnesium containing laxative, unless you need it to clear things out rarely. It is an addictive laxative and your gut will become dependent on it. If that is not working, I would start Miralax, which you can buy in generic 17 gms daily. It's a powder and not an "addictive laxative". Take it every day and titrate the dose up or down to get the daily Bm. We will consider the use of other pharmacological treatments should the above recommendations prove to be unsuccessful.

## 2021-07-30 NOTE — Progress Notes (Signed)
Pt is fasting 

## 2021-07-30 NOTE — Progress Notes (Signed)
[facility] Patient name: Nicole Keller MRN [id number]  Date of birth: April 24, 1991 Chief Complaint:   Annual Exam and Gynecologic Exam  History of Present Illness:   Nicole Keller is a 31 y.o. No obstetric history on file. female being seen today for a routine well-woman exam.   CC:_0  info@   The current method of family planning is none.  Patient's last menstrual period was 07/11/2021 (exact date). Last pap 11/26/17. Results were: abnormal   Review of Systems:    Denies any headaches, blurred vision, fatigue, shortness of breath, chest pain, abdominal pain, abnormal vaginal discharge/itching/odor/irritation, problems with periods, bowel movements, urination, or intercourse unless otherwise stated above.  Pertinent History Reviewed:   Reviewed past medical,surgical, social and family history.  Reviewed problem list, medications and allergies.  Physical Assessment:   Vitals:   07/30/21 0959  BP: 135/81  Pulse: 66  Temp: 98.2 F (36.8 C)  TempSrc: Oral  SpO2: 98%  Weight: 159 lb 6.4 oz (72.3 kg)  Height: 5' 1" (1.549 m)  Body mass index is 30.12 kg/m.        Physical Examination:  General appearance - well appearing, and in no distress Mental status - alert, oriented to person, place, and time Psych:  She has a normal mood and affect Skin - warm and dry, normal color, no suspicious lesions noted Chest - effort normal, all lung fields clear to auscultation bilaterally Heart - normal rate and regular rhythm Neck:  midline trachea, no thyromegaly or nodules Breasts - breasts appear normal, no suspicious masses, no skin or nipple changes or axillary nodes Educated patient on proper self breast examination and had patient to demonstrate SBE. Abdomen - soft, nontender, nondistended, no masses or organomegaly Pelvic-VULVA: normal appearing vulva with no masses, tenderness or lesions   VAGINA: normal appearing vagina with normal  color and discharge, no lesions   CERVIX: normal appearing cervix without discharge or lesions, no CMT UTERUS: uterus is felt to be normal size, shape, consistency and nontender  ADNEXA: No adnexal masses or tenderness noted. Extremities:  No swelling or varicosities noted  Results for orders placed or performed in visit on 07/30/21 (from the past 24 hour(s))  HgB A1c   Collection Time: 07/30/21 10:16 AM  Result Value Ref Range   Hemoglobin A1C 5.4 4.0 - 5.6 %   HbA1c POC (<> result, manual entry)     HbA1c, POC (prediabetic range)     HbA1c, POC (controlled diabetic range)       Assessment & Plan:  [name] was seen today for annual exam and gynecologic exam.  Diagnoses and all orders for this visit:  Screening for diabetes mellitus -     HgB A1c 5.4 per ADA guidelines she is not pre/diabetic   Cervical cancer screening -     Cytology - PAP([facility])  Vaginal discharge -     Cervicovaginal ancillary only  Annual physical exam -     CBC with Differential -     CMP14+EGFR  Constipation, unspecified constipation type Information provided on AVS managing constipation -     sennosides-docusate sodium (SENOKOT-S) 8.6-50 MG tablet; Take 1 tablet by mouth daily.      Orders Placed This Encounter  Procedures   CBC with Differential   CMP14+EGFR   HgB A1c    Meds:  Meds ordered this encounter  Medications   sennosides-docusate sodium (SENOKOT-S) 8.6-50 MG tablet    Sig: Take 1 tablet by  mouth daily.    Dispense:  90 tablet    Refill:  1    Follow-up: Return if symptoms worsen or fail to improve.  This note has been created with Surveyor, quantity. Any transcriptional errors are unintentional.   Kerin Perna, NP 07/30/2021, 10:33 AM

## 2021-07-31 LAB — CERVICOVAGINAL ANCILLARY ONLY
Candida Glabrata: NEGATIVE
Candida Vaginitis: NEGATIVE
Chlamydia: NEGATIVE
Comment: NEGATIVE
Comment: NEGATIVE
Comment: NEGATIVE
Comment: NEGATIVE
Comment: NORMAL
Neisseria Gonorrhea: NEGATIVE
Trichomonas: NEGATIVE

## 2021-08-01 ENCOUNTER — Telehealth (INDEPENDENT_AMBULATORY_CARE_PROVIDER_SITE_OTHER): Payer: Self-pay

## 2021-08-01 LAB — CBC WITH DIFFERENTIAL/PLATELET
Basophils Absolute: 0.1 10*3/uL (ref 0.0–0.2)
Basos: 1 %
EOS (ABSOLUTE): 0.4 10*3/uL (ref 0.0–0.4)
Eos: 5 %
Hematocrit: 44.6 % (ref 34.0–46.6)
Hemoglobin: 14.8 g/dL (ref 11.1–15.9)
Immature Grans (Abs): 0 10*3/uL (ref 0.0–0.1)
Immature Granulocytes: 0 %
Lymphocytes Absolute: 2.4 10*3/uL (ref 0.7–3.1)
Lymphs: 33 %
MCH: 30.5 pg (ref 26.6–33.0)
MCHC: 33.2 g/dL (ref 31.5–35.7)
MCV: 92 fL (ref 79–97)
Monocytes Absolute: 0.5 10*3/uL (ref 0.1–0.9)
Monocytes: 6 %
Neutrophils Absolute: 3.9 10*3/uL (ref 1.4–7.0)
Neutrophils: 55 %
Platelets: 305 10*3/uL (ref 150–450)
RBC: 4.86 x10E6/uL (ref 3.77–5.28)
RDW: 12.7 % (ref 11.7–15.4)
WBC: 7.2 10*3/uL (ref 3.4–10.8)

## 2021-08-01 LAB — CMP14+EGFR
ALT: 12 IU/L (ref 0–32)
AST: 19 IU/L (ref 0–40)
Albumin/Globulin Ratio: 1.6 (ref 1.2–2.2)
Albumin: 4.6 g/dL (ref 3.8–4.8)
Alkaline Phosphatase: 58 IU/L (ref 44–121)
BUN/Creatinine Ratio: 9 (ref 9–23)
BUN: 7 mg/dL (ref 6–20)
Bilirubin Total: 0.4 mg/dL (ref 0.0–1.2)
CO2: 21 mmol/L (ref 20–29)
Calcium: 9.6 mg/dL (ref 8.7–10.2)
Chloride: 103 mmol/L (ref 96–106)
Creatinine, Ser: 0.8 mg/dL (ref 0.57–1.00)
Globulin, Total: 2.8 g/dL (ref 1.5–4.5)
Glucose: 82 mg/dL (ref 70–99)
Potassium: 4.2 mmol/L (ref 3.5–5.2)
Sodium: 141 mmol/L (ref 134–144)
Total Protein: 7.4 g/dL (ref 6.0–8.5)
eGFR: 100 mL/min/{1.73_m2} (ref >59)

## 2021-08-01 NOTE — Telephone Encounter (Signed)
-----   Message from Kerin Perna, NP sent at 08/01/2021  1:53 PM EST ----- All labs are normal and pap is normal repeat in 3 years

## 2021-08-01 NOTE — Telephone Encounter (Signed)
Patient is aware of normal labs and pap. Nicole Keller, CMA

## 2021-08-04 LAB — CYTOLOGY - PAP
Comment: NEGATIVE
Diagnosis: UNDETERMINED — AB
High risk HPV: NEGATIVE

## 2021-08-18 ENCOUNTER — Other Ambulatory Visit: Payer: Self-pay | Admitting: Endocrinology

## 2021-09-10 ENCOUNTER — Other Ambulatory Visit: Payer: Self-pay

## 2021-09-10 ENCOUNTER — Ambulatory Visit: Payer: BC Managed Care – PPO | Admitting: Endocrinology

## 2021-09-10 VITALS — BP 110/70 | HR 64 | Ht 61.0 in | Wt 161.2 lb

## 2021-09-10 DIAGNOSIS — E890 Postprocedural hypothyroidism: Secondary | ICD-10-CM

## 2021-09-10 LAB — TSH: TSH: 22.5 u[IU]/mL — ABNORMAL HIGH (ref 0.35–5.50)

## 2021-09-10 LAB — T4, FREE: Free T4: 0.82 ng/dL (ref 0.60–1.60)

## 2021-09-10 MED ORDER — LEVOTHYROXINE SODIUM 137 MCG PO TABS
137.0000 ug | ORAL_TABLET | Freq: Every day | ORAL | 3 refills | Status: DC
Start: 2021-09-10 — End: 2021-10-15

## 2021-09-10 NOTE — Patient Instructions (Addendum)
Thyroid blood tests are requested for you today.  We'll let you know about the results.   ?It is best to never miss the medication.  However, if you do miss it, next best is to double up the next time.   ?Please come back for a follow-up appointment in 1 year, or you may choose to follow up with your primary care provider.   ?Please call sooner if a pregnancy happens.   ?

## 2021-09-10 NOTE — Progress Notes (Signed)
? ?  Subjective:  ? ? Patient ID: Nicole Keller, female    DOB: April 27, 1991, 31 y.o.   MRN: [id number] ? 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  She takes synthroid 125/d, as rx'ed.  pt states she feels well in general, except for constipation.   ?Past Medical History:  ?Diagnosis Date  ? Depression   ? Hypertension   ? Hyperthyroidism   ? Hypothyroid   ? ? ?No past surgical history on file. ? ?Social History  ? ?Socioeconomic History  ? Marital status: Single  ?  Spouse name: Not on file  ? Number of children: 2  ? Years of education: Not on file  ? Highest education level: Not on file  ?Occupational History  ? Not on file  ?Tobacco Use  ? Smoking status: Never  ? Smokeless tobacco: Never  ?Vaping Use  ? Vaping Use: Never used  ?Substance and Sexual Activity  ? Alcohol use: Yes  ? Drug use: Not Currently  ? Sexual activity: Yes  ?  Partners: Male  ?  Birth control/protection: I.U.D.  ?Other Topics Concern  ? Not on file  ?Social History Narrative  ? ** Merged History Encounter **  ?    ? ?Social Determinants of Health  ? ?Financial Resource Strain: Not on file  ?Food Insecurity: Not on file  ?Transportation Needs: Not on file  ?Physical Activity: Not on file  ?Stress: Not on file  ?Social Connections: Not on file  ?Intimate Partner Violence: Not on file  ? ? ?Current Outpatient Medications on File Prior to Visit  ?Medication Sig Dispense Refill  ? sennosides-docusate sodium (SENOKOT-S) 8.6-50 MG tablet Take 1 tablet by mouth daily. 90 tablet 1  ? ?No current facility-administered medications on file prior to visit.  ? ? ?No Known Allergies ? ?Family History  ?Problem Relation Age of Onset  ? Hypertension Mother   ? Thyroid disease Mother   ? Hyperlipidemia Father   ? Thyroid disease Sister   ? ? ?BP 110/70   Pulse 64   Ht 5'  1" (1.549 m)   Wt 161 lb 3.2 oz (73.1 kg)   SpO2 96%   BMI 30.46 kg/m?  ? ? ?Review of Systems ?Menses are normal.   ?   ?Objective:  ? Physical Exam ?VITAL SIGNS:  See vs page ?GENERAL: no distress ?NECK: There is no palpable thyroid enlargement.  No thyroid nodule is palpable.  No palpable lymphadenopathy at the anterior neck. ? ?Lab Results  ?Component Value Date  ? TSH 22.50 (H) 09/10/2021  ? T4TOTAL 8.4 02/23/2018  ? ? ?   ?Assessment & Plan:  ?Hypothyroidism: uncontrolled.  I have sent a prescription to your pharmacy, to increase synthroid.  ? ?

## 2021-10-02 ENCOUNTER — Other Ambulatory Visit: Payer: Self-pay

## 2021-10-15 ENCOUNTER — Other Ambulatory Visit: Payer: Self-pay | Admitting: Endocrinology

## 2021-10-15 ENCOUNTER — Other Ambulatory Visit (INDEPENDENT_AMBULATORY_CARE_PROVIDER_SITE_OTHER): Payer: BC Managed Care – PPO

## 2021-10-15 DIAGNOSIS — E890 Postprocedural hypothyroidism: Secondary | ICD-10-CM | POA: Diagnosis not present

## 2021-10-15 LAB — T4, FREE: Free T4: 0.99 ng/dL (ref 0.60–1.60)

## 2021-10-15 LAB — TSH: TSH: 5.79 u[IU]/mL — ABNORMAL HIGH (ref 0.35–5.50)

## 2021-10-15 MED ORDER — LEVOTHYROXINE SODIUM 150 MCG PO TABS: 150.0000 ug | ORAL_TABLET | Freq: Every day | ORAL | 1 refills | Status: DC

## 2021-10-17 ENCOUNTER — Other Ambulatory Visit: Payer: BC Managed Care – PPO

## 2021-11-04 ENCOUNTER — Ambulatory Visit (INDEPENDENT_AMBULATORY_CARE_PROVIDER_SITE_OTHER): Payer: Self-pay

## 2021-11-04 NOTE — Telephone Encounter (Signed)
? ? ?  Chief Complaint: Dizziness ?Symptoms: "I have had a while, but it's getting worse." ?Frequency:  ?Pertinent Negatives: Patient denies  ?Disposition: [] ED /[] Urgent Care (no appt availability in office) / [x] Appointment(In office/virtual)/ []  [facility]/ [] Home Care/ [] Refused Recommended Disposition /[] [facility]/ []  Follow-up with PCP ?Additional Notes:   ?Reason for Disposition ? [1] MODERATE dizziness (e.g., interferes with normal activities) AND [2] has NOT been evaluated by physician for this  (Exception: dizziness caused by heat exposure, sudden standing, or poor fluid intake) ? ?Answer Assessment - Initial Assessment Questions ?1. DESCRIPTION: "Describe your dizziness." ?    Dizzy ?2. LIGHTHEADED: "Do you feel lightheaded?" (e.g., somewhat faint, woozy, weak upon standing) ?    Fainty ?3. VERTIGO: "Do you feel like either you or the room is spinning or tilting?" (i.e. vertigo) ?    No ?4. SEVERITY: "How bad is it?"  "Do you feel like you are going to faint?" "Can you stand and walk?" ?  - MILD: Feels slightly dizzy, but walking normally. ?  - MODERATE: Feels unsteady when walking, but not falling; interferes with normal activities (e.g., school, work). ?  - SEVERE: Unable to walk without falling, or requires assistance to walk without falling; feels like passing out now.  ?    Moderate ?5. ONSET:  "When did the dizziness begin?" ?    Started "awhile back, but getting worse." ?6. AGGRAVATING FACTORS: "Does anything make it worse?" (e.g., standing, change in head position) ?    No ?7. HEART RATE: "Can you tell me your heart rate?" "How many beats in 15 seconds?"  (Note: not all patients can do this)   ?    No ?8. CAUSE: "What do you think is causing the dizziness?" ?    Unsure ?9. RECURRENT SYMPTOM: "Have you had dizziness before?" If Yes, ask: "When was the last time?" "What happened that time?" ?    Yes ?10. OTHER SYMPTOMS: "Do you have any other symptoms?" (e.g., fever, chest  pain, vomiting, diarrhea, bleeding) ?      No ?11. PREGNANCY: "Is there any chance you are pregnant?" "When was your last menstrual period?" ?      no ? ?Protocols used: Dizziness - Lightheadedness-A-AH ? ?

## 2021-11-05 ENCOUNTER — Encounter: Payer: Self-pay | Admitting: Physician Assistant

## 2021-11-05 ENCOUNTER — Ambulatory Visit: Payer: BC Managed Care – PPO | Admitting: Physician Assistant

## 2021-11-05 VITALS — BP 121/81 | HR 61 | Temp 98.7°F | Resp 18 | Ht 62.0 in | Wt 157.0 lb

## 2021-11-05 DIAGNOSIS — R42 Dizziness and giddiness: Secondary | ICD-10-CM | POA: Diagnosis not present

## 2021-11-05 DIAGNOSIS — E890 Postprocedural hypothyroidism: Secondary | ICD-10-CM

## 2021-11-05 MED ORDER — MECLIZINE HCL 12.5 MG PO TABS: 12.5000 mg | ORAL_TABLET | Freq: Three times a day (TID) | ORAL | 0 refills | Status: DC | PRN

## 2021-11-05 NOTE — Progress Notes (Signed)
? ?Established Patient Office Visit ? ?Subjective   ?Patient ID: Nicole Keller, female    DOB: [birth year]  Age: 31 y.o. MRN: [id number] ? ?Chief Complaint  ?Patient presents with  ? Dizziness  ? ? ?States that she started having episodes of dizziness on Monday 2 days ago.  States that she felt like everything around her was moving, felt like she may pass out.  States that she was at a grocery store, went in and ate an apple and started feeling a little relief.  States that she felt weak and tired for the rest of the day. ? ?States that she has continued to have intermittent episodes of dizziness since then. ? ?States that she does drink plenty of water on a daily basis, states that she has been working on weight loss, but does describe her food intake to be well-balanced.  States that she is unsure if her dizziness is related to medication increases, states that her level thyroxine has been increased twice very recently. ? ? ?Past Medical History:  ?Diagnosis Date  ? Depression   ? Hypertension   ? Hyperthyroidism   ? Hypothyroid   ? ?Social History  ? ?Socioeconomic History  ? Marital status: Single  ?  Spouse name: Not on file  ? Number of children: 2  ? Years of education: Not on file  ? Highest education level: Not on file  ?Occupational History  ? Not on file  ?Tobacco Use  ? Smoking status: Never  ? Smokeless tobacco: Never  ?Vaping Use  ? Vaping Use: Never used  ?Substance and Sexual Activity  ? Alcohol use: Yes  ? Drug use: Not Currently  ? Sexual activity: Yes  ?  Partners: Male  ?  Birth control/protection: I.U.D.  ?Other Topics Concern  ? Not on file  ?Social History Narrative  ? ** Merged History Encounter **  ?    ? ?Social Determinants of Health  ? ?Financial Resource Strain: Not on file  ?Food Insecurity: Not on file  ?Transportation Needs: Not on file  ?Physical Activity: Not on file  ?Stress: Not on file  ?Social Connections: Not on file  ?Intimate Partner Violence: Not on file  ? ?Family History   ?Problem Relation Age of Onset  ? Hypertension Mother   ? Thyroid disease Mother   ? Hyperlipidemia Father   ? Thyroid disease Sister   ? ?No Known Allergies ?  ? ?Review of Systems  ?Constitutional:  Negative for chills and fever.  ?HENT: Negative.    ?Eyes:  Negative for blurred vision and photophobia.  ?Respiratory:  Negative for shortness of breath.   ?Cardiovascular:  Negative for chest pain and palpitations.  ?Gastrointestinal:  Negative for diarrhea, nausea and vomiting.  ?Genitourinary: Negative.   ?Musculoskeletal: Negative.   ?Skin: Negative.   ?Neurological:  Positive for dizziness. Negative for seizures, loss of consciousness, weakness and headaches.  ?Endo/Heme/Allergies: Negative.   ?Psychiatric/Behavioral: Negative.    ? ?  ?Objective:  ?  ? ?BP 121/81 (BP Location: Left Arm, Patient Position: Sitting, Cuff Size: Normal)   Pulse 61   Temp 98.7 ?F (37.1 ?C) (Oral)   Resp 18   Ht $R'5\' 2"'gj$  (1.575 m)   Wt 157 lb (71.2 kg)   LMP 11/03/2021   SpO2 98%   BMI 28.72 kg/m?  ?BP Readings from Last 3 Encounters:  ?11/05/21 121/81  ?09/10/21 110/70  ?07/30/21 135/81  ? ?Wt Readings from Last 3 Encounters:  ?11/05/21 157 lb (71.2 kg)  ?  09/10/21 161 lb 3.2 oz (73.1 kg)  ?07/30/21 159 lb 6.4 oz (72.3 kg)  ? ?  ? ?Physical Exam ?Vitals and nursing note reviewed.  ?Constitutional:   ?   Appearance: Normal appearance.  ?HENT:  ?   Head: Normocephalic and atraumatic.  ?   Right Ear: External ear normal.  ?   Left Ear: External ear normal.  ?   Nose: Nose normal.  ?   Mouth/Throat:  ?   Mouth: Mucous membranes are moist.  ?   Pharynx: Oropharynx is clear.  ?Eyes:  ?   Extraocular Movements: Extraocular movements intact.  ?   Conjunctiva/sclera: Conjunctivae normal.  ?   Pupils: Pupils are equal, round, and reactive to light.  ?Cardiovascular:  ?   Rate and Rhythm: Normal rate and regular rhythm.  ?   Pulses: Normal pulses.  ?   Heart sounds: Normal heart sounds.  ?Pulmonary:  ?   Effort: Pulmonary effort is normal.   ?   Breath sounds: Normal breath sounds.  ?Musculoskeletal:     ?   General: Normal range of motion.  ?   Cervical back: Normal range of motion and neck supple.  ?Skin: ?   General: Skin is warm and dry.  ?Neurological:  ?   General: No focal deficit present.  ?   Mental Status: She is alert and oriented to person, place, and time.  ?Psychiatric:     ?   Mood and Affect: Mood normal.     ?   Behavior: Behavior normal.     ?   Thought Content: Thought content normal.     ?   Judgment: Judgment normal.  ? ? ? ?No results found for any visits on 11/05/21. ? ?Last CBC ?Lab Results  ?Component Value Date  ? WBC 7.2 07/30/2021  ? HGB 14.8 07/30/2021  ? HCT 44.6 07/30/2021  ? MCV 92 07/30/2021  ? MCH 30.5 07/30/2021  ? RDW 12.7 07/30/2021  ? PLT 305 07/30/2021  ? ?Last metabolic panel ?Lab Results  ?Component Value Date  ? GLUCOSE 82 07/30/2021  ? NA 141 07/30/2021  ? K 4.2 07/30/2021  ? CL 103 07/30/2021  ? CO2 21 07/30/2021  ? BUN 7 07/30/2021  ? CREATININE 0.80 07/30/2021  ? EGFR 100 07/30/2021  ? CALCIUM 9.6 07/30/2021  ? PROT 7.4 07/30/2021  ? ALBUMIN 4.6 07/30/2021  ? LABGLOB 2.8 07/30/2021  ? AGRATIO 1.6 07/30/2021  ? BILITOT 0.4 07/30/2021  ? ALKPHOS 58 07/30/2021  ? AST 19 07/30/2021  ? ALT 12 07/30/2021  ? ANIONGAP 10 04/09/2019  ? ?Last thyroid functions ?Lab Results  ?Component Value Date  ? TSH 5.79 (H) 10/15/2021  ? T4TOTAL 8.4 02/23/2018  ? ?  ?  ?Assessment & Plan:  ? ?Problem List Items Addressed This Visit   ? ?  ? Endocrine  ? Hypothyroidism  ? ?Other Visit Diagnoses   ? ? Dizziness and giddiness    -  Primary  ? Relevant Medications  ? meclizine (ANTIVERT) 12.5 MG tablet  ? Other Relevant Orders  ? Thyroid Panel With TSH  ? Basic metabolic panel  ? CBC with Differential/Platelet  ? ?  ?1. Dizziness and giddiness ?Trial meclizine.  Patient education given on supportive care, red flags given for prompt reevaluation. ?- Thyroid Panel With TSH ?- Basic metabolic panel ?- CBC with Differential/Platelet ?-  meclizine (ANTIVERT) 12.5 MG tablet; Take 1 tablet (12.5 mg total) by mouth 3 (three) times daily as needed  for dizziness.  Dispense: 30 tablet; Refill: 0 ? ?2. Postablative hypothyroidism ? ? ?I have reviewed the patient's medical history (PMH, PSH, Social History, Family History, Medications, and allergies) , and have been updated if relevant. I spent 30 minutes reviewing chart and  face to face time with patient. ? ? ? ? ?Return if symptoms worsen or fail to improve.  ? ? ?Anndrea Mihelich S Mayers, PA-C ? ?

## 2021-11-05 NOTE — Patient Instructions (Signed)
You can use meclizine at hours to help you with your dizziness.  Continue to stay well-hydrated and get plenty of rest. ? ?We will call you with today's lab results. ? ?Roney Jaffe, PA-C ?Physician Assistant ?[facility] ?[email or web address] ? ? ?Dizziness ?Dizziness is a common problem. It is a feeling of unsteadiness or light-headedness. You may feel like you are about to faint. Dizziness can lead to injury if you stumble or fall. Anyone can become dizzy, but dizziness is more common in older adults. This condition can be caused by a number of things, including medicines, dehydration, or illness. ?Follow these instructions at home: ?Eating and drinking ? ?Drink enough fluid to keep your urine pale yellow. This helps to keep you from becoming dehydrated. Try to drink more clear fluids, such as water. ?Do not drink alcohol. ?Limit your caffeine intake if told to do so by your health care provider. Check ingredients and nutrition facts to see if a food or beverage contains caffeine. ?Limit your salt (sodium) intake if told to do so by your health care provider. Check ingredients and nutrition facts to see if a food or beverage contains sodium. ?Activity ? ?Avoid making quick movements. ?Rise slowly from chairs and steady yourself until you feel okay. ?In the morning, first sit up on the side of the bed. When you feel okay, stand slowly while you hold onto something until you know that your balance is good. ?If you need to stand in one place for a long time, move your legs often. Tighten and relax the muscles in your legs while you are standing. ?Do not drive or use machinery if you feel dizzy. ?Avoid bending down if you feel dizzy. Place items in your home so that they are easy for you to reach without leaning over. ?Lifestyle ?Do not use any products that contain nicotine or tobacco. These products include cigarettes, chewing tobacco, and vaping devices, such  as e-cigarettes. If you need help quitting, ask your health care provider. ?Try to reduce your stress level by using methods such as yoga or meditation. Talk with your health care provider if you need help to manage your stress. ?General instructions ?Watch your dizziness for any changes. ?Take over-the-counter and prescription medicines only as told by your health care provider. Talk with your health care provider if you think that your dizziness is caused by a medicine that you are taking. ?Tell a friend or a family member that you are feeling dizzy. If he or she notices any changes in your behavior, have this person call your health care provider. ?Keep all follow-up visits. This is important. ?Contact a health care provider if: ?Your dizziness does not go away or you have new symptoms. ?Your dizziness or light-headedness gets worse. ?You feel nauseous. ?You have reduced hearing. ?You have a fever. ?You have neck pain or a stiff neck. ?Your dizziness leads to an injury or a fall. ?Get help right away if: ?You vomit or have diarrhea and are unable to eat or drink anything. ?You have problems talking, walking, swallowing, or using your arms, hands, or legs. ?You feel generally weak. ?You have any bleeding. ?You are not thinking clearly or you have trouble forming sentences. It may take a friend or family member to notice this. ?You have chest pain, abdominal pain, shortness of breath, or sweating. ?Your vision changes or you develop a severe headache. ?These symptoms may represent a serious problem that is an emergency. Do not wait to see  if the symptoms will go away. Get medical help right away. Call your local emergency services (911 in the [place] Do not drive yourself to the hospital. ?Summary ?Dizziness is a feeling of unsteadiness or light-headedness. This condition can be caused by a number of things, including medicines, dehydration, or illness. ?Anyone can become dizzy, but dizziness is more common in older  adults. ?Drink enough fluid to keep your urine pale yellow. Do not drink alcohol. ?Avoid making quick movements if you feel dizzy. Monitor your dizziness for any changes. ?This information is not intended to replace advice given to you by your health care provider. Make sure you discuss any questions you have with your health care provider. ?Document Revised: 06/03/2020 Document Reviewed: 06/03/2020 ?Elsevier Patient Education ? 2023 Elsevier Inc. ? ?

## 2021-11-06 ENCOUNTER — Ambulatory Visit: Payer: Self-pay | Admitting: Endocrinology

## 2021-11-06 LAB — BASIC METABOLIC PANEL
BUN/Creatinine Ratio: 12 (ref 9–23)
BUN: 8 mg/dL (ref 6–20)
CO2: 23 mmol/L (ref 20–29)
Calcium: 9.3 mg/dL (ref 8.7–10.2)
Chloride: 103 mmol/L (ref 96–106)
Creatinine, Ser: 0.69 mg/dL (ref 0.57–1.00)
Glucose: 85 mg/dL (ref 70–99)
Potassium: 4.3 mmol/L (ref 3.5–5.2)
Sodium: 140 mmol/L (ref 134–144)
eGFR: 118 mL/min/{1.73_m2} (ref >59)

## 2021-11-06 LAB — CBC WITH DIFFERENTIAL/PLATELET
Basophils Absolute: 0.1 10*3/uL (ref 0.0–0.2)
Basos: 2 %
EOS (ABSOLUTE): 0.4 10*3/uL (ref 0.0–0.4)
Eos: 5 %
Hematocrit: 44 % (ref 34.0–46.6)
Hemoglobin: 14.7 g/dL (ref 11.1–15.9)
Immature Grans (Abs): 0 10*3/uL (ref 0.0–0.1)
Immature Granulocytes: 0 %
Lymphocytes Absolute: 2.6 10*3/uL (ref 0.7–3.1)
Lymphs: 33 %
MCH: 30.6 pg (ref 26.6–33.0)
MCHC: 33.4 g/dL (ref 31.5–35.7)
MCV: 92 fL (ref 79–97)
Monocytes Absolute: 0.6 10*3/uL (ref 0.1–0.9)
Monocytes: 8 %
Neutrophils Absolute: 4.1 10*3/uL (ref 1.4–7.0)
Neutrophils: 52 %
Platelets: 346 10*3/uL (ref 150–450)
RBC: 4.81 x10E6/uL (ref 3.77–5.28)
RDW: 12.3 % (ref 11.7–15.4)
WBC: 7.8 10*3/uL (ref 3.4–10.8)

## 2021-11-06 LAB — THYROID PANEL WITH TSH
Free Thyroxine Index: 2.8 (ref 1.2–4.9)
T3 Uptake Ratio: 33 % (ref 24–39)
T4, Total: 8.5 ug/dL (ref 4.5–12.0)
TSH: 1.92 u[IU]/mL (ref 0.450–4.500)

## 2021-11-07 ENCOUNTER — Telehealth: Payer: Self-pay | Admitting: *Deleted

## 2021-11-07 NOTE — Telephone Encounter (Signed)
-----   Message from Kennieth Rad, [place] sent at 11/06/2021  8:07 AM EDT ----- ?Please call patient and let her know that her thyroid function and kidney function are within normal limits.  She does not show signs of anemia.  Her labs do not show any explanation to her concern of dizziness. ?

## 2021-11-07 NOTE — Telephone Encounter (Signed)
Patient verified DOB ?Patient is aware of labs being normal and no explanation for episodes of dizziness. Patient advised to journal episodes and increase water Patient scheduled for FU with PCP on 5/26 at 10:30am ?

## 2021-12-05 ENCOUNTER — Ambulatory Visit (INDEPENDENT_AMBULATORY_CARE_PROVIDER_SITE_OTHER): Payer: BC Managed Care – PPO | Admitting: Primary Care

## 2022-03-23 ENCOUNTER — Telehealth (INDEPENDENT_AMBULATORY_CARE_PROVIDER_SITE_OTHER): Payer: Self-pay | Admitting: Primary Care

## 2022-03-23 NOTE — Telephone Encounter (Signed)
Returned pt call. Made pt aware she will need to reach out to ENDO in regards to getting her thyroid checked. Pt states she will reach out to ENDO tomorrow. I then asked pt when her cough started. Pt stated her cough started last week. Pt states her voice comes and goes. Pt states her job sent her home to get a COVID test done and results were negative. Pt states she has only taken Nyquil. Pt states she has not taken anything for her cough. Made pt aware that I will send this message to the provider and once she responds I will reach out to her. Pt states she understands and doesn't have any questions or concerns

## 2022-03-23 NOTE — Telephone Encounter (Signed)
Copied from CRM [id number]. Topic: Appointment Scheduling - Scheduling Inquiry for Clinic >> Mar 23, 2022 12:11 PM Teressa P wrote: Reason for CRM: Pt needs to be seen for a cough and to check her thyroid.  The fisrt appt was the 27th.  She will be out of town this wed to Friday  she would like to be seen today or tomorrow  CB@  [phone]

## 2022-03-25 NOTE — Telephone Encounter (Signed)
Returned pt call and went over provider response pt states she is currently of out town. Pt states its just a cough. Pt states if doesn't get better while out of town she will call and make an appt

## 2022-04-21 ENCOUNTER — Ambulatory Visit (INDEPENDENT_AMBULATORY_CARE_PROVIDER_SITE_OTHER): Payer: BC Managed Care – PPO | Admitting: Internal Medicine

## 2022-04-21 ENCOUNTER — Encounter: Payer: Self-pay | Admitting: Internal Medicine

## 2022-04-21 VITALS — BP 104/76 | HR 95 | Ht 62.0 in | Wt 155.0 lb

## 2022-04-21 DIAGNOSIS — E0500 Thyrotoxicosis with diffuse goiter without thyrotoxic crisis or storm: Secondary | ICD-10-CM | POA: Diagnosis not present

## 2022-04-21 DIAGNOSIS — E890 Postprocedural hypothyroidism: Secondary | ICD-10-CM | POA: Diagnosis not present

## 2022-04-21 LAB — TSH: TSH: 0.64 u[IU]/mL (ref 0.35–5.50)

## 2022-04-21 NOTE — Progress Notes (Unsigned)
Name: Nicole Keller  MRN/ DOB: [id number], 1990-09-18    Age/ Sex: 31 y.o., female     PCP: Kerin Perna, NP   Reason for Endocrinology Evaluation: Graves' Disease/postablative hypothyroidism     Initial Endocrinology Clinic Visit: 03/30/2018    PATIENT IDENTIFIER: [name] is a 31 y.o., female with a past medical history of Graves' disease. She has followed with [facility] since 03/30/2018 for consultative assistance with management of her Berenice Primas' disease.   HISTORICAL SUMMARY: The patient was first diagnosed with hyperthyroidism secondary to Graves' disease in 2018.  She was on methimazole between 2018-2019.   She is s/p RAI with 17.4 mCi I-131 sodium iodide orally on 05/29/2018.  She was started on LT-for replacement shortly after.   SUBJECTIVE:    Today (04/21/2022):  [name] is here for a follow-up on postablative hypothyroidism.   Weight has been stable  Constipation has been improving , she is on magnesium  Has chest tightness but no palpitations  Denies palpitations  Denies local neck swelling  She has been having left eye twitching and tinging   LMP last week - regular   She went out of town over the weekend and forgot her levothyroxine, she took it later that day and developed twitching of the face and left eye but no burning or itching of the eye    Levothyroxine 150 mcg daily   HISTORY:  Past Medical History:  Past Medical History:  Diagnosis Date   Depression    Hypertension    Hyperthyroidism    Hypothyroid    Past Surgical History: No past surgical history on file. Social History:  reports that she has never smoked. She has never used smokeless tobacco. She reports current alcohol use. She reports that she does not currently use drugs. Family History:  Family History  Problem Relation Age of Onset   Hypertension Mother    Thyroid disease Mother    Hyperlipidemia Father    Thyroid disease Sister      HOME  MEDICATIONS: Allergies as of 04/21/2022   No Known Allergies      Medication List        Accurate as of April 21, 2022 10:12 AM. If you have any questions, ask your nurse or doctor.          STOP taking these medications    meclizine 12.5 MG tablet Commonly known as: ANTIVERT Stopped by: Dorita Sciara, MD   sennosides-docusate sodium 8.6-50 MG tablet Commonly known as: SENOKOT-S Stopped by: Dorita Sciara, MD       TAKE these medications    levothyroxine 150 MCG tablet Commonly known as: SYNTHROID Take 1 tablet (150 mcg total) by mouth daily.          OBJECTIVE:   PHYSICAL EXAM: VS: BP 104/76 (BP Location: Left Arm, Patient Position: Sitting, Cuff Size: Small)   Pulse 95   Ht 5\' 2"  (1.575 m)   Wt 155 lb (70.3 kg)   BMI 28.35 kg/m    EXAM: General: Pt appears well and is in NAD  Eyes: External eye exam normal without stare, lid lag or exophthalmos.  EOM intact.    Neck: General: Supple without adenopathy. Thyroid:  No goiter or nodules appreciated.  Lungs: Clear with good BS bilat with no rales, rhonchi, or wheezes  Heart: Auscultation: RRR.  Abdomen: Normoactive bowel sounds, soft, nontender, without masses or organomegaly palpable  Extremities:  BL LE: No pretibial edema normal ROM  and strength.  Mental Status: Judgment, insight: Intact Orientation: Oriented to time, place, and person Mood and affect: No depression, anxiety, or agitation     DATA REVIEWED: ***    ASSESSMENT / PLAN / RECOMMENDATIONS:   Postablative Hypothyroidism    - Preconception counseling done by increasing levothyroxine to 2 tabs twice a week and continuing 1 tab rest of the week with a positive pregnancy test   Medications      2. Graves' Disease:  - No extrathyroidal manifestations of Graves' Disease   Signed electronically by: Lyndle Herrlich, MD  [facility] Endocr[facility]  [facility] [address], [address] [place], [place] [address] Phone: [phone] FAX: [phone]      CC: Grayce Sessions, NP [address] [place] [address] Phone: [phone]  Fax: [phone]   Return to Endocrinology clinic as below: No future appointments.

## 2022-04-21 NOTE — Patient Instructions (Signed)

## 2022-04-22 ENCOUNTER — Telehealth: Payer: Self-pay | Admitting: Internal Medicine

## 2022-04-22 DIAGNOSIS — E0500 Thyrotoxicosis with diffuse goiter without thyrotoxic crisis or storm: Secondary | ICD-10-CM | POA: Insufficient documentation

## 2022-04-22 MED ORDER — LEVOTHYROXINE SODIUM 137 MCG PO TABS: 137.0000 ug | ORAL_TABLET | Freq: Every day | ORAL | 3 refills | Status: DC

## 2022-04-22 NOTE — Telephone Encounter (Signed)
Please let the patient know that she is on a bit too much thyroid hormone, and I would recommend reducing the dose  This could explain her symptoms of tingling and twitching   Stop levothyroxine 150 mcg Start levothyroxine 137 mcg daily   Please schedule her for repeat thyroid test in 2 months   Thanks

## 2022-04-22 NOTE — Telephone Encounter (Signed)
Patient informed and expressed her understanding. Also scheduled lab appt

## 2022-05-07 ENCOUNTER — Ambulatory Visit (INDEPENDENT_AMBULATORY_CARE_PROVIDER_SITE_OTHER): Payer: Self-pay | Admitting: *Deleted

## 2022-05-07 NOTE — Telephone Encounter (Signed)
Patient called states that she is cramping really bad and has not had a menstrual cycle. Feels this is because of her levothyroxine change. She did states she has taken 2 pregnancy test and both were negative.

## 2022-05-07 NOTE — Telephone Encounter (Signed)
Reason for Disposition  [1] MODERATE pain (e.g., cramps interfere with normal activities) AND [2] not relieved by ibuprofen or naproxen used per Care Advice  Answer Assessment - Initial Assessment Questions 1. LOCATION: "Where does it hurt?"      Having cramps.   I don't usually cramp with periods.   I'm cramping yesterday bad.  2 periods in Sept and none since.   Pregnancy test is negative.     No bleeding.     I crave food when I'm due my period.   I've not gotten my period.    2. ONSET: "When did this episode of pain begin?"       Last period Sept. 9 and again 28th.     Cramping lower abd 3. SEVERITY: "How bad is the pain?" "Are you missing school or work because of the pain?"  (e.g., Scale 1-10; mild, moderate, or severe)   - MILD (1-3): Doesn't interfere with normal activities, lasting 1 to 2 days.    - MODERATE (4-7): Interferes with normal activities (missing work or school), lasting 2 to 3 days, some associated GI symptoms.    - SEVERE (8-10): Excruciating pain, lasting 2-7 days, associated GI symptoms, pain radiating into thighs and back.     Moderate 4. VAGINAL BLEEDING: "Describe the bleeding that you are having." "How much bleeding is there?"    - SPOTNG: spotting, or pinkish / brownish mucous discharge; does not fill panty liner or pad    - MILD:  less than 1 pad / hour; less than patient's usual menstrual bleeding   - MODERATE: 1-2 pads / hour; 1 menstrual cup every 6 hours; small-medium blood clots (e.g., pea, grape, small coin)   - SEVERE: soaking 2 or more pads/hour for 2 or more hours; 1 menstrual cup every 2 hours; bleeding not contained by pads or continuous red blood from vagina; large blood clots (e.g., golf ball, large coin)      Not bleeding 5. MENSTRUAL HISTORY:  "When did this menstrual period begin?", "Is this a normal period for you?"       Period is regular. 6. LMP:  "When did your last menstrual period begin?"     Sept. I had it twice 7. OTHER SYMPTOMS: "Do you  have any other symptoms?" (e.g., back pain, diarrhea, dizzy or lightheaded, fever, urination pain, vaginal discharge, vomiting)     No 8. PREGNANCY: "Is there any chance you are pregnant?" (e.g., unprotected intercourse, missed birth control pill, broken condom)     No   2 tests are negative  Protocols used: Abdominal Pain - Menstrual Cramps-A-AH

## 2022-05-07 NOTE — Telephone Encounter (Signed)
  Chief Complaint: 2 periods in Sept and none since.   Having menstrual cramps but no period Symptoms: Abd cramping, No period since Sept.   Pregnancy tests are negative     Doesn't usually cramp with her periods Frequency: Problems since having 2 periods in Sept. Pertinent Negatives: Patient denies any bleeding since Sept.   Not pregnant Disposition: [] ED /[] Urgent Care (no appt availability in office) / [x] Appointment(In office/virtual)/ []  [facility]/ [] Home Care/ [] Refused Recommended Disposition /[] [facility]/ []  Follow-up with PCP Additional Notes: Appt made with Juluis Mire, NP for 06/03/2022 at 3:10.

## 2022-05-07 NOTE — Telephone Encounter (Signed)
Patient advised and will follow up with her GYN.

## 2022-06-03 ENCOUNTER — Ambulatory Visit (INDEPENDENT_AMBULATORY_CARE_PROVIDER_SITE_OTHER): Payer: BC Managed Care – PPO | Admitting: Primary Care

## 2022-06-10 ENCOUNTER — Ambulatory Visit (INDEPENDENT_AMBULATORY_CARE_PROVIDER_SITE_OTHER): Payer: Self-pay | Admitting: *Deleted

## 2022-06-10 NOTE — Telephone Encounter (Signed)
Reason for Disposition  Missed 2 or more periods in a row    Period is late this month which is not usual for her  Protocols used: Menstrual Period - Missed or Late-A-AH  Chief Complaint: Period was supposed to start pm Nov. 25 but it has not started.  Usually regular but lately her periods have "been acting weird lately". Symptoms: Also having pain with sex and urinary discomfort.   Having abd pain like cramping before her period. Frequency: Intermittently Pertinent Negatives: Patient denies being pregnant.    Test is negative Disposition: [] ED /[] Urgent Care (no appt availability in office) / [x] Appointment(In office/virtual)/ []  [facility]/ [] Home Care/ [] Refused Recommended Disposition /[] [facility]/ []  Follow-up with PCP Additional Notes: Appt. Made for 06/24/2022 at 3:30 with , NP

## 2022-06-10 NOTE — Telephone Encounter (Signed)
Summary: late period/negative pregnancy test   Pt stated she was supposed to start period on the 25th and has not gotten her period. Pt stated has taken pregnancy test and they have been negative.    Pt mentioned small craps on lower abdomen feels like she is going to start her period .  No appointments available. Pt seeking clinical advice.        Attempted to call patient- no answer and mailbox is full- unable to leave call back message

## 2022-06-22 ENCOUNTER — Other Ambulatory Visit (INDEPENDENT_AMBULATORY_CARE_PROVIDER_SITE_OTHER): Payer: BC Managed Care – PPO

## 2022-06-22 DIAGNOSIS — E890 Postprocedural hypothyroidism: Secondary | ICD-10-CM | POA: Diagnosis not present

## 2022-06-22 LAB — TSH: TSH: 0.5 u[IU]/mL (ref 0.35–5.50)

## 2022-06-23 ENCOUNTER — Encounter: Payer: Self-pay | Admitting: Internal Medicine

## 2022-06-24 ENCOUNTER — Encounter (INDEPENDENT_AMBULATORY_CARE_PROVIDER_SITE_OTHER): Payer: Self-pay | Admitting: Primary Care

## 2022-06-24 ENCOUNTER — Ambulatory Visit (INDEPENDENT_AMBULATORY_CARE_PROVIDER_SITE_OTHER): Payer: BC Managed Care – PPO | Admitting: Primary Care

## 2022-06-24 VITALS — BP 121/84 | HR 64 | Resp 16 | Ht 61.0 in | Wt 155.4 lb

## 2022-06-24 DIAGNOSIS — E663 Overweight: Secondary | ICD-10-CM

## 2022-06-24 DIAGNOSIS — R300 Dysuria: Secondary | ICD-10-CM | POA: Diagnosis not present

## 2022-06-24 LAB — POCT URINALYSIS DIP (CLINITEK)
Bilirubin, UA: NEGATIVE
Blood, UA: NEGATIVE
Glucose, UA: NEGATIVE mg/dL
Ketones, POC UA: NEGATIVE mg/dL
Leukocytes, UA: NEGATIVE
Nitrite, UA: NEGATIVE
POC PROTEIN,UA: NEGATIVE
Spec Grav, UA: 1.02 (ref 1.010–1.025)
Urobilinogen, UA: 0.2 E.U./dL
pH, UA: 7.5 (ref 5.0–8.0)

## 2022-06-28 NOTE — Progress Notes (Signed)
EZM:OQHUTML, Kinnie Scales, NP Chief Complaint  Patient presents with   Dysuria   Menstrual Problem    Current Issues:  Presents with 3 days of dysuria Associated symptoms include:  dysuria, lower abdominal pain, and urinary frequency  There is a previous history of of similar symptoms. Sexually active:  Yes with female.   No concern for STI.  Prior to Admission medications   Medication Sig Start Date End Date Taking? Authorizing Provider  levothyroxine (SYNTHROID) 137 MCG tablet Take 1 tablet (137 mcg total) by mouth daily before breakfast. 04/22/22   Shamleffer, Konrad Dolores, MD    Review of Systems:Comprehensive ROS Pertinent positive and negative noted in HPI    PE:  Blood Pressure 121/84   Pulse 64   Respiration 16   Height 5\' 1"  (1.549 m)   Weight 155 lb 6.4 oz (70.5 kg)   Oxygen Saturation 98%   Body Mass Index 29.36 kg/m  Constitutional normal alert and oriented Heart normal sinus rhythm Lungs clear to auscultation Back no curvature or CVA tenderness Abdomen soft tender with palpation Pelvic deferred UA  Results for orders placed or performed in visit on 06/24/22  POCT URINALYSIS DIP (CLINITEK)  Result Value Ref Range   Color, UA yellow yellow   Clarity, UA clear clear   Glucose, UA negative negative mg/dL   Bilirubin, UA negative negative   Ketones, POC UA negative negative mg/dL   Spec Grav, UA 06/26/22 4.650 - 1.025   Blood, UA negative negative   pH, UA 7.5 5.0 - 8.0   POC PROTEIN,UA negative negative, trace   Urobilinogen, UA 0.2 0.2 or 1.0 E.U./dL   Nitrite, UA Negative Negative   Leukocytes, UA Negative Negative    Assessment and Plan:   1. Dysuria  - POCT URINALYSIS DIP (CLINITEK) Negative for UTI  2.  Overweight Discussed diet and exercise for person with BMI >25. Instructed: You must burn more calories than you eat. Losing 5 percent of your body weight should be considered a success. In the longer term, losing more than 15 percent of your body  weight and staying at this weight is an extremely good result. However, keep in mind that even losing 5 percent of your body weight leads to important health benefits, so try not to get discouraged if you're not able to lose more than this. Will recheck weight in 3-6 months.   3.546

## 2022-07-13 NOTE — L&D Delivery Note (Signed)
OB/GYN Faculty Practice Delivery Note  Nicole Keller is a 32 y.o. G3P2002 s/p SVD at [redacted]w[redacted]d. She was admitted for PROM.   ROM: 22h 80m with clear fluid GBS Status:  Negative/-- (09/04 1624) Maximum Maternal Temperature: 98.74F  Labor Progress: Initial SVE: 0.5/thick/-3. PROM on presentation. Augmented with pitocin. She then progressed to complete at 2244.   Delivery Date/Time: 04/09/2023 @2253  Delivery: Called to room and patient was complete and pushing. Head delivered LOA. No nuchal cord present. Shoulder and body delivered in usual fashion. Infant with spontaneous cry, placed on mother's abdomen, dried and stimulated. Cord clamped x 2 after 1-minute delay, and cut by FOB. Cord blood drawn. Placenta delivered spontaneously with gentle cord traction. Fundus firm with massage and Pitocin. Labia, perineum, vagina, and cervix inspected without lesion.   Baby Weight: 3770g  Placenta: 3 vessel, intact. Sent to L&D Complications: None Lacerations: none EBL: 100 mL Analgesia: Epidural   Infant:  APGAR (1 MIN): 9  APGAR (5 MINS): 9   Wyn Forster, MD OB Family Medicine Fellow, [facility] for [facility], [facility] 04/09/2023, 11:03 PM

## 2022-08-11 ENCOUNTER — Telehealth: Payer: Self-pay | Admitting: Primary Care

## 2022-08-11 ENCOUNTER — Ambulatory Visit (INDEPENDENT_AMBULATORY_CARE_PROVIDER_SITE_OTHER): Payer: BC Managed Care – PPO

## 2022-08-11 ENCOUNTER — Telehealth (INDEPENDENT_AMBULATORY_CARE_PROVIDER_SITE_OTHER): Payer: Self-pay | Admitting: Primary Care

## 2022-08-11 DIAGNOSIS — N926 Irregular menstruation, unspecified: Secondary | ICD-10-CM | POA: Diagnosis not present

## 2022-08-11 NOTE — Telephone Encounter (Signed)
Pt called back in to follow up. Updated pt's phone number in chart also scheduled pt a nurse visit for today at 1p.

## 2022-08-11 NOTE — Telephone Encounter (Signed)
Please contact pt and schedule nurse visit

## 2022-08-11 NOTE — Telephone Encounter (Signed)
Attempted to reach pt to schedule appt for nurse visit several times- without success. Line out of service.   Copied from [place] [phone]. Topic: Appointment Scheduling - Scheduling Inquiry for Clinic >> Aug 11, 2022  8:17 AM Chapman Fitch wrote: Reason for CRM: pt wants an appt to do a pregnancy test/ can this appt be done with a nurse visit / please advise

## 2022-08-11 NOTE — Telephone Encounter (Signed)
Copied from [place] [id number]. Topic: Appointment Scheduling - Scheduling Inquiry for Clinic >> Aug 11, 2022  8:17 AM Chapman Fitch wrote: Reason for CRM: pt wants an appt to do a pregnancy test/ can this appt be done with a nurse visit / please advise

## 2022-08-12 LAB — HCG, SERUM, QUALITATIVE: hCG,Beta Subunit,Qual,Serum: POSITIVE m[IU]/mL — AB (ref <6)

## 2022-08-13 ENCOUNTER — Other Ambulatory Visit (INDEPENDENT_AMBULATORY_CARE_PROVIDER_SITE_OTHER): Payer: Self-pay | Admitting: Primary Care

## 2022-08-13 DIAGNOSIS — Z3A01 Less than 8 weeks gestation of pregnancy: Secondary | ICD-10-CM

## 2022-08-14 ENCOUNTER — Encounter: Payer: Self-pay | Admitting: Internal Medicine

## 2022-08-14 ENCOUNTER — Ambulatory Visit: Payer: BC Managed Care – PPO | Admitting: Internal Medicine

## 2022-08-14 VITALS — BP 114/76 | HR 68 | Ht 61.0 in | Wt 158.0 lb

## 2022-08-14 DIAGNOSIS — Z3A01 Less than 8 weeks gestation of pregnancy: Secondary | ICD-10-CM | POA: Diagnosis not present

## 2022-08-14 DIAGNOSIS — E0500 Thyrotoxicosis with diffuse goiter without thyrotoxic crisis or storm: Secondary | ICD-10-CM | POA: Diagnosis not present

## 2022-08-14 DIAGNOSIS — E890 Postprocedural hypothyroidism: Secondary | ICD-10-CM

## 2022-08-14 LAB — TSH: TSH: 2.14 u[IU]/mL (ref 0.35–5.50)

## 2022-08-14 MED ORDER — LEVOTHYROXINE SODIUM 137 MCG PO TABS: 137.0000 ug | ORAL_TABLET | ORAL | 3 refills | Status: DC

## 2022-08-14 NOTE — Progress Notes (Signed)
Name: Nicole Keller  MRN/ DOB: [id number], 08-Jul-1991    Age/ Sex: 33 y.o., female     PCP: Kerin Perna, NP   Reason for Endocrinology Evaluation: Graves' Disease/postablative hypothyroidism     Initial Endocrinology Clinic Visit: 03/30/2018    PATIENT IDENTIFIER: Nicole Keller is a 32 y.o., female with a past medical history of Graves' disease. She has followed with [facility] since 03/30/2018 for consultative assistance with management of her Berenice Primas' disease.   HISTORICAL SUMMARY: The patient was first diagnosed with hyperthyroidism secondary to Graves' disease in 2018.  She was on methimazole between 2018-2019.   She is s/p RAI with 17.4 mCi I-131 sodium iodide orally on 05/29/2018.  She was started on LT-for replacement shortly after.   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   Weight has been stable  Continues with constipation  Denies palpitations  Denies local neck swelling  Denies tremors Has hand twitching   She tested positive for pregnancy this last Monday , this is her 3rd pregnancy  She has increase   LMP 07/09/2022 - regular  Currently at approximately 5 weeks of gestation  Approximate DOD10/09/2022  Levothyroxine 137 mcg daily   HISTORY:  Past Medical History:  Past Medical History:  Diagnosis Date   Depression    Hypertension    Hyperthyroidism    Hypothyroid    Past Surgical History: No past surgical history on file. Social History:  reports that she has never smoked. She has never used smokeless tobacco. She reports current alcohol use. She reports that she does not currently use drugs. Family History:  Family History  Problem Relation Age of Onset   Hypertension Mother    Thyroid disease Mother    Hyperlipidemia Father    Thyroid disease Sister      HOME MEDICATIONS: Allergies as of 08/14/2022   No Known Allergies      Medication List         Accurate as of August 14, 2022  9:08 AM. If you have any questions, ask your nurse or doctor.          levothyroxine 137 MCG tablet Commonly known as: SYNTHROID Take 1 tablet (137 mcg total) by mouth daily before breakfast.          OBJECTIVE:   PHYSICAL EXAM: VS: BP 114/76 (BP Location: Left Arm, Patient Position: Sitting, Cuff Size: Normal)   Pulse 68   Ht 5\' 1"  (1.549 m)   Wt 158 lb (71.7 kg)   SpO2 95%   BMI 29.85 kg/m    EXAM: General: Pt appears well and is in NAD  Eyes: External eye exam normal without stare, lid lag or exophthalmos.  EOM intact.    Neck: General: Supple without adenopathy. Thyroid:  No goiter or nodules appreciated.  Lungs: Clear with good BS bilat with no rales, rhonchi, or wheezes  Heart: Auscultation: RRR.  Abdomen: Normoactive bowel sounds, soft, nontender, without masses or organomegaly palpable  Extremities:  BL LE: No pretibial edema normal ROM and strength.  Mental Status: Judgment, insight: Intact Orientation: Oriented to time, place, and person Mood and affect: No depression, anxiety, or agitation     DATA REVIEWED:   Latest Reference Range & Units 08/14/22 09:33  TSH 0.35 - 5.50 uIU/mL 2.14    ASSESSMENT / PLAN / RECOMMENDATIONS:   Postablative Hypothyroidism   -She is clinically euthroid  - She has increased LT-4  replacement appropriately since testing positive for pregnancy  - Pt educated extensively on the correct way to take levothyroxine (first thing in the morning with water, 30 minutes before eating or taking other medications). - Pt encouraged to double dose the following day if she were to miss a dose given long half-life of levothyroxine.   Medications   Continue levothyroxine 137 mcg , 2 tabs on Sundays and Wednesdays, 1 tablet the rest of the week    2. Graves' Disease:  - No extrathyroidal manifestations of Graves' Disease    F/U in 3  months  Labs in 6 weeks     Signed electronically  by: Mack Guise, MD  [facility]  [facility] [address] [place], [place] [address] Phone: [phone] FAX: [phone]      CC: Kerin Perna, NP [place] [address] Phone: [phone]  Fax: [phone]   Return to Endocrinology clinic as below: Future Appointments  Date Time Provider [place]  08/14/2022  9:10 AM Luverta Korte, Melanie Crazier, MD [facility] None  09/03/2022  3:10 PM [place] None  09/17/2022  3:10 PM Shelly Bombard, MD [place] None  04/20/2023  7:30 AM Neysha Criado, Melanie Crazier, MD [facility] None

## 2022-08-14 NOTE — Patient Instructions (Signed)

## 2022-08-18 ENCOUNTER — Telehealth: Payer: Self-pay | Admitting: Internal Medicine

## 2022-08-18 NOTE — Telephone Encounter (Signed)
Patient is calling for lab results.

## 2022-08-18 NOTE — Telephone Encounter (Signed)
My chart message has been sent to patient.

## 2022-09-03 ENCOUNTER — Ambulatory Visit (INDEPENDENT_AMBULATORY_CARE_PROVIDER_SITE_OTHER): Payer: BC Managed Care – PPO

## 2022-09-03 ENCOUNTER — Other Ambulatory Visit (HOSPITAL_COMMUNITY)
Admission: RE | Admit: 2022-09-03 | Discharge: 2022-09-03 | Disposition: A | Discharge status: Home / Self Care | Payer: BC Managed Care – PPO | Source: Ambulatory Visit | Attending: Obstetrics and Gynecology | Admitting: Obstetrics and Gynecology

## 2022-09-03 VITALS — BP 133/81 | HR 67 | Ht 61.0 in | Wt 159.2 lb

## 2022-09-03 DIAGNOSIS — Z3A08 8 weeks gestation of pregnancy: Secondary | ICD-10-CM | POA: Diagnosis not present

## 2022-09-03 DIAGNOSIS — Z3480 Encounter for supervision of other normal pregnancy, unspecified trimester: Secondary | ICD-10-CM

## 2022-09-03 DIAGNOSIS — Z3481 Encounter for supervision of other normal pregnancy, first trimester: Secondary | ICD-10-CM

## 2022-09-03 DIAGNOSIS — O3680X0 Pregnancy with inconclusive fetal viability, not applicable or unspecified: Secondary | ICD-10-CM

## 2022-09-03 NOTE — Progress Notes (Signed)
New OB Intake  I connected with Nicole Keller  on 09/03/22 at  3:10 PM EST by in person and verified that I am speaking with the correct person using two identifiers. Nurse is located at [facility] and pt is located at [place].  I discussed the limitations, risks, security and privacy concerns of performing an evaluation and management service by telephone and the availability of in person appointments. I also discussed with the patient that there may be a patient responsible charge related to this service. The patient expressed understanding and agreed to proceed.  I explained I am completing New OB Intake today. We discussed EDD of 04/09/23 that is based on first trimester 8 week u/s. Pt is G3/P2002. I reviewed her allergies, medications, Medical/Surgical/OB history, and appropriate screenings. I informed her of [facility] services. [facility] information placed in AVS. Based on history, this is a low risk pregnancy.  Patient Active Problem List   Diagnosis Date Noted   Graves disease 04/22/2022   Vaginal discharge 05/07/2020   Hypothyroidism 09/06/2018   Essential hypertension 08/09/2018   Class 1 obesity with serious comorbidity and body mass index (BMI) of 30.0 to 30.9 in adult 01/12/2018   Pap smear abnormality of cervix with LGSIL 12/07/2017   Depression with anxiety 10/12/2017   Tachycardia 10/12/2017    Concerns addressed today  Delivery Plans Plans to deliver at [facility]. Patient given information for [facility] Healthy Baby website for more information about Women's and [place]. Patient is not interested in water birth. Offered upcoming OB visit with CNM to discuss further.  MyChart/Babyscripts MyChart access verified. I explained pt will have some visits in office and some virtually. Babyscripts instructions given and order placed. Patient verifies receipt of registration text/e-mail. Account successfully created and app downloaded.  Blood Pressure Cuff/Weight Scale Patient has private  insurance; instructed to purchase blood pressure cuff and bring to first prenatal appt. Explained after first prenatal appt pt will check weekly and document in 76. Patient does not have weight scale; patient may purchase if they desire to track weight weekly in Babyscripts.  Anatomy US Explained first scheduled [place] will be around 19 weeks. Dating and viability US performed today. Anatomy US TBD.   Labs Discussed Johnsie Cancel genetic screening with patient. Would like both Panorama and Horizon drawn at new OB visit. Routine prenatal labs needed.  COVID Vaccine Patient has had COVID vaccine.   Is patient a CenteringPregnancy candidate?  Declined Declined due to Group setting Not a candidate due to  Partner with language barrier If accepted,     Social Determinants of Health Food Insecurity: Patient denies food insecurity. WIC Referral: Patient is interested in referral to [facility].  Transportation: Patient denies transportation needs. Childcare: Discussed no children allowed at ultrasound appointments. Offered childcare services; patient declines childcare services at this time.  Interested in [place]? If yes, send referral.   First visit review I reviewed new OB appt with patient. I explained they will have a provider visit that includes pap smear, genetic screening, and discuss plan of care for pregnancy. Explained pt will be seen by Baltazar Najjar at first visit; encounter routed to appropriate provider. Explained that patient will be seen by pregnancy navigator following visit with provider.   Lucianne Lei, RN 09/03/2022  2:49 PM

## 2022-09-04 ENCOUNTER — Encounter: Payer: Self-pay | Admitting: Obstetrics and Gynecology

## 2022-09-04 DIAGNOSIS — Z2839 Other underimmunization status: Secondary | ICD-10-CM | POA: Insufficient documentation

## 2022-09-04 DIAGNOSIS — O09899 Supervision of other high risk pregnancies, unspecified trimester: Secondary | ICD-10-CM | POA: Insufficient documentation

## 2022-09-04 LAB — CBC/D/PLT+RPR+RH+ABO+RUBIGG...
Antibody Screen: NEGATIVE
Basophils Absolute: 0.1 10*3/uL (ref 0.0–0.2)
Basos: 1 %
EOS (ABSOLUTE): 0.3 10*3/uL (ref 0.0–0.4)
Eos: 2 %
HCV Ab: NONREACTIVE
HIV Screen 4th Generation wRfx: NONREACTIVE
Hematocrit: 42.7 % (ref 34.0–46.6)
Hemoglobin: 14.7 g/dL (ref 11.1–15.9)
Hepatitis B Surface Ag: NEGATIVE
Immature Grans (Abs): 0.1 10*3/uL (ref 0.0–0.1)
Immature Granulocytes: 1 %
Lymphocytes Absolute: 3.1 10*3/uL (ref 0.7–3.1)
Lymphs: 23 %
MCH: 30.9 pg (ref 26.6–33.0)
MCHC: 34.4 g/dL (ref 31.5–35.7)
MCV: 90 fL (ref 79–97)
Monocytes Absolute: 1 10*3/uL — ABNORMAL HIGH (ref 0.1–0.9)
Monocytes: 7 %
Neutrophils Absolute: 8.8 10*3/uL — ABNORMAL HIGH (ref 1.4–7.0)
Neutrophils: 66 %
Platelets: 355 10*3/uL (ref 150–450)
RBC: 4.76 x10E6/uL (ref 3.77–5.28)
RDW: 12.7 % (ref 11.7–15.4)
RPR Ser Ql: NONREACTIVE
Rh Factor: POSITIVE
Rubella Antibodies, IGG: <0.9 index — ABNORMAL LOW (ref >0.99)
WBC: 13.4 10*3/uL — ABNORMAL HIGH (ref 3.4–10.8)

## 2022-09-04 LAB — CERVICOVAGINAL ANCILLARY ONLY
Chlamydia: NEGATIVE
Comment: NEGATIVE
Comment: NEGATIVE
Comment: NORMAL
Neisseria Gonorrhea: NEGATIVE
Trichomonas: NEGATIVE

## 2022-09-04 LAB — HCV INTERPRETATION

## 2022-09-05 LAB — URINE CULTURE, OB REFLEX: Organism ID, Bacteria: NO GROWTH

## 2022-09-05 LAB — CULTURE, OB URINE

## 2022-09-14 ENCOUNTER — Telehealth: Payer: Self-pay

## 2022-09-14 NOTE — Telephone Encounter (Signed)
Patient called stating that she has been having some mild lower abdominal cramping. Denies having any bleeding. States that she is feeling better today other than some nausea.  Patient advised to take tylenol for mild cramping, hydrate, and rest as much as possible.  Advised to continue to monitor.

## 2022-09-17 ENCOUNTER — Other Ambulatory Visit (HOSPITAL_COMMUNITY)
Admission: RE | Admit: 2022-09-17 | Discharge: 2022-09-17 | Disposition: A | Discharge status: Home / Self Care | Payer: BC Managed Care – PPO | Source: Ambulatory Visit | Attending: Obstetrics | Admitting: Obstetrics

## 2022-09-17 ENCOUNTER — Ambulatory Visit (INDEPENDENT_AMBULATORY_CARE_PROVIDER_SITE_OTHER): Payer: BC Managed Care – PPO

## 2022-09-17 ENCOUNTER — Encounter: Payer: Self-pay | Admitting: Obstetrics

## 2022-09-17 ENCOUNTER — Ambulatory Visit (INDEPENDENT_AMBULATORY_CARE_PROVIDER_SITE_OTHER): Payer: BC Managed Care – PPO | Admitting: Obstetrics

## 2022-09-17 VITALS — BP 116/78 | HR 69 | Wt 164.0 lb

## 2022-09-17 DIAGNOSIS — Z3480 Encounter for supervision of other normal pregnancy, unspecified trimester: Secondary | ICD-10-CM | POA: Diagnosis not present

## 2022-09-17 DIAGNOSIS — O99281 Endocrine, nutritional and metabolic diseases complicating pregnancy, first trimester: Secondary | ICD-10-CM | POA: Diagnosis not present

## 2022-09-17 DIAGNOSIS — O368390 Maternal care for abnormalities of the fetal heart rate or rhythm, unspecified trimester, not applicable or unspecified: Secondary | ICD-10-CM | POA: Diagnosis not present

## 2022-09-17 DIAGNOSIS — F53 Postpartum depression: Secondary | ICD-10-CM | POA: Diagnosis not present

## 2022-09-17 DIAGNOSIS — O368310 Maternal care for abnormalities of the fetal heart rate or rhythm, first trimester, not applicable or unspecified: Secondary | ICD-10-CM | POA: Diagnosis not present

## 2022-09-17 DIAGNOSIS — Z3A00 Weeks of gestation of pregnancy not specified: Secondary | ICD-10-CM | POA: Insufficient documentation

## 2022-09-17 DIAGNOSIS — Z3A10 10 weeks gestation of pregnancy: Secondary | ICD-10-CM

## 2022-09-17 DIAGNOSIS — E039 Hypothyroidism, unspecified: Secondary | ICD-10-CM

## 2022-09-17 DIAGNOSIS — Z3481 Encounter for supervision of other normal pregnancy, first trimester: Secondary | ICD-10-CM | POA: Diagnosis not present

## 2022-09-17 DIAGNOSIS — O219 Vomiting of pregnancy, unspecified: Secondary | ICD-10-CM | POA: Diagnosis not present

## 2022-09-17 MED ORDER — PROMETHAZINE HCL 25 MG PO TABS: 25.0000 mg | ORAL_TABLET | Freq: Four times a day (QID) | ORAL | 2 refills | Status: DC | PRN

## 2022-09-17 MED ORDER — DOXYLAMINE-PYRIDOXINE 10-10 MG PO TBEC: DELAYED_RELEASE_TABLET | ORAL | 5 refills | Status: DC

## 2022-09-17 NOTE — Progress Notes (Signed)
Subjective:    Nicole Keller is being seen today for her first obstetrical visit.  This is not a planned pregnancy. She is at 65w6dgestation. Her obstetrical history is significant for obesity and hypothyroidism  . Relationship with FOB: significant other, not living together. Patient does intend to breast feed. Pregnancy history fully reviewed.  The information documented in the HPI was reviewed and verified.  Menstrual History: OB History     Gravida  3   Para  2   Term  2   Preterm      AB      Living  2      SAB      IAB      Ectopic      Multiple      Live Births  2            Patient's last menstrual period was 07/09/2022 (exact date).    Past Medical History:  Diagnosis Date   Depression    Hypertension    Hyperthyroidism    Hypothyroid     History reviewed. No pertinent surgical history.  (Not in a hospital admission)  No Known Allergies  Social History   Tobacco Use   Smoking status: Never   Smokeless tobacco: Never  Substance Use Topics   Alcohol use: Not Currently    Comment: occ, not since confirmed pregnancy    Family History  Problem Relation Age of Onset   Hypertension Mother    Thyroid disease Mother    Hyperlipidemia Father    Thyroid disease Sister    Hypertension Maternal Grandmother    Diabetes Paternal Grandmother      Review of Systems Constitutional: negative for weight loss Gastrointestinal: negative for vomiting Genitourinary:negative for genital lesions and vaginal discharge and dysuria Musculoskeletal:negative for back pain Behavioral/Psych: negative for abusive relationship, depression, illegal drug usage and tobacco use    Objective:    BP 116/78   Pulse 69   Wt 164 lb (74.4 kg)   LMP 07/09/2022 (Exact Date)   BMI 30.99 kg/m  General Appearance:    Alert, cooperative, no distress, appears stated age  Head:    Normocephalic, without obvious abnormality, atraumatic  Eyes:    PERRL, conjunctiva/corneas  clear, EOM's intact, fundi    benign, both eyes  Ears:    Normal TM's and external ear canals, both ears  Nose:   Nares normal, septum midline, mucosa normal, no drainage    or sinus tenderness  Throat:   Lips, mucosa, and tongue normal; teeth and gums normal  Neck:   Supple, symmetrical, trachea midline, no adenopathy;    thyroid:  no enlargement/tenderness/nodules; no carotid   bruit or JVD  Back:     Symmetric, no curvature, ROM normal, no CVA tenderness  Lungs:     Clear to auscultation bilaterally, respirations unlabored  Chest Wall:    No tenderness or deformity   Heart:    Regular rate and rhythm, S1 and S2 normal, no murmur, rub   or gallop  Breast Exam:    No tenderness, masses, or nipple abnormality  Abdomen:     Soft, non-tender, bowel sounds active all four quadrants,    no masses, no organomegaly  Genitalia:    Normal female without lesion, discharge or tenderness  Extremities:   Extremities normal, atraumatic, no cyanosis or edema  Pulses:   2+ and symmetric all extremities  Skin:   Skin color, texture, turgor normal, no rashes or lesions  Lymph nodes:   Cervical, supraclavicular, and axillary nodes normal  Neurologic:   CNII-XII intact, normal strength, sensation and reflexes    throughout      Lab Review Urine pregnancy test Labs reviewed yes Radiologic studies reviewed yes  Assessment:    Pregnancy at 57w6dweeks    Plan:    1. Supervision of other normal pregnancy, antepartum Rx: - PANORAMA PRENATAL TEST FULL PANEL - HORIZON CUSTOM - Cytology - PAP( [place])  2. Unable to hear fetal heart tones as reason for ultrasound scan Rx: - UKoreaOB Limited; Future:  FHR=170 bpm  3. Nausea and vomiting in pregnancy prior to [redacted] weeks gestation Rx: - promethazine (PHENERGAN) 25 MG tablet; Take 1 tablet (25 mg total) by mouth every 6 (six) hours as needed for nausea or vomiting.  Dispense: 30 tablet; Refill: 2 - Doxylamine-Pyridoxine (DICLEGIS) 10-10 MG TBEC; 1  tab in AM, 1 tab mid afternoon 2 tabs at bedtime. Max dose 4 tabs daily.  Dispense: 100 tablet; Refill: 5  4. Hypothyroidism affecting pregnancy, antepartum - on Synthroid - clinically stable  5. Depression complicating pregnancy, postpartum - clinically stable  - counseling offered prn   Prenatal vitamins.  Counseling provided regarding continued use of seat belts, cessation of alcohol consumption, smoking or use of illicit drugs; infection precautions i.e., influenza/TDAP immunizations, toxoplasmosis,CMV, parvovirus, listeria and varicella; workplace safety, exercise during pregnancy; routine dental care, safe medications, sexual activity, hot tubs, saunas, pools, travel, caffeine use, fish and methlymercury, potential toxins, hair treatments, varicose veins Weight gain recommendations per IOM guidelines reviewed: underweight/BMI< 18.5--> gain 28 - 40 lbs; normal weight/BMI 18.5 - 24.9--> gain 25 - 35 lbs; overweight/BMI 25 - 29.9--> gain 15 - 25 lbs; obese/BMI >30->gain  11 - 20 lbs Problem list reviewed and updated. FIRST/CF mutation testing/NIPT/QUAD SCREEN/fragile X/Ashkenazi Jewish population testing/Spinal muscular atrophy discussed: requested. Role of ultrasound in pregnancy discussed; fetal survey: requested. Amniocentesis discussed: not indicated.  Meds ordered this encounter  Medications   promethazine (PHENERGAN) 25 MG tablet    Sig: Take 1 tablet (25 mg total) by mouth every 6 (six) hours as needed for nausea or vomiting.    Dispense:  30 tablet    Refill:  2   Doxylamine-Pyridoxine (DICLEGIS) 10-10 MG TBEC    Sig: 1 tab in AM, 1 tab mid afternoon 2 tabs at bedtime. Max dose 4 tabs daily.    Dispense:  100 tablet    Refill:  5   Orders Placed This Encounter  Procedures   PANORAMA PRENATAL TEST FULL PANEL    ==========Department Information========== ID: [id number]:[facility]Dept: [phone] Fax: [phone]    Order Specific Question:   Expected due date (MM/DD/YYYY):    Answer:   04/09/2023    Order Specific Question:   Is this a twin pregnancy? (viable, no vanished twin)    Answer:   No    Order Specific Question:   Is this a surrogate or egg donor pregnancy?    Answer:   No    Order Specific Question:   I want fetal sex included in the report:    Answer:   Yes    Order Specific Question:   Maternal Weight (lbs):    Answer:   153   Order Specific Question:  Which Microdeletion Panel should be ordered?    Answer:   22q11.2 Deletion    Order Specific Question:   What type of billing?    Answer:   Programme researcher, broadcasting/film/video Question:   By placing this electronic order I confirm the testing ordered herein is medically necessary and this patient has been informed of the details of the genetic test(s) ordered, including the risks, benefits, and alternatives, and has consented to testing.    Answer:   Yes    Order Specific Question:   Select an order diagnosis: For additional options refer to [email or web address]    Answer:   Encounter for supervision of other normal pregnancy in first trimester [[id number]]   HORIZON CUSTOM    ==========Department Information========== ID: [id number] Department:[facility] [place] [place] [address] Dept: [phone] Dept Fax: [phone]     Order Specific Question:   Specify the name or ID of a valid Horizon Custom Panel:    Answer:   HBASIC    Order Specific Question:   Is patient pregnant?    Answer:   Yes    Order Specific Question:   Practice ensures that HIPAA consent is obtained and will make available to [facility] upon request?    Answer:   Yes    Order Specific Question:   By placing this electronic  order I confirm the testing ordered herein is medically necessary and this patient has been informed of the details of the genetic test(s) ordered, including the risks, benefits, and alternatives, and has consented to testing.    Answer:   Yes    Order Specific Question:   What type of billing?    Answer:   [employer] Specific Question:   Select an order diagnosis: For additional options refer to [email or web address]    Answer:   Encounter for supervision of other normal pregnancy, first trimester [id number]    Order Specific Question:   Tay-Sachs add-on test?    Answer:   No    Follow up in 5 weeks.  I have spent a total of 25 minutes of face-to-face time, excluding clinical staff time, reviewing notes and preparing to see patient, ordering tests and/or medications, and counseling the patient.    Shelly Bombard, MD 09/17/2022 3:52 PM

## 2022-09-22 LAB — CYTOLOGY - PAP
Comment: NEGATIVE
High risk HPV: NEGATIVE

## 2022-09-23 NOTE — Progress Notes (Signed)
Pt is pregnant. Consulted with Dr. Caron Presume. Recommendation based on past 3 Paps is follow up in one year for repeat Pap. TC to pt. Pt advised of above

## 2022-09-25 LAB — PANORAMA PRENATAL TEST FULL PANEL:PANORAMA TEST PLUS 5 ADDITIONAL MICRODELETIONS: FETAL FRACTION: 9.6

## 2022-09-26 LAB — HORIZON CUSTOM: REPORT SUMMARY: NEGATIVE

## 2022-09-30 ENCOUNTER — Other Ambulatory Visit (INDEPENDENT_AMBULATORY_CARE_PROVIDER_SITE_OTHER): Payer: BC Managed Care – PPO

## 2022-09-30 DIAGNOSIS — E890 Postprocedural hypothyroidism: Secondary | ICD-10-CM

## 2022-09-30 LAB — TSH: TSH: 1.02 u[IU]/mL (ref 0.35–5.50)

## 2022-10-15 ENCOUNTER — Encounter: Payer: Self-pay | Admitting: Student

## 2022-10-15 ENCOUNTER — Ambulatory Visit (INDEPENDENT_AMBULATORY_CARE_PROVIDER_SITE_OTHER): Payer: BC Managed Care – PPO | Admitting: Student

## 2022-10-15 VITALS — BP 114/71 | HR 68 | Wt 166.6 lb

## 2022-10-15 DIAGNOSIS — O99280 Endocrine, nutritional and metabolic diseases complicating pregnancy, unspecified trimester: Secondary | ICD-10-CM

## 2022-10-15 DIAGNOSIS — E039 Hypothyroidism, unspecified: Secondary | ICD-10-CM

## 2022-10-15 DIAGNOSIS — Z3480 Encounter for supervision of other normal pregnancy, unspecified trimester: Secondary | ICD-10-CM

## 2022-10-15 DIAGNOSIS — Z3A14 14 weeks gestation of pregnancy: Secondary | ICD-10-CM

## 2022-10-15 DIAGNOSIS — O99212 Obesity complicating pregnancy, second trimester: Secondary | ICD-10-CM

## 2022-10-15 DIAGNOSIS — O10019 Pre-existing essential hypertension complicating pregnancy, unspecified trimester: Secondary | ICD-10-CM

## 2022-10-15 MED ORDER — ASPIRIN 81 MG PO TBEC: 81.0000 mg | DELAYED_RELEASE_TABLET | Freq: Every day | ORAL | 2 refills | Status: DC

## 2022-10-15 NOTE — Progress Notes (Signed)
   PRENATAL VISIT NOTE  Subjective:  Nicole Keller is a 32 y.o. G3P2002 at [redacted]w[redacted]d being seen today for ongoing prenatal care.  She is currently monitored for the following issues for this low-risk pregnancy and has Depression with anxiety; Tachycardia; Pap smear abnormality of cervix with LGSIL; Class 1 obesity with serious comorbidity and body mass index (BMI) of 30.0 to 30.9 in adult; Essential hypertension; Hypothyroidism; Vaginal discharge; Graves disease; Supervision of other normal pregnancy, antepartum; and Rubella non-immune status, antepartum on their problem list.  Patient reports  swollen feet and hands on occasion .  Denies headache, dizziness, or Contractions: Not present. Vag. Bleeding: None.  Movement: Absent. Denies leaking of fluid.   The following portions of the patient's history were reviewed and updated as appropriate: allergies, current medications, past family history, past medical history, past social history, past surgical history and problem list.   Objective:   Vitals:   10/15/22 1531  BP: 114/71  Pulse: 68  Weight: 166 lb 9.6 oz (75.6 kg)    Fetal Status: Fetal Heart Rate (bpm): 156   Movement: Absent     General:  Alert, oriented and cooperative. Patient is in no acute distress.  Skin: Skin is warm and dry. No rash noted.   Cardiovascular: Normal heart rate noted  Respiratory: Normal respiratory effort, no problems with respiration noted  Abdomen: Soft, gravid, appropriate for gestational age.  Pain/Pressure: Absent     Pelvic: Cervical exam deferred        Extremities: Normal range of motion.  Edema: Trace  Mental Status: Normal mood and affect. Normal behavior. Normal judgment and thought content.   Assessment and Plan:  Pregnancy: G3P2002 at [redacted]w[redacted]d 1. Supervision of other normal pregnancy, antepartum - [place] MFM OB DETAIL +14 WK; Future - Encouraged hydration and balance between rest and moderate activity. Discussed option to elevate legs as is comfortable  at this point in pregnancy. Precautions provided for worsening symptoms - aspirin EC 81 MG tablet; Take 1 tablet (81 mg total) by mouth daily.  Dispense: 60 tablet; Refill: 2  2. [redacted] weeks gestation of pregnancy - Anatomy scan requested  3. Hypothyroidism affecting pregnancy, antepartum - on synthroid, followed by ENDO - [place] MFM OB DETAIL +14 WK; Future  4. Obesity affecting pregnancy in second trimester, unspecified obesity type - [place] MFM OB DETAIL +14 WK; Future - aspirin EC 81 MG tablet; Take 1 tablet (81 mg total) by mouth daily.  Dispense: 60 tablet; Refill: 2  5. Benign essential HTN, chronic, antepartum - remote history of essential HTN. Bps have been stable without medication. Started patient on bASA at today's appointment. S/S of elevated BP reviewed and when to seek medical attention.   Preterm labor symptoms and general obstetric precautions including but not limited to vaginal bleeding, contractions, leaking of fluid and fetal movement were reviewed in detail with the patient. Please refer to After Visit Summary for other counseling recommendations.   Return in about 4 weeks (around 11/12/2022) for LOB, IN-PERSON.  Future Appointments  Date Time Provider [place]  11/12/2022 11:50 AM Shamleffer, Melanie Crazier, MD [facility] None  11/19/2022  1:30 PM [facility] NURSE [facility]  11/19/2022  1:45 PM [facility] US5 [facility]  11/19/2022  3:30 PM Shelly Bombard, MD [place] None    Johnston Ebbs, NP

## 2022-10-15 NOTE — Progress Notes (Signed)
Pt presents for ROB visit. Pt c/o swelling in hands and feet.

## 2022-10-20 ENCOUNTER — Telehealth: Payer: Self-pay | Admitting: *Deleted

## 2022-10-20 NOTE — Telephone Encounter (Signed)
TC from pt asking if RX for BP was sent after last visit. I reviewed the providers notes. RX baby ASA sent for pre E prevention. Pt's BP stable despite hx of essential HTN. Provider recommendations reviewed with pt. Pt will pick up ASA today.

## 2022-11-12 ENCOUNTER — Encounter: Payer: Self-pay | Admitting: Internal Medicine

## 2022-11-12 ENCOUNTER — Ambulatory Visit (INDEPENDENT_AMBULATORY_CARE_PROVIDER_SITE_OTHER): Payer: BC Managed Care – PPO | Admitting: Internal Medicine

## 2022-11-12 VITALS — BP 126/78 | HR 80 | Ht 61.0 in | Wt 170.0 lb

## 2022-11-12 DIAGNOSIS — Z3482 Encounter for supervision of other normal pregnancy, second trimester: Secondary | ICD-10-CM | POA: Diagnosis not present

## 2022-11-12 DIAGNOSIS — E890 Postprocedural hypothyroidism: Secondary | ICD-10-CM | POA: Diagnosis not present

## 2022-11-12 DIAGNOSIS — E0500 Thyrotoxicosis with diffuse goiter without thyrotoxic crisis or storm: Secondary | ICD-10-CM

## 2022-11-12 LAB — TSH: TSH: 1.85 u[IU]/mL (ref 0.35–5.50)

## 2022-11-12 NOTE — Progress Notes (Signed)
Name: Nicole Keller  MRN/ DOB: [id number], 12-07-1990    Age/ Sex: 32 y.o., female     PCP: Grayce Sessions, NP   Reason for Endocrinology Evaluation: Graves' Disease/postablative hypothyroidism     Initial Endocrinology Clinic Visit: 03/30/2018    PATIENT IDENTIFIER: Nicole Keller is a 32 y.o., female with a past medical history of Graves' disease. Nicole Keller has followed with [facility] since 03/30/2018 for consultative assistance with management of her Luiz Blare' disease.   HISTORICAL SUMMARY: The patient was first diagnosed with hyperthyroidism secondary to Graves' disease in 2018.  Nicole Keller was on methimazole between 2018-2019.   Nicole Keller is s/p RAI with 17.4 mCi I-131 sodium iodide orally on 05/29/2018.  Nicole Keller was started on LT-for replacement shortly after.   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    Nicole Keller is currently at 19 weeks of gestation, this is her third pregnancy Nicole Keller has an 34 yo and 34 yo boys  EDD 04/08/2023- girl   [facility]  Denies constipation or diarrhea  Has occasional  palpitations  Denies local neck swelling  Denies tremors   Levothyroxine 137 mcg daily , 2 tabs Sundays and Wednesdays, 1 tab rest of the week  HISTORY:  Past Medical History:  Past Medical History:  Diagnosis Date   Depression    Hypertension    Hyperthyroidism    Hypothyroid    Past Surgical History: No past surgical history on file. Social History:  reports that Nicole Keller has never smoked. Nicole Keller has never used smokeless tobacco. Nicole Keller reports that Nicole Keller does not currently use alcohol. Nicole Keller reports that Nicole Keller does not currently use drugs. Family History:  Family History  Problem Relation Age of Onset   Hypertension Mother    Thyroid disease Mother    Hyperlipidemia Father    Thyroid disease Sister    Hypertension Maternal Grandmother    Diabetes Paternal Grandmother       HOME MEDICATIONS: Allergies as of 11/12/2022   No Known Allergies      Medication List        Accurate as of Nov 12, 2022 11:48 AM. If you have any questions, ask your nurse or doctor.          aspirin EC 81 MG tablet Take 1 tablet (81 mg total) by mouth daily.   Doxylamine-Pyridoxine 10-10 MG Tbec Commonly known as: Diclegis 1 tab in AM, 1 tab mid afternoon 2 tabs at bedtime. Max dose 4 tabs daily.   levothyroxine 137 MCG tablet Commonly known as: SYNTHROID Take 1 tablet (137 mcg total) by mouth as directed. 2 tabs on Sundays and Wednesdays and 1 tab rest of the week   multivitamin-prenatal 27-0.8 MG Tabs tablet Take 1 tablet by mouth daily at 12 noon.   promethazine 25 MG tablet Commonly known as: PHENERGAN Take 1 tablet (25 mg total) by mouth every 6 (six) hours as needed for nausea or vomiting.          OBJECTIVE:   PHYSICAL EXAM: VS: BP 126/78 (BP Location: Left Arm, Patient Position: Sitting, Cuff Size: Small)   Pulse 80   Ht 5\' 1"  (1.549 m)   Wt 170 lb (77.1 kg)   LMP 07/09/2022 (Exact Date)   SpO2 97%   BMI 32.12 kg/m    EXAM: General: Pt appears well and is in NAD  Eyes: External eye exam normal without stare,  lid lag or exophthalmos.  EOM intact.    Neck: General: Supple without adenopathy. Thyroid:  No goiter or nodules appreciated.  Lungs: Clear with good BS bilat with no rales, rhonchi, or wheezes  Heart: Auscultation: RRR.  Abdomen:  soft, nontender  Extremities:  BL LE: No pretibial edema   Mental Status: Judgment, insight: Intact Orientation: Oriented to time, place, and person Mood and affect: No depression, anxiety, or agitation     DATA REVIEWED:  Latest Reference Range & Units 11/12/22 12:06  TSH 0.35 - 5.50 uIU/mL 1.85    ASSESSMENT / PLAN / RECOMMENDATIONS:   Postablative Hypothyroidism   -Nicole Keller is clinically euthroid  -TSH within normal range for second trimester, no changes at this time -Will repeat labs in 2  months   Medications   Continue levothyroxine 137 mcg , 2 tabs on Sundays and Wednesdays, 1 tablet the rest of the week    2. Graves' Disease:  - No extrathyroidal manifestations of Graves' Disease -Will check TRAb on next lab  F/U in 6 months  Labs in 8 weeks     Signed electronically by: Lyndle Herrlich, MD  [facility]  [facility] [address] [place], [place] [address] Phone: [phone] FAX: [phone]      CC: Grayce Sessions, NP [address] [place] [address] Phone: [phone]  Fax: [phone]   Return to Endocrinology clinic as below: Future Appointments  Date Time Provider Department Center  11/12/2022 11:50 AM Danisha Brassfield, Konrad Dolores, MD [facility] None  11/19/2022  1:30 PM [facility] NURSE [facility]  11/19/2022  1:45 PM [facility] US5 [facility]  11/19/2022  3:30 PM Brock Bad, MD [facility] None

## 2022-11-12 NOTE — Patient Instructions (Signed)

## 2022-11-18 ENCOUNTER — Encounter: Payer: Self-pay | Admitting: *Deleted

## 2022-11-19 ENCOUNTER — Ambulatory Visit: Payer: BC Managed Care – PPO | Admitting: *Deleted

## 2022-11-19 ENCOUNTER — Other Ambulatory Visit: Payer: Self-pay | Admitting: *Deleted

## 2022-11-19 ENCOUNTER — Encounter: Payer: Self-pay | Admitting: *Deleted

## 2022-11-19 ENCOUNTER — Encounter: Payer: Self-pay | Admitting: Obstetrics

## 2022-11-19 ENCOUNTER — Ambulatory Visit (INDEPENDENT_AMBULATORY_CARE_PROVIDER_SITE_OTHER): Payer: BC Managed Care – PPO | Admitting: Obstetrics

## 2022-11-19 ENCOUNTER — Ambulatory Visit: Payer: BC Managed Care – PPO | Attending: Student

## 2022-11-19 VITALS — BP 117/76 | HR 75 | Wt 169.6 lb

## 2022-11-19 VITALS — BP 127/70 | HR 77

## 2022-11-19 DIAGNOSIS — O99412 Diseases of the circulatory system complicating pregnancy, second trimester: Secondary | ICD-10-CM | POA: Diagnosis not present

## 2022-11-19 DIAGNOSIS — O99212 Obesity complicating pregnancy, second trimester: Secondary | ICD-10-CM

## 2022-11-19 DIAGNOSIS — E079 Disorder of thyroid, unspecified: Secondary | ICD-10-CM | POA: Diagnosis not present

## 2022-11-19 DIAGNOSIS — Z3A19 19 weeks gestation of pregnancy: Secondary | ICD-10-CM | POA: Insufficient documentation

## 2022-11-19 DIAGNOSIS — O99282 Endocrine, nutritional and metabolic diseases complicating pregnancy, second trimester: Secondary | ICD-10-CM | POA: Insufficient documentation

## 2022-11-19 DIAGNOSIS — E0500 Thyrotoxicosis with diffuse goiter without thyrotoxic crisis or storm: Secondary | ICD-10-CM

## 2022-11-19 DIAGNOSIS — O10012 Pre-existing essential hypertension complicating pregnancy, second trimester: Secondary | ICD-10-CM | POA: Diagnosis not present

## 2022-11-19 DIAGNOSIS — O10912 Unspecified pre-existing hypertension complicating pregnancy, second trimester: Secondary | ICD-10-CM

## 2022-11-19 DIAGNOSIS — O99891 Other specified diseases and conditions complicating pregnancy: Secondary | ICD-10-CM | POA: Diagnosis not present

## 2022-11-19 DIAGNOSIS — Z8639 Personal history of other endocrine, nutritional and metabolic disease: Secondary | ICD-10-CM

## 2022-11-19 DIAGNOSIS — Z3480 Encounter for supervision of other normal pregnancy, unspecified trimester: Secondary | ICD-10-CM | POA: Insufficient documentation

## 2022-11-19 DIAGNOSIS — R000 Tachycardia, unspecified: Secondary | ICD-10-CM

## 2022-11-19 DIAGNOSIS — Z363 Encounter for antenatal screening for malformations: Secondary | ICD-10-CM | POA: Insufficient documentation

## 2022-11-19 DIAGNOSIS — O0990 Supervision of high risk pregnancy, unspecified, unspecified trimester: Secondary | ICD-10-CM | POA: Diagnosis not present

## 2022-11-19 DIAGNOSIS — E039 Hypothyroidism, unspecified: Secondary | ICD-10-CM | POA: Diagnosis not present

## 2022-11-19 DIAGNOSIS — O99280 Endocrine, nutritional and metabolic diseases complicating pregnancy, unspecified trimester: Secondary | ICD-10-CM | POA: Insufficient documentation

## 2022-11-19 DIAGNOSIS — E669 Obesity, unspecified: Secondary | ICD-10-CM

## 2022-11-19 NOTE — Progress Notes (Signed)
Pt reports fetal movement, denies pain. Reports tingling and swelling in hands mostly in the morning.

## 2022-11-19 NOTE — Progress Notes (Signed)
Subjective:  Paul Mogul is a 32 y.o. G3P2002 at [redacted]w[redacted]d being seen today for ongoing prenatal care.  She is currently monitored for the following issues for this high-risk pregnancy and has Class 1 obesity with serious comorbidity and body mass index (BMI) of 30.0 to 30.9 in adult; Essential hypertension; Graves disease; Supervision of other normal pregnancy, antepartum; and Rubella non-immune status, antepartum on their problem list.  Patient reports heartburn.  Contractions: Not present. Vag. Bleeding: None.  Movement: Present. Denies leaking of fluid.   The following portions of the patient's history were reviewed and updated as appropriate: allergies, current medications, past family history, past medical history, past social history, past surgical history and problem list. Problem list updated.  Objective:   Vitals:   11/19/22 1534  BP: 117/76  Pulse: 75  Weight: 169 lb 9.6 oz (76.9 kg)    Fetal Status: Fetal Heart Rate (bpm): 148   Movement: Present     General:  Alert, oriented and cooperative. Patient is in no acute distress.  Skin: Skin is warm and dry. No rash noted.   Cardiovascular: Normal heart rate noted  Respiratory: Normal respiratory effort, no problems with respiration noted  Abdomen: Soft, gravid, appropriate for gestational age. Pain/Pressure: Absent     Pelvic:  Cervical exam deferred        Extremities: Normal range of motion.  Edema: Trace  Mental Status: Normal mood and affect. Normal behavior. Normal judgment and thought content.   Urinalysis:      Assessment and Plan:  Pregnancy: G3P2002 at [redacted]w[redacted]d  1. Supervision of high risk pregnancy, antepartum Rx: - AFP, Serum, Open Spina Bifida  2. Hypothyroidism affecting pregnancy, antepartum - clinically stable  3. Obesity affecting pregnancy in second trimester, unspecified obesity type   Preterm labor symptoms and general obstetric precautions including but not limited to vaginal bleeding, contractions,  leaking of fluid and fetal movement were reviewed in detail with the patient. Please refer to After Visit Summary for other counseling recommendations.   Return in about 4 weeks (around 12/17/2022) for [facility].   Brock Bad, MD 11/19/22

## 2022-11-21 LAB — AFP, SERUM, OPEN SPINA BIFIDA
AFP MoM: 1.56
AFP Value: 73.5 ng/mL
Gest. Age on Collection Date: 19 weeks
Maternal Age At EDD: 33.9 yr
OSBR Risk 1 IN: 2331
Test Results:: NEGATIVE
Weight: 169 [lb_av]

## 2022-12-04 ENCOUNTER — Other Ambulatory Visit: Payer: Self-pay

## 2022-12-04 ENCOUNTER — Emergency Department (HOSPITAL_COMMUNITY)
Admission: EM | Admit: 2022-12-04 | Discharge: 2022-12-05 | Disposition: A | Discharge status: Home / Self Care | Payer: BC Managed Care – PPO | Attending: Emergency Medicine | Admitting: Emergency Medicine

## 2022-12-04 ENCOUNTER — Encounter (HOSPITAL_COMMUNITY): Payer: Self-pay | Admitting: Emergency Medicine

## 2022-12-04 DIAGNOSIS — O99512 Diseases of the respiratory system complicating pregnancy, second trimester: Secondary | ICD-10-CM | POA: Diagnosis not present

## 2022-12-04 DIAGNOSIS — Z1152 Encounter for screening for COVID-19: Secondary | ICD-10-CM | POA: Insufficient documentation

## 2022-12-04 DIAGNOSIS — J069 Acute upper respiratory infection, unspecified: Secondary | ICD-10-CM | POA: Diagnosis not present

## 2022-12-04 DIAGNOSIS — O26892 Other specified pregnancy related conditions, second trimester: Secondary | ICD-10-CM | POA: Insufficient documentation

## 2022-12-04 DIAGNOSIS — B9789 Other viral agents as the cause of diseases classified elsewhere: Secondary | ICD-10-CM | POA: Diagnosis not present

## 2022-12-04 DIAGNOSIS — R059 Cough, unspecified: Secondary | ICD-10-CM | POA: Diagnosis not present

## 2022-12-04 DIAGNOSIS — Z7982 Long term (current) use of aspirin: Secondary | ICD-10-CM | POA: Insufficient documentation

## 2022-12-04 LAB — GROUP A STREP BY PCR: Group A Strep by PCR: NOT DETECTED

## 2022-12-04 NOTE — ED Triage Notes (Signed)
Pt c/o cough, suspected fever, runny nose, body aches, sore throat, and headaches throughout the day. No CP/SOB.   Per note pt is currently 5 months pregnant.

## 2022-12-04 NOTE — ED Triage Notes (Signed)
FHT 155

## 2022-12-05 LAB — RESP PANEL BY RT-PCR (RSV, FLU A&B, COVID)  RVPGX2
Influenza A by PCR: NEGATIVE
Influenza B by PCR: NEGATIVE
Resp Syncytial Virus by PCR: NEGATIVE
SARS Coronavirus 2 by RT PCR: NEGATIVE

## 2022-12-05 NOTE — ED Provider Notes (Signed)
[place] EMERGENCY DEPARTMENT AT [facility] Provider Note   CSN: [id number] Arrival date & time: 12/04/22  2251     History  Chief Complaint  Patient presents with   Cough    Nicole Keller is a 32 y.o. female.  HPI   Patient with medical history including 5 months pregnant presenting with complaints of URI-like symptoms.  Started yesterday, endorses fevers chills general body aches nasal congestion as well as a productive cough.  She denies any chest pain or shortness of breath denies any pleuritic chest pain, no history of PEs or DVTs no recent surgeries no long immobilization denies any leg swelling or calf tenderness.  She denies any recent sick contacts, she denies any stomach pains, no vaginal discharge no vaginal bleeding, states that she has not had any complications thus far with her current pregnancy.    Home Medications Prior to Admission medications   Medication Sig Start Date End Date Taking? Authorizing Provider  aspirin EC 81 MG tablet Take 1 tablet (81 mg total) by mouth daily. 10/15/22   Corlis Hove, NP  Doxylamine-Pyridoxine (DICLEGIS) 10-10 MG TBEC 1 tab in AM, 1 tab mid afternoon 2 tabs at bedtime. Max dose 4 tabs daily. Patient not taking: Reported on 10/15/2022 09/17/22   Brock Bad, MD  levothyroxine (SYNTHROID) 137 MCG tablet Take 1 tablet (137 mcg total) by mouth as directed. 2 tabs on Sundays and Wednesdays and 1 tab rest of the week 08/14/22   Shamleffer, Konrad Dolores, MD  Prenatal Vit-Fe Fumarate-FA (MULTIVITAMIN-PRENATAL) 27-0.8 MG TABS tablet Take 1 tablet by mouth daily at 12 noon.    [provider]      Allergies    Patient has no known allergies.    Review of Systems   Review of Systems  Constitutional:  Positive for chills and fever.  Respiratory:  Positive for cough. Negative for shortness of breath.   Cardiovascular:  Negative for chest pain.  Gastrointestinal:  Negative for abdominal pain.  Neurological:   Negative for headaches.    Physical Exam Updated Vital Signs BP 123/81   Pulse (!) 102   Temp 98.3 F (36.8 C) (Oral)   Resp 18   Ht 5\' 1"  (1.549 m)   Wt 77.1 kg   LMP 07/09/2022 (Exact Date)   SpO2 100%   BMI 32.12 kg/m  Physical Exam Vitals and nursing note reviewed.  Constitutional:      General: She is not in acute distress.    Appearance: She is not ill-appearing.  HENT:     Head: Normocephalic and atraumatic.     Nose: No congestion.     Mouth/Throat:     Mouth: Mucous membranes are moist.     Pharynx: Oropharynx is clear. No oropharyngeal exudate or posterior oropharyngeal erythema.     Comments: No trismus no torticollis, tongue uvula both midline controlling oral secretions tonsils both equal symmetric bilaterally no submandibular swelling no muffled tone voice. Eyes:     Conjunctiva/sclera: Conjunctivae normal.  Cardiovascular:     Rate and Rhythm: Normal rate and regular rhythm.     Pulses: Normal pulses.     Heart sounds: No murmur heard.    No friction rub. No gallop.  Pulmonary:     Effort: No respiratory distress.     Breath sounds: No wheezing, rhonchi or rales.  Abdominal:     General: There is distension.     Palpations: Abdomen is soft.     Tenderness: There is  no abdominal tenderness. There is no right CVA tenderness or left CVA tenderness.     Comments: Abdomen is appropriately distended for 20 weeks present, soft nontender.  Musculoskeletal:     Right lower leg: No edema.     Left lower leg: No edema.     Comments: No unilateral leg swelling no calf tenderness no palpable cords.  Skin:    General: Skin is warm and dry.  Neurological:     Mental Status: She is alert.  Psychiatric:        Mood and Affect: Mood normal.     ED Results / Procedures / Treatments   Labs (all labs ordered are listed, but only abnormal results are displayed) Labs Reviewed  GROUP A STREP BY PCR  RESP PANEL BY RT-PCR (RSV, FLU A&B, COVID)  [id number]     EKG None  Radiology No results found.  Procedures Procedures    Medications Ordered in ED Medications - No data to display  ED Course/ Medical Decision Making/ A&P                             Medical Decision Making  This patient presents to the ED for concern of URI, this involves an extensive number of treatment options, and is a complaint that carries with it a high risk of complications and morbidity.  The differential diagnosis includes pneumonia, PE, viral URI    Additional history obtained:  Additional history obtained from N/A External records from outside source obtained and reviewed including OB/GYN notes   Co morbidities that complicate the patient evaluation  [redacted] weeks pregnant  Social Determinants of Health:  N/A    Lab Tests:  I Ordered, and personally interpreted labs.  The pertinent results include: Respiratory panel negative, strep test negative   Imaging Studies ordered:  I ordered imaging studies including N/A I independently visualized and interpreted imaging which showed N/A I agree with the radiologist interpretation   Cardiac Monitoring:  The patient was maintained on a cardiac monitor.  I personally viewed and interpreted the cardiac monitored which showed an underlying rhythm of: N/A   Medicines ordered and prescription drug management:  I ordered medication including N/A I have reviewed the patients home medicines and have made adjustments as needed  Critical Interventions:  N/A   Reevaluation:  Presents with URI, triage obtained lab workup which I personally reviewed unremarkable,  benign physical exam agreement with discharge at this time  Consultations Obtained:  N/a    Test Considered:  Evaluation by OB but this will be deferred patient not having any pregnancy related issues at this time i.e. no abdominal pain no vaginal bleeding no vaginal discharge.    Rule out Low suspicion for systemic infection  as patient is nontoxic-appearing, vital signs reassuring, no obvious source infection noted on exam.  Low suspicion for pneumonia as lung sounds are clear bilaterally.  I have low suspicion for PE as patient denies pleuritic chest pain, shortness of breath vital signs reassuring nontachypneic nonhypoxic, presentation is most consistent with viral URI. low suspicion for strep throat as oropharynx was visualized, no erythema or exudates noted.  Low suspicion patient would need  hospitalized due to viral infection or Covid as vital signs reassuring, patient is not in respiratory distress.      Dispostion and problem list  After consideration of the diagnostic results and the patients response to treatment, I feel that the patent would benefit  from discharge.  Cough congestion-likely viral URI will recommend symptom management follow-up PCP as needed strict return precautions.            Final Clinical Impression(s) / ED Diagnoses Final diagnoses:  Viral URI with cough    Rx / DC Orders ED Discharge Orders     None         Carroll Sage, PA-C 12/05/22 0503    Mesner, Barbara Cower, MD 12/05/22 [phone]

## 2022-12-05 NOTE — Discharge Instructions (Signed)
Likely a viral infection, recommend over-the-counter pain medications like ibuprofen Tylenol for fever and pain control, nasal decongestions like Flonase and Zyrtec.  If not eating recommend supplementing with Gatorade to help with electrolyte supplementation.  Follow-up PCP for further evaluation.  Come back to the emergency department if you develop chest pain, shortness of breath, severe abdominal pain, uncontrolled nausea, vomiting, diarrhea.  

## 2022-12-05 NOTE — ED Notes (Signed)
Pt is sitting in H15 as a visitor, stated to this tech she is only waiting on test results.

## 2022-12-10 ENCOUNTER — Ambulatory Visit (INDEPENDENT_AMBULATORY_CARE_PROVIDER_SITE_OTHER): Payer: Self-pay | Admitting: *Deleted

## 2022-12-10 NOTE — Telephone Encounter (Signed)
Reason for Disposition  MILD-MODERATE diarrhea (e.g., 1-6 times / day more than normal)    Due to being [redacted] weeks pregnant she is going to call her OB dr to see what is safe to take for diarrhea.  Answer Assessment - Initial Assessment Questions 1. DIARRHEA SEVERITY: "How bad is the diarrhea?" "How many more stools have you had in the past 24 hours than normal?"    - NO DIARRHEA (SCALE 0)   - MILD (SCALE 1-3): Few loose or mushy BMs; increase of 1-3 stools over normal daily number of stools; mild increase in ostomy output.   -  MODERATE (SCALE 4-7): Increase of 4-6 stools daily over normal; moderate increase in ostomy output.   -  SEVERE (SCALE 8-10; OR "WORST POSSIBLE"): Increase of 7 or more stools daily over normal; moderate increase in ostomy output; incontinence.   I was sick all last week.   I had body aches, sore throat, coughing ,runny nose.   Friday night I went to ED be sure I didn't have Covid or Strep.   All negative tests.    I had a virus. 2. ONSET: "When did the diarrhea begin?"      Yesterday afternoon diarrhea started.   I'm pregnant [redacted] weeks today.   I called my OB dr they told me to take OTC medicines for the congestion and cold symptoms.    The cold is doing better, I feel better but still having some nasal congestion.     I'm using Vicks on my back and chest.   My boyfriend rubs the Vicks on my back and chest which is helping.     I'm usually constipated but I'm having diarrhea now.    Every time I eat I have diarrhea.   No fever.   I cramp right before I have diarrhea then it goes away once I go to the bathroom. 3. BM CONSISTENCY: "How loose or watery is the diarrhea?"      Watery    No blood 4. VOMITING: "Are you also vomiting?" If Yes, ask: "How many times in the past 24 hours?"      No vomiting 5. ABDOMEN PAIN: "Are you having any abdomen pain?" If Yes, ask: "What does it feel like?" (e.g., crampy, dull, intermittent, constant)      Cramping before I have diarrhea. 6.  ABDOMEN PAIN SEVERITY: If present, ask: "How bad is the pain?"  (e.g., Scale 1-10; mild, moderate, or severe)   - MILD (1-3): doesn't interfere with normal activities, abdomen soft and not tender to touch    - MODERATE (4-7): interferes with normal activities or awakens from sleep, abdomen tender to touch    - SEVERE (8-10): excruciating pain, doubled over, unable to do any normal activities       Some cramping before I have diarrhea then it goes away once I go to the bathroom. 7. ORAL INTAKE: If vomiting, "Have you been able to drink liquids?" "How much liquids have you had in the past 24 hours?"     The only thing I can drink is water.     I've been drinking Pedialyte since yesterday.       8. HYDRATION: "Any signs of dehydration?" (e.g., dry mouth [not just dry lips], too weak to stand, dizziness, new weight loss) "When did you last urinate?"     My mouth is dry.   On Fri. When I went to ED they didn't weigh me.  I feel a little  lightheaded at times.   It happens once a day.    I'm drinking a lot of water.    I urinate a lot.   I'm thirsty all the time.   I asked her if she had been tested for gestational diabetes and she said they had not tested her for that yet.   She has an appt with her OB dr this coming week.  When I asked her about family history of diabetes her grandma has diabetes.   Pt is going to call her OB dr and ask her about what she can take for diarrhea since she is pregnant and also about being tested for gestational diabetes due to her symptoms.   "I'm going to call them now" and thanked me very much for calling her back.    9. EXPOSURE: "Have you traveled to a foreign country recently?" "Have you been exposed to anyone with diarrhea?" "Could you have eaten any food that was spoiled?"     Not that she knows of 10. ANTIBIOTIC USE: "Are you taking antibiotics now or have you taken antibiotics in the past 2 months?"       Not asked 11. OTHER SYMPTOMS: "Do you have any other  symptoms?" (e.g., fever, blood in stool)       No blood in stool.  No fever.    Is feeling a little better from being sick last week with upper respiratory symptoms. 12. PREGNANCY: "Is there any chance you are pregnant?" "When was your last menstrual period?"       Yes   She is [redacted] weeks pregnant today.  Protocols used: [facility]

## 2022-12-10 NOTE — Telephone Encounter (Signed)
  Chief Complaint: Having diarrhea.   She is [redacted] weeks pregnant.   Seen in ED 12/04/2022 for URI.  Covid and Strep tests negative. Symptoms: Diarrhea started yesterday.  Having abd cramping right before having diarrhea that goes away after she goes to the bathroom.    Still having some nasal congestion but overall feels better.   Frequency: Having diarrhea after every time she eats that started yesterday Pertinent Negatives: Patient denies blood in stool or fever. Disposition: [] ED /[] Urgent Care (no appt availability in office) / [] Appointment(In office/virtual)/ []  [facility]/ [x] Home Care/ [] Refused Recommended Disposition /[] [facility]/ []  Follow-up with PCP Additional Notes: Pt is calling her OB dr now to see what is safe to take for diarrhea since she is pregnant.   She's also going to ask about testing for gestational diabetes.

## 2022-12-17 ENCOUNTER — Ambulatory Visit (INDEPENDENT_AMBULATORY_CARE_PROVIDER_SITE_OTHER): Payer: BC Managed Care – PPO | Admitting: Obstetrics and Gynecology

## 2022-12-17 VITALS — BP 107/70 | HR 81 | Wt 176.0 lb

## 2022-12-17 DIAGNOSIS — O99280 Endocrine, nutritional and metabolic diseases complicating pregnancy, unspecified trimester: Secondary | ICD-10-CM

## 2022-12-17 DIAGNOSIS — O09899 Supervision of other high risk pregnancies, unspecified trimester: Secondary | ICD-10-CM

## 2022-12-17 DIAGNOSIS — Z3480 Encounter for supervision of other normal pregnancy, unspecified trimester: Secondary | ICD-10-CM

## 2022-12-17 DIAGNOSIS — E039 Hypothyroidism, unspecified: Secondary | ICD-10-CM

## 2022-12-17 DIAGNOSIS — Z3A23 23 weeks gestation of pregnancy: Secondary | ICD-10-CM

## 2022-12-17 DIAGNOSIS — Z2839 Encounter for supervision of normal pregnancy, unspecified, unspecified trimester: Secondary | ICD-10-CM

## 2022-12-17 DIAGNOSIS — I10 Essential (primary) hypertension: Secondary | ICD-10-CM

## 2022-12-17 NOTE — Progress Notes (Signed)
ROB c/o dry mouth

## 2022-12-17 NOTE — Progress Notes (Signed)
   PRENATAL VISIT NOTE  Subjective:  Nicole Keller is a 32 y.o. G3P2002 at [redacted]w[redacted]d being seen today for ongoing prenatal care.  She is currently monitored for the following issues for this high-risk pregnancy and has Class 1 obesity with serious comorbidity and body mass index (BMI) of 30.0 to 30.9 in adult; Essential hypertension; Hypothyroid in pregnancy, antepartum; Graves disease; Supervision of other normal pregnancy, antepartum; and Rubella non-immune status, antepartum on their problem list.  Patient doing well with no acute concerns today. She reports no complaints.  Contractions: Not present. Vag. Bleeding: None.  Movement: Present. Denies leaking of fluid.   The following portions of the patient's history were reviewed and updated as appropriate: allergies, current medications, past family history, past medical history, past social history, past surgical history and problem list. Problem list updated.  Objective:   Vitals:   12/17/22 1556  BP: 107/70  Pulse: 81  Weight: 176 lb (79.8 kg)    Fetal Status: Fetal Heart Rate (bpm): 150 Fundal Height: 25 cm Movement: Present     General:  Alert, oriented and cooperative. Patient is in no acute distress.  Skin: Skin is warm and dry. No rash noted.   Cardiovascular: Normal heart rate noted  Respiratory: Normal respiratory effort, no problems with respiration noted  Abdomen: Soft, gravid, appropriate for gestational age.  Pain/Pressure: Absent     Pelvic: Cervical exam deferred        Extremities: Normal range of motion.  Edema: None  Mental Status:  Normal mood and affect. Normal behavior. Normal judgment and thought content.   Assessment and Plan:  Pregnancy: G3P2002 at [redacted]w[redacted]d  1. [redacted] weeks gestation of pregnancy   2. Supervision of other normal pregnancy, antepartum Continue routine prenatal care  3. Rubella non-immune status, antepartum Treat after delivery  4. Essential hypertension Pt takes baby ASA  5. Hypothyroid in  pregnancy, antepartum Last TSH was WNL, will recheck with 26-28 week labs. Continue synthroid  Preterm labor symptoms and general obstetric precautions including but not limited to vaginal bleeding, contractions, leaking of fluid and fetal movement were reviewed in detail with the patient.  Please refer to After Visit Summary for other counseling recommendations.   Return in about 4 weeks (around 01/14/2023) for ROB, in person, 2 hr GTT, 3rd trim labs.   Mariel Aloe, MD Faculty Attending Center for [facility]

## 2022-12-24 ENCOUNTER — Other Ambulatory Visit: Payer: Self-pay | Admitting: *Deleted

## 2022-12-24 ENCOUNTER — Ambulatory Visit: Payer: BC Managed Care – PPO | Admitting: *Deleted

## 2022-12-24 ENCOUNTER — Ambulatory Visit: Payer: BC Managed Care – PPO | Attending: Obstetrics

## 2022-12-24 VITALS — BP 137/67 | HR 87

## 2022-12-24 DIAGNOSIS — O3662X0 Maternal care for excessive fetal growth, second trimester, not applicable or unspecified: Secondary | ICD-10-CM | POA: Diagnosis not present

## 2022-12-24 DIAGNOSIS — O99280 Endocrine, nutritional and metabolic diseases complicating pregnancy, unspecified trimester: Secondary | ICD-10-CM | POA: Diagnosis not present

## 2022-12-24 DIAGNOSIS — O10012 Pre-existing essential hypertension complicating pregnancy, second trimester: Secondary | ICD-10-CM | POA: Diagnosis not present

## 2022-12-24 DIAGNOSIS — R000 Tachycardia, unspecified: Secondary | ICD-10-CM | POA: Diagnosis not present

## 2022-12-24 DIAGNOSIS — Z3A24 24 weeks gestation of pregnancy: Secondary | ICD-10-CM

## 2022-12-24 DIAGNOSIS — O99282 Endocrine, nutritional and metabolic diseases complicating pregnancy, second trimester: Secondary | ICD-10-CM | POA: Diagnosis not present

## 2022-12-24 DIAGNOSIS — O10912 Unspecified pre-existing hypertension complicating pregnancy, second trimester: Secondary | ICD-10-CM | POA: Insufficient documentation

## 2022-12-24 DIAGNOSIS — Z3689 Encounter for other specified antenatal screening: Secondary | ICD-10-CM

## 2022-12-24 DIAGNOSIS — O99212 Obesity complicating pregnancy, second trimester: Secondary | ICD-10-CM | POA: Diagnosis not present

## 2022-12-24 DIAGNOSIS — Z923 Personal history of irradiation: Secondary | ICD-10-CM | POA: Diagnosis not present

## 2022-12-24 DIAGNOSIS — E669 Obesity, unspecified: Secondary | ICD-10-CM

## 2022-12-24 DIAGNOSIS — E079 Disorder of thyroid, unspecified: Secondary | ICD-10-CM | POA: Diagnosis not present

## 2022-12-24 DIAGNOSIS — O3660X0 Maternal care for excessive fetal growth, unspecified trimester, not applicable or unspecified: Secondary | ICD-10-CM | POA: Insufficient documentation

## 2022-12-24 DIAGNOSIS — E039 Hypothyroidism, unspecified: Secondary | ICD-10-CM

## 2023-01-12 ENCOUNTER — Other Ambulatory Visit (INDEPENDENT_AMBULATORY_CARE_PROVIDER_SITE_OTHER): Payer: BC Managed Care – PPO

## 2023-01-12 DIAGNOSIS — E0500 Thyrotoxicosis with diffuse goiter without thyrotoxic crisis or storm: Secondary | ICD-10-CM

## 2023-01-12 DIAGNOSIS — E890 Postprocedural hypothyroidism: Secondary | ICD-10-CM

## 2023-01-12 LAB — TSH: TSH: 2.73 u[IU]/mL (ref 0.35–5.50)

## 2023-01-15 LAB — TRAB (TSH RECEPTOR BINDING ANTIBODY): TRAB: <1 IU/L (ref <=2.00)

## 2023-01-18 ENCOUNTER — Ambulatory Visit (INDEPENDENT_AMBULATORY_CARE_PROVIDER_SITE_OTHER): Payer: BC Managed Care – PPO | Admitting: Obstetrics and Gynecology

## 2023-01-18 ENCOUNTER — Other Ambulatory Visit: Payer: BC Managed Care – PPO

## 2023-01-18 VITALS — BP 102/71 | HR 79 | Wt 179.0 lb

## 2023-01-18 DIAGNOSIS — E890 Postprocedural hypothyroidism: Secondary | ICD-10-CM

## 2023-01-18 DIAGNOSIS — O09899 Supervision of other high risk pregnancies, unspecified trimester: Secondary | ICD-10-CM

## 2023-01-18 DIAGNOSIS — O3663X0 Maternal care for excessive fetal growth, third trimester, not applicable or unspecified: Secondary | ICD-10-CM

## 2023-01-18 DIAGNOSIS — Z3A28 28 weeks gestation of pregnancy: Secondary | ICD-10-CM | POA: Diagnosis not present

## 2023-01-18 DIAGNOSIS — O99283 Endocrine, nutritional and metabolic diseases complicating pregnancy, third trimester: Secondary | ICD-10-CM | POA: Diagnosis not present

## 2023-01-18 DIAGNOSIS — O09893 Supervision of other high risk pregnancies, third trimester: Secondary | ICD-10-CM

## 2023-01-18 DIAGNOSIS — Z2839 Other underimmunization status: Secondary | ICD-10-CM

## 2023-01-18 DIAGNOSIS — Z3480 Encounter for supervision of other normal pregnancy, unspecified trimester: Secondary | ICD-10-CM

## 2023-01-18 DIAGNOSIS — Z23 Encounter for immunization: Secondary | ICD-10-CM

## 2023-01-18 NOTE — Progress Notes (Signed)
   PRENATAL VISIT NOTE  Subjective:  Nicole Keller is a 32 y.o. G3P2002 at [redacted]w[redacted]d being seen today for ongoing prenatal care.  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  Patient doing well with no acute concerns today. She reports  right arm and bilateral hand swelling .  Contractions: Not present. Vag. Bleeding: None.  Movement: Present. Denies leaking of fluid.   The following portions of the patient's history were reviewed and updated as appropriate: allergies, current medications, past family history, past medical history, past social history, past surgical history and problem list. Problem list updated.  Objective:   Vitals:   01/18/23 0906  BP: 102/71  Pulse: 79  Weight: 179 lb (81.2 kg)    Fetal Status: Fetal Heart Rate (bpm): 151 Fundal Height: 30 cm Movement: Present     General:  Alert, oriented and cooperative. Patient is in no acute distress.  Skin: Skin is warm and dry. No rash noted.   Cardiovascular: Normal heart rate noted  Respiratory: Normal respiratory effort, no problems with respiration noted  Abdomen: Soft, gravid, appropriate for gestational age.  Pain/Pressure: Absent     Pelvic: Cervical exam deferred        Extremities: Normal range of motion.  Edema: Trace  Mental Status:  Normal mood and affect. Normal behavior. Normal judgment and thought content.   Assessment and Plan:  Pregnancy: G3P2002 at [redacted]w[redacted]d  1. Supervision of other normal pregnancy, antepartum Continue routine prenatal care  - Glucose Tolerance, 2 Hours  w/1 Hour - RPR - CBC - HIV antibody (with reflex) - Tdap vaccine greater than or equal to 7yo IM - Thyroid Panel With TSH  2. [redacted] weeks gestation of pregnancy  - Glucose Tolerance, 2 Hours w/1 Hour - RPR - CBC - HIV antibody (with reflex) - Tdap vaccine greater than or equal to 7yo IM  3. 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 Rubella non-immune status, antepartum Treat after delivery  5. Excessive fetal growth affecting management of pregnancy in third trimester, single or unspecified fetus Growth scan 7/18, fundal height does feel somewhat elevated, ie 1-2 weeks ahead  Preterm labor symptoms and general obstetric precautions including but not limited to vaginal bleeding, contractions, leaking of fluid and fetal movement were reviewed in detail with the patient.  Please refer to After Visit Summary for other counseling recommendations.   Return in about 2 weeks (around 02/01/2023) for [facility], in person.   Mariel Aloe, MD Faculty Attending Center for [facility]

## 2023-01-18 NOTE — Progress Notes (Signed)
ROB/GTT/TDAP.  Pt c/o pain 7/10, swelling, numbness and tingling in right arm and fingers x 2 weeks. TDap given in RD, tolerated well.

## 2023-01-19 LAB — CBC
Hematocrit: 38.9 % (ref 34.0–46.6)
Hemoglobin: 12.7 g/dL (ref 11.1–15.9)
MCH: 28.9 pg (ref 26.6–33.0)
MCHC: 32.6 g/dL (ref 31.5–35.7)
MCV: 88 fL (ref 79–97)
Platelets: 299 10*3/uL (ref 150–450)
RBC: 4.4 x10E6/uL (ref 3.77–5.28)
RDW: 12.7 % (ref 11.7–15.4)
WBC: 10.8 10*3/uL (ref 3.4–10.8)

## 2023-01-19 LAB — RPR: RPR Ser Ql: NONREACTIVE

## 2023-01-19 LAB — HIV ANTIBODY (ROUTINE TESTING W REFLEX): HIV Screen 4th Generation wRfx: NONREACTIVE

## 2023-01-19 LAB — THYROID PANEL WITH TSH
Free Thyroxine Index: 1.7 (ref 1.2–4.9)
T3 Uptake Ratio: 16 % — ABNORMAL LOW (ref 24–39)
T4, Total: 10.9 ug/dL (ref 4.5–12.0)
TSH: 1.44 u[IU]/mL (ref 0.450–4.500)

## 2023-01-19 LAB — GLUCOSE TOLERANCE, 2 HOURS W/ 1HR
Glucose, 1 hour: 141 mg/dL (ref 70–179)
Glucose, 2 hour: 87 mg/dL (ref 70–152)
Glucose, Fasting: 74 mg/dL (ref 70–91)

## 2023-01-25 ENCOUNTER — Encounter: Payer: Self-pay | Admitting: Obstetrics and Gynecology

## 2023-01-26 ENCOUNTER — Telehealth: Payer: Self-pay

## 2023-01-26 NOTE — Telephone Encounter (Signed)
I connected with  Nicole Keller on 01/26/23 by telephone and verified that I am speaking with the correct person using two identifiers.  Patient sent My Chart Message (see message) but states she is doing better today.   Patient denies headache, visual changes, or elevated blood pressure. Patient advised to elevate feet, wear compression stockings, and maintain adequate hydration.  Patient voices understanding and has no further questions at this time.

## 2023-01-28 ENCOUNTER — Ambulatory Visit: Payer: BC Managed Care – PPO | Attending: Maternal & Fetal Medicine

## 2023-01-28 ENCOUNTER — Ambulatory Visit: Payer: BC Managed Care – PPO | Admitting: *Deleted

## 2023-01-28 ENCOUNTER — Other Ambulatory Visit: Payer: Self-pay | Admitting: *Deleted

## 2023-01-28 VITALS — BP 117/64 | HR 80

## 2023-01-28 DIAGNOSIS — O321XX0 Maternal care for breech presentation, not applicable or unspecified: Secondary | ICD-10-CM | POA: Diagnosis not present

## 2023-01-28 DIAGNOSIS — O99282 Endocrine, nutritional and metabolic diseases complicating pregnancy, second trimester: Secondary | ICD-10-CM

## 2023-01-28 DIAGNOSIS — Z3A29 29 weeks gestation of pregnancy: Secondary | ICD-10-CM | POA: Insufficient documentation

## 2023-01-28 DIAGNOSIS — O99283 Endocrine, nutritional and metabolic diseases complicating pregnancy, third trimester: Secondary | ICD-10-CM

## 2023-01-28 DIAGNOSIS — E039 Hypothyroidism, unspecified: Secondary | ICD-10-CM

## 2023-01-28 DIAGNOSIS — O3663X0 Maternal care for excessive fetal growth, third trimester, not applicable or unspecified: Secondary | ICD-10-CM | POA: Insufficient documentation

## 2023-01-28 DIAGNOSIS — E669 Obesity, unspecified: Secondary | ICD-10-CM

## 2023-01-28 DIAGNOSIS — Z3689 Encounter for other specified antenatal screening: Secondary | ICD-10-CM | POA: Insufficient documentation

## 2023-01-28 DIAGNOSIS — O99213 Obesity complicating pregnancy, third trimester: Secondary | ICD-10-CM | POA: Insufficient documentation

## 2023-01-28 DIAGNOSIS — Z3480 Encounter for supervision of other normal pregnancy, unspecified trimester: Secondary | ICD-10-CM | POA: Insufficient documentation

## 2023-02-02 ENCOUNTER — Ambulatory Visit: Payer: BC Managed Care – PPO | Admitting: Certified Nurse Midwife

## 2023-02-02 VITALS — BP 107/70 | HR 82 | Wt 179.4 lb

## 2023-02-02 DIAGNOSIS — R200 Anesthesia of skin: Secondary | ICD-10-CM

## 2023-02-02 DIAGNOSIS — R1030 Lower abdominal pain, unspecified: Secondary | ICD-10-CM

## 2023-02-02 DIAGNOSIS — E890 Postprocedural hypothyroidism: Secondary | ICD-10-CM

## 2023-02-02 DIAGNOSIS — Z3A30 30 weeks gestation of pregnancy: Secondary | ICD-10-CM

## 2023-02-02 DIAGNOSIS — O0990 Supervision of high risk pregnancy, unspecified, unspecified trimester: Secondary | ICD-10-CM

## 2023-02-02 DIAGNOSIS — R202 Paresthesia of skin: Secondary | ICD-10-CM

## 2023-02-02 DIAGNOSIS — O3663X0 Maternal care for excessive fetal growth, third trimester, not applicable or unspecified: Secondary | ICD-10-CM

## 2023-02-02 NOTE — Progress Notes (Signed)
Pt presents for ROB visit. Pt c/o pain and numbness in the right hand and pain in the groin.

## 2023-02-02 NOTE — Progress Notes (Signed)
   PRENATAL VISIT NOTE  Subjective:  Nicole Keller is a 32 y.o. G3P2002 at [redacted]w[redacted]d being seen today for ongoing prenatal care.  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  Patient reports  groin pain x1 days after a long walk.  .  Contractions: Not present. Vag. Bleeding: None.  Movement: Present. Denies leaking of fluid.   The following portions of the patient's history were reviewed and updated as appropriate: allergies, current medications, past family history, past medical history, past social history, past surgical history and problem list.   Objective:   Vitals:   02/02/23 0951  BP: 107/70  Pulse: 82  Weight: 179 lb 6.6 oz (81.4 kg)    Fetal Status: Fetal Heart Rate (bpm): 154 Fundal Height: 31 cm Movement: Present     General:  Alert, oriented and cooperative. Patient is in no acute distress.  Skin: Skin is warm and dry. No rash noted.   Cardiovascular: Normal heart rate noted  Respiratory: Normal respiratory effort, no problems with respiration noted  Abdomen: Soft, gravid, appropriate for gestational age.  Pain/Pressure: Present     Pelvic: Cervical exam deferred        Extremities: Normal range of motion.  Edema: Trace  Mental Status: Normal mood and affect. Normal behavior. Normal judgment and thought content.   Assessment and Plan:  Pregnancy: G3P2002 at [redacted]w[redacted]d 1. Supervision of high risk pregnancy, antepartum - Patient doing well.  - She reports vigorous and frequent fetal movement.   2. [redacted] weeks gestation of pregnancy - Reviewed  3rd trimester expectations   3. Excessive fetal growth affecting management of pregnancy in third trimester, single or unspecified fetus - Fundal height appropriate for gestational age.  - Patient verbalized concerns for weight gain in pregnancy  - reviewed normal expected weight gain in pregnancy   4. 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 Normal Labs   5. Numbness and tingling - Reviewed carpel tunnel as a normal discomfort of pregnancy.  - Patient reports getting good relief with the use of wrist braces at night. Encouraged continued use.   6. Inguinal pain, unspecified laterality - Reviewed pelvic pain as a normal discomfort of pregnancy.  - encouraged the use of a maternity support belt as needed. Especially when taking long walks.   Preterm labor symptoms and general obstetric precautions including but not limited to vaginal bleeding, contractions, leaking of fluid and fetal movement were reviewed in detail with the patient. Please refer to After Visit Summary for other counseling recommendations.   Return in about 2 weeks (around 02/16/2023) for HROB.  Future Appointments  Date Time Provider Department Center  02/16/2023  9:55 AM Lennart Pall, MD [facility] None  03/02/2023  9:55 AM Hermina Staggers, MD [facility] None  03/04/2023  3:45 PM [facility] US5 [facility]  05/18/2023  9:30 AM Shamleffer, Konrad Dolores, MD [facility] None   Mieczyslaw Stamas (Danella Deis) Suzie Portela, MSN, CNM  Center for [facility]  02/02/2023 11:30 AM

## 2023-02-16 ENCOUNTER — Ambulatory Visit (INDEPENDENT_AMBULATORY_CARE_PROVIDER_SITE_OTHER): Payer: BC Managed Care – PPO | Admitting: Obstetrics and Gynecology

## 2023-02-16 VITALS — BP 113/67 | HR 92 | Wt 180.0 lb

## 2023-02-16 DIAGNOSIS — E890 Postprocedural hypothyroidism: Secondary | ICD-10-CM

## 2023-02-16 DIAGNOSIS — O3663X0 Maternal care for excessive fetal growth, third trimester, not applicable or unspecified: Secondary | ICD-10-CM

## 2023-02-16 DIAGNOSIS — O0990 Supervision of high risk pregnancy, unspecified, unspecified trimester: Secondary | ICD-10-CM

## 2023-02-16 DIAGNOSIS — E039 Hypothyroidism, unspecified: Secondary | ICD-10-CM

## 2023-02-16 DIAGNOSIS — O99280 Endocrine, nutritional and metabolic diseases complicating pregnancy, unspecified trimester: Secondary | ICD-10-CM

## 2023-02-16 DIAGNOSIS — Z3A32 32 weeks gestation of pregnancy: Secondary | ICD-10-CM

## 2023-02-16 DIAGNOSIS — Z3480 Encounter for supervision of other normal pregnancy, unspecified trimester: Secondary | ICD-10-CM

## 2023-02-16 NOTE — Progress Notes (Signed)
   PRENATAL VISIT NOTE  Subjective:  Nicole Keller is a 32 y.o. G3P2002 at [redacted]w[redacted]d being seen today for ongoing prenatal care.  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  Patient reports  groin pain x1 days after a long walk and b/l hand numbness / tingling.  . Reports swelling and tingling improved with use of hand splints. Denies loss of grip strength or dropping of items. Contractions: Not present. Vag. Bleeding: None.  Movement: Present. Denies leaking of fluid.   The following portions of the patient's history were reviewed and updated as appropriate: allergies, current medications, past family history, past medical history, past social history, past surgical history and problem list.   Objective:   Vitals:   02/16/23 0956  BP: 113/67  Pulse: 92  Weight: 81.6 kg    Fetal Status: Fetal Heart Rate (bpm): 156   Movement: Present     General:  Alert, oriented and cooperative. Patient is in no acute distress.  Skin: Skin is warm and dry. No rash noted.   Cardiovascular: Normal heart rate noted  Respiratory: Normal respiratory effort, no problems with respiration noted  Abdomen: Soft, gravid, appropriate for gestational age.  Pain/Pressure: Present     Pelvic: Cervical exam deferred        Extremities: Normal range of motion.  Edema: Trace  Mental Status: Normal mood and affect. Normal behavior. Normal judgment and thought content.   Assessment and Plan:  Pregnancy: G3P2002 at [redacted]w[redacted]d 1. Supervision of high risk pregnancy, antepartum - Patient  doing well.  - Third trimester labs normal including thyroid labs  - She reports vigorous and frequent fetal movement.   2. [redacted] weeks gestation of pregnancy - Reviewed 3rd trimester expectations   3. Excessive fetal growth affecting management of pregnancy in third trimester, single or unspecified fetus - Fundal height appropriate for gestational age.  - Reviewed GTT (normal), Thyroid labs (normal)   4. 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 Normal Labs   5. Numbness and tingling - Reviewed carpel tunnel as a normal discomfort of pregnancy.  - Patient reports getting good relief with the use of wrist braces at night. Encouraged continued use.   6. Inguinal pain, unspecified laterality - Reviewed pelvic pain as a normal discomfort of pregnancy.  - encouraged the use of a maternity support belt as needed. Especially when taking long walks.   Preterm labor symptoms and general obstetric precautions including but not limited to vaginal bleeding, contractions, leaking of fluid and fetal movement were reviewed in detail with the patient. Please refer to After Visit Summary for other counseling recommendations.   Return in about 2 weeks (around 03/02/2023).  Future Appointments  Date Time Provider Department Center  03/02/2023  9:55 AM Hermina Staggers, MD [facility] None  03/04/2023  3:45 PM [facility] US[facility]  05/18/2023  9:30 AM Shamleffer, Konrad Dolores, MD [facility] None   Mittie Bodo, MD Family Medicine - Obstetrics Fellow

## 2023-02-16 NOTE — Progress Notes (Signed)
ROB, c/o numbness, swelling, and tingling in her hands.

## 2023-02-26 ENCOUNTER — Other Ambulatory Visit: Payer: Self-pay | Admitting: General Practice

## 2023-02-26 ENCOUNTER — Telehealth: Payer: Self-pay | Admitting: General Practice

## 2023-02-26 MED ORDER — TERCONAZOLE 0.4 % VA CREA: 1.0000 | TOPICAL_CREAM | Freq: Every day | VAGINAL | 0 refills | Status: DC

## 2023-03-02 ENCOUNTER — Encounter: Payer: Self-pay | Admitting: Obstetrics and Gynecology

## 2023-03-02 ENCOUNTER — Ambulatory Visit: Payer: BC Managed Care – PPO | Admitting: Obstetrics and Gynecology

## 2023-03-02 VITALS — BP 97/62 | HR 89 | Wt 183.0 lb

## 2023-03-02 DIAGNOSIS — Z2839 Other underimmunization status: Secondary | ICD-10-CM

## 2023-03-02 DIAGNOSIS — Z3480 Encounter for supervision of other normal pregnancy, unspecified trimester: Secondary | ICD-10-CM

## 2023-03-02 DIAGNOSIS — O3663X0 Maternal care for excessive fetal growth, third trimester, not applicable or unspecified: Secondary | ICD-10-CM

## 2023-03-02 DIAGNOSIS — E0500 Thyrotoxicosis with diffuse goiter without thyrotoxic crisis or storm: Secondary | ICD-10-CM

## 2023-03-02 DIAGNOSIS — O09899 Supervision of other high risk pregnancies, unspecified trimester: Secondary | ICD-10-CM

## 2023-03-02 MED ORDER — FAMOTIDINE 20 MG PO TABS
20.0000 mg | ORAL_TABLET | Freq: Two times a day (BID) | ORAL | 3 refills | Status: DC
Start: 2023-03-02 — End: 2024-01-27

## 2023-03-02 NOTE — Progress Notes (Signed)
Subjective:  Nicole Keller is a 32 y.o. G3P2002 at [redacted]w[redacted]d being seen today for ongoing prenatal care.  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  Patient reports  general discomforts of pregnancy .  Contractions: Irritability. Vag. Bleeding: None.  Movement: Present. Denies leaking of fluid.   The following portions of the patient's history were reviewed and updated as appropriate: allergies, current medications, past family history, past medical history, past social history, past surgical history and problem list. Problem list updated.  Objective:   Vitals:   03/02/23 1017  BP: 97/62  Pulse: 89  Weight: 183 lb (83 kg)    Fetal Status: Fetal Heart Rate (bpm): 152   Movement: Present     General:  Alert, oriented and cooperative. Patient is in no acute distress.  Skin: Skin is warm and dry. No rash noted.   Cardiovascular: Normal heart rate noted  Respiratory: Normal respiratory effort, no problems with respiration noted  Abdomen: Soft, gravid, appropriate for gestational age. Pain/Pressure: Absent     Pelvic:  Cervical exam deferred        Extremities: Normal range of motion.  Edema: Trace  Mental Status: Normal mood and affect. Normal behavior. Normal judgment and thought content.   Urinalysis:      Assessment and Plan:  Pregnancy: G3P2002 at [redacted]w[redacted]d  1. Supervision of other normal pregnancy, antepartum Stable GBS and vaginal cultures next visit  2. Graves disease Stable  3. Rubella non-immune status, antepartum Vaccine PP  4. Excessive  fetal growth affecting management of pregnancy in third trimester, single or unspecified fetus Growth scan this week  Preterm labor symptoms and general obstetric precautions including but not limited to vaginal bleeding, contractions, leaking of fluid and fetal movement were reviewed in detail with the patient. Please refer to After Visit Summary for other counseling recommendations.  Return in about 2 weeks (around 03/16/2023) for OB visit, face to face, any provider.   Hermina Staggers, MD

## 2023-03-04 ENCOUNTER — Ambulatory Visit: Payer: BC Managed Care – PPO | Attending: Maternal & Fetal Medicine

## 2023-03-04 DIAGNOSIS — O99283 Endocrine, nutritional and metabolic diseases complicating pregnancy, third trimester: Secondary | ICD-10-CM | POA: Insufficient documentation

## 2023-03-04 DIAGNOSIS — O3663X0 Maternal care for excessive fetal growth, third trimester, not applicable or unspecified: Secondary | ICD-10-CM

## 2023-03-04 DIAGNOSIS — Z3A34 34 weeks gestation of pregnancy: Secondary | ICD-10-CM

## 2023-03-04 DIAGNOSIS — E039 Hypothyroidism, unspecified: Secondary | ICD-10-CM | POA: Diagnosis not present

## 2023-03-04 DIAGNOSIS — O99213 Obesity complicating pregnancy, third trimester: Secondary | ICD-10-CM

## 2023-03-04 DIAGNOSIS — E669 Obesity, unspecified: Secondary | ICD-10-CM

## 2023-03-09 ENCOUNTER — Encounter: Payer: Self-pay | Admitting: Obstetrics and Gynecology

## 2023-03-14 ENCOUNTER — Emergency Department (HOSPITAL_COMMUNITY): Payer: BC Managed Care – PPO

## 2023-03-14 ENCOUNTER — Emergency Department (HOSPITAL_COMMUNITY)
Admission: EM | Admit: 2023-03-14 | Discharge: 2023-03-14 | Disposition: A | Discharge status: Home / Self Care | Payer: BC Managed Care – PPO | Attending: Emergency Medicine | Admitting: Emergency Medicine

## 2023-03-14 ENCOUNTER — Other Ambulatory Visit: Payer: Self-pay

## 2023-03-14 ENCOUNTER — Encounter (HOSPITAL_COMMUNITY): Payer: Self-pay

## 2023-03-14 DIAGNOSIS — M7989 Other specified soft tissue disorders: Secondary | ICD-10-CM | POA: Diagnosis not present

## 2023-03-14 DIAGNOSIS — O26893 Other specified pregnancy related conditions, third trimester: Secondary | ICD-10-CM | POA: Diagnosis not present

## 2023-03-14 DIAGNOSIS — M79641 Pain in right hand: Secondary | ICD-10-CM | POA: Insufficient documentation

## 2023-03-14 DIAGNOSIS — O99891 Other specified diseases and conditions complicating pregnancy: Secondary | ICD-10-CM | POA: Diagnosis not present

## 2023-03-14 DIAGNOSIS — Z3A36 36 weeks gestation of pregnancy: Secondary | ICD-10-CM | POA: Insufficient documentation

## 2023-03-14 DIAGNOSIS — Z7982 Long term (current) use of aspirin: Secondary | ICD-10-CM | POA: Diagnosis not present

## 2023-03-14 DIAGNOSIS — Z3A34 34 weeks gestation of pregnancy: Secondary | ICD-10-CM | POA: Diagnosis not present

## 2023-03-14 NOTE — Discharge Instructions (Addendum)
You were seen in the emerged from today for evaluation of your hand pain.  Your ultrasound did not show any blood clot.  This is likely carpal tunnel or some lymphedema from your pregnancy.  I do recommend following up with orthopedic that you were told to go at your scheduled appointment on Thursday.  In the meantime, you can take Tylenol for pain.  Remember to rest and elevate your hand.  I included more information on this in the discharge paperwork.  If you have any color changes, temperature changes, inability to move your hand, fever, redness, please return to your nearest emergency department for reevaluation.  If you have any other concerns, new or worsening symptoms, please return to the nearest emergency department for reevaluation.  Contact a health care provider if: Your pain does not get better after a few days. Your pain gets worse. Your pain affects your ability to do your daily activities. Your hand becomes warm, red, or swollen. Your hand is numb or tingling. Get help right away if: Your hand is extremely swollen or is an unusual shape. Your hand or fingers turn white or blue. You cannot move your hand, wrist, or fingers.

## 2023-03-14 NOTE — ED Triage Notes (Signed)
Pt arrived POV from home c/o right hand pain since she got pregnant that has gotten worse. Pt states her hand swells off and on but the last week it has just gotten worse. Pt has taken tylenol with no relief. Her OBGYN said they could give a steroid shot and never scheduled her for one. Pt is [redacted] weeks pregnant.

## 2023-03-14 NOTE — Progress Notes (Signed)
VASCULAR LAB    Left upper extremity venous duplex has been performed.  See CV proc for preliminary results.  Messaged negative results to Achille Rich, PA-C via secure chat  Sherren Kerns, RVT 03/14/2023, 3:39 PM

## 2023-03-14 NOTE — ED Provider Notes (Signed)
Physical Exam  BP (!) 149/85 (BP Location: Right Arm)   Pulse 90   Temp 98.3 F (36.8 C) (Oral)   Resp 20   Ht 5\' 1"  (1.549 m)   Wt 83 kg   LMP 07/09/2022 (Exact Date)   SpO2 100%   BMI 34.58 kg/m   Physical Exam Vitals and nursing note reviewed.  Constitutional:      General: She is not in acute distress.    Appearance: Normal appearance. She is not toxic-appearing.  Eyes:     General: No scleral icterus. Pulmonary:     Effort: Pulmonary effort is normal. No respiratory distress.  Musculoskeletal:     Comments: Minimal swelling noted to the right hand and fingers.  There is no overlying cellulitis, erythema, warmth, or skin changes noted.  She has brisk cap refill present on all 5 fingers.  Compartments are soft.  Does have flexion extension of her fingers and wrist still present however does have some pain.  Negative Tinel's.  Palpable radial pulses.  Sensation reportedly intact as well.  Skin:    General: Skin is dry.     Findings: No rash.  Neurological:     General: No focal deficit present.     Mental Status: She is alert. Mental status is at baseline.  Psychiatric:        Mood and Affect: Mood normal.     Procedures  Procedures  ED Course / MDM    Medical Decision Making  Accepted handoff at shift change from [facility], PA-C. Please see prior provider note for more detail.   Briefly: Patient is 32 y.o. F presents to the ER for evaluation of right hand pain and swelling for the past month worsening for the past week.   DDX: concern for potential blood clot  Plan: Follow-up on ultrasound  [place] results Right: No evidence of deep vein thrombosis in the upper extremity. No evidence of superficial vein thrombosis in the upper extremity.  Left: No evidence of thrombosis in the subclavian.   Her compartments are soft I doubt any compartment syndrome.  There is likely some lymphedema or possibly carpal tunnel from her pregnancy.  She is neurovascular intact  distally and has palpable radial pulses.  She does have a scheduled appointment with [facility] on Thursday.  Encouraged to take Tylenol discussed RICE method.  Ace wrap provided for her as well.  I showed her a lymphedema glove that she can order on Amazon or pick them up at a medical supply store to help with some of the swelling and probably provide her some support.  We discussed return precautions were flag symptoms.  Patient and family verbalized understanding agreed to the plan.  Patient is stable being discharged home in good condition.   UE VENOUS DUPLEX (7am - 7pm)  Result Date: 03/14/2023 UPPER VENOUS STUDY  Patient Name:  Nicole Keller  Date of Exam:   03/14/2023 Medical Rec #: [id number]      Accession #:    [id number] Date of Birth: August 06, 1990      Patient Gender: F Patient Age:   32 years Exam Location:  [facility] Procedure:      VAS [place] UPPER EXTREMITY VENOUS DUPLEX Referring Phys: [facility] GOWENS --------------------------------------------------------------------------------  Indications: Pain and swelling of fingers and hand Other Indications: [redacted] weeks pregnant. Comparison Study: No prior study on file Performing Technologist: Sherren Kerns RVS  Examination Guidelines: A complete evaluation includes B-mode imaging, spectral Doppler, color Doppler, and power Doppler  as needed of all accessible portions of each vessel. Bilateral testing is considered an integral part of a complete examination. Limited examinations for reoccurring indications may be performed as noted.  Right Findings: +----------+------------+---------+-----------+----------+-------+ RIGHT     CompressiblePhasicitySpontaneousPropertiesSummary +----------+------------+---------+-----------+----------+-------+ IJV           Full       Yes       Yes                      +----------+------------+---------+-----------+----------+-------+ Subclavian               Yes       Yes                       +----------+------------+---------+-----------+----------+-------+ Axillary                 Yes       Yes                      +----------+------------+---------+-----------+----------+-------+ Brachial      Full                                          +----------+------------+---------+-----------+----------+-------+ Radial        Full                                          +----------+------------+---------+-----------+----------+-------+ Ulnar         Full                                          +----------+------------+---------+-----------+----------+-------+ Cephalic      Full                                          +----------+------------+---------+-----------+----------+-------+ Basilic       Full                                          +----------+------------+---------+-----------+----------+-------+  Left Findings: +----------+------------+---------+-----------+----------+-------+ LEFT      CompressiblePhasicitySpontaneousPropertiesSummary +----------+------------+---------+-----------+----------+-------+ Subclavian               Yes       Yes                      +----------+------------+---------+-----------+----------+-------+  Summary:  Right: No evidence of deep vein thrombosis in the upper extremity. No evidence of superficial vein thrombosis in the upper extremity.  Left: No evidence of thrombosis in the subclavian.  *See table(s) above for measurements and observations.  Diagnosing physician: Lemar Livings MD Electronically signed by Lemar Livings MD on 03/14/2023 at 4:08:08 PM.    Final          Achille Rich, PA-C 03/14/23 1704    Terrilee Files, MD 03/15/23 1046

## 2023-03-14 NOTE — ED Provider Notes (Signed)
[place] EMERGENCY DEPARTMENT AT [facility] Provider Note   CSN: [id number] Arrival date & time: 03/14/23  1151     History  Chief Complaint  Patient presents with   Hand Pain    Nicole Keller is a 32 y.o. female who is currently [redacted] weeks pregnant who presents to the ED complaining of right hand pain and swelling.  Notes that has been going on for several weeks but became worse about 1 week ago and she is now having severe pain and diffuse swelling to the right hand with difficulty moving it.  No history of DVT.  No fall or injury to the hand.  Was seen by her OB/GYN who believed that it was likely secondary to carpal tunnel syndrome and referred her to orthopedics.  She has an appointment scheduled this upcoming Thursday for likely joint injection for treatment.  She has been taking Tylenol regularly without improvement in symptoms.       Home Medications Prior to Admission medications   Medication Sig Start Date End Date Taking? Authorizing Provider  aspirin EC 81 MG tablet Take 1 tablet (81 mg total) by mouth daily. 10/15/22   Corlis Hove, NP  famotidine (PEPCID) 20 MG tablet Take 1 tablet (20 mg total) by mouth 2 (two) times daily. 03/02/23   Hermina Staggers, MD  levothyroxine (SYNTHROID) 137 MCG tablet Take 1 tablet (137 mcg total) by mouth as directed. 2 tabs on Sundays and Wednesdays and 1 tab rest of the week 08/14/22   Shamleffer, Konrad Dolores, MD  Prenatal Vit-Fe Fumarate-FA (MULTIVITAMIN-PRENATAL) 27-0.8 MG TABS tablet Take 1 tablet by mouth daily at 12 noon.    [provider]  terconazole (TERAZOL 7) 0.4 % vaginal cream Place 1 applicator vaginally at bedtime. 02/26/23   Warden Fillers, MD      Allergies    Patient has no known allergies.    Review of Systems   Review of Systems  All other systems reviewed and are negative.   Physical Exam Updated Vital Signs BP (!) 149/85 (BP Location: Right Arm)   Pulse 90   Temp 98.3 F (36.8 C)  (Oral)   Resp 20   Ht 5\' 1"  (1.549 m)   Wt 83 kg   LMP 07/09/2022 (Exact Date)   SpO2 100%   BMI 34.58 kg/m  Physical Exam Vitals and nursing note reviewed.  Constitutional:      General: She is not in acute distress.    Appearance: Normal appearance.  HENT:     Head: Normocephalic and atraumatic.     Mouth/Throat:     Mouth: Mucous membranes are moist.  Eyes:     Conjunctiva/sclera: Conjunctivae normal.  Cardiovascular:     Rate and Rhythm: Normal rate and regular rhythm.  Pulmonary:     Effort: Pulmonary effort is normal.     Breath sounds: Normal breath sounds.  Abdominal:     General: Abdomen is flat.     Palpations: Abdomen is soft.  Musculoskeletal:     Cervical back: Neck supple.     Comments: Diffuse edema to the right hand which extends to the distal forearm, 2+ radial pulse, normal capillary refill, soft compartments, no overlying erythema or increased warmth, normal range of motion at the elbow and shoulder, negative Phalen's test, negative Tinel's sign  Skin:    General: Skin is warm and dry.     Capillary Refill: Capillary refill takes less than 2 seconds.  Neurological:  Mental Status: She is alert. Mental status is at baseline.  Psychiatric:        Behavior: Behavior normal.     ED Results / Procedures / Treatments   Labs (all labs ordered are listed, but only abnormal results are displayed) Labs Reviewed - No data to display  EKG None  Radiology No results found.  Procedures Procedures    Medications Ordered in ED Medications - No data to display  ED Course/ Medical Decision Making/ A&P                                 Medical Decision Making Amount and/or Complexity of Data Reviewed Radiology: ordered. Decision-making details documented in ED Course.   Medical Decision Making:   Lynsy Prodoehl is a 32 y.o. female who presented to the ED today with hand pain detailed above.    Patient's presentation is complicated by their history  of pregnancy.  Complete initial physical exam performed, notably the patient was in no acute distress, nontoxic-appearing.  Neurovascularly intact.  Soft compartments.    Reviewed and confirmed nursing documentation for past medical history, family history, social history.    Initial Assessment:   With the patient's presentation, differential diagnosis includes but is not limited to fracture, dislocation, carpal tunnel syndrome, DVT, sprain, strain, septic joint, compartment syndrome.  This is most consistent with an acute complicated illness  Initial Plan:  [place] to assess for DVT Objective evaluation as below reviewed   Initial Study Results:   Radiology:  Pending   Final Assessment and Plan:   32 year old pregnant female presents to the ED with right hand pain and swelling.  Ongoing for some time but worse over the last week.  She does have swelling up to the distal forearm.  Neurovascularly intact with soft compartments.  Afebrile, nontoxic-appearing.  OB/GYN evaluated, concern for carpal tunnel and patient referred to outpatient orthopedics.  Pain uncontrolled with Tylenol at home so patient came here for further evaluation.  Does have diffuse swelling distally as mentioned.  With pregnancy, at risk for DVT and agreeable to proceed with ultrasound.  Ultrasound ordered.  No overlying erythema or increased warmth.  Do not suspect septic joint.  Care signed out to oncoming PA-C, Achille Rich, pending imaging and disposition.  Discussed with patient that unfortunately we are limited with pain management given her current pregnancy but I will provide with additional orthopedics follow-up should ultrasound be negative and she desires to follow-up with a different clinic outpatient to see if they can see her sooner.  Stable at time of care transition.   Clinical Impression: No diagnosis found.   Data Unavailable           Final Clinical Impression(s) / ED Diagnoses Final diagnoses:  None     Rx / DC Orders ED Discharge Orders     None         Tonette Lederer, PA-C 03/14/23 1546    Tegeler, Canary Brim, MD 03/15/23 [phone]

## 2023-03-17 ENCOUNTER — Other Ambulatory Visit (HOSPITAL_COMMUNITY)
Admission: RE | Admit: 2023-03-17 | Discharge: 2023-03-17 | Disposition: A | Discharge status: Home / Self Care | Payer: BC Managed Care – PPO | Source: Ambulatory Visit | Attending: Obstetrics and Gynecology | Admitting: Obstetrics and Gynecology

## 2023-03-17 ENCOUNTER — Ambulatory Visit: Payer: BC Managed Care – PPO | Admitting: Obstetrics and Gynecology

## 2023-03-17 VITALS — BP 112/72 | HR 78 | Wt 185.0 lb

## 2023-03-17 DIAGNOSIS — Z3A36 36 weeks gestation of pregnancy: Secondary | ICD-10-CM

## 2023-03-17 DIAGNOSIS — Z3480 Encounter for supervision of other normal pregnancy, unspecified trimester: Secondary | ICD-10-CM | POA: Diagnosis not present

## 2023-03-17 DIAGNOSIS — E890 Postprocedural hypothyroidism: Secondary | ICD-10-CM

## 2023-03-17 DIAGNOSIS — O09893 Supervision of other high risk pregnancies, third trimester: Secondary | ICD-10-CM

## 2023-03-17 DIAGNOSIS — Z2839 Other underimmunization status: Secondary | ICD-10-CM

## 2023-03-17 DIAGNOSIS — O3663X0 Maternal care for excessive fetal growth, third trimester, not applicable or unspecified: Secondary | ICD-10-CM

## 2023-03-17 DIAGNOSIS — E0500 Thyrotoxicosis with diffuse goiter without thyrotoxic crisis or storm: Secondary | ICD-10-CM

## 2023-03-17 DIAGNOSIS — Z923 Personal history of irradiation: Secondary | ICD-10-CM

## 2023-03-17 NOTE — Progress Notes (Signed)
Pt has complaints about hands, has ?referral for injections -  has appt for tomorrow.

## 2023-03-17 NOTE — Progress Notes (Signed)
   PRENATAL VISIT NOTE  Subjective:  Nicole Keller is a 32 y.o. G3P2002 at [redacted]w[redacted]d being seen today for ongoing prenatal care.  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  Patient reports  carpal tunnel syndrome. Has pain in bilateral hands. Seen at urgent care over the weekend - dopplers of both UE performed and normal. Is seeing Dr. Crissie Keller tomorrow for steroid injections .  Contractions: Not present. Vag. Bleeding: None.  Movement: Present. Denies leaking of fluid.   The following portions of the patient's history were reviewed and updated as appropriate: allergies, current medications, past family history, past medical history, past social history, past surgical history and problem list.   Objective:   Vitals:   03/17/23 1601  BP: 112/72  Pulse: 78  Weight: 185 lb (83.9 kg)   Fetal Status: Fetal Heart Rate (bpm): 150   Movement: Present     General:  Alert, oriented and cooperative. Patient is in no acute distress.  Skin: Skin is warm and dry. No rash noted.   Cardiovascular: Normal heart rate noted  Respiratory: Normal respiratory effort, no problems with respiration noted  Abdomen: Soft, gravid, appropriate for gestational age.  Pain/Pressure: Present     SCE ft/l/hi - performed in presence of chaperone  Assessment and Plan:  Pregnancy: G3P2002 at [redacted]w[redacted]d 1. Supervision of other normal pregnancy, antepartum 2. [redacted] weeks gestation of pregnancy - Culture, beta strep (group b only) - Cervicovaginal ancillary only( [place])  3. Graves  disease 4. 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 History of radioactive iodine thyroid ablation On synthroid TSH 01/18/23 1.44; negative TRAb  6. Excessive fetal growth affecting management of pregnancy in third trimester, single or unspecified fetus - RESOLVED @34 /6: cephalic, 2811g (40%), AC >99%, posterior, AFI 17.91 Normal glucose testing  7. Rubella non-immune status, antepartum PP MMR  Please refer to After Visit Summary for other counseling recommendations.   Return in about 1 week (around 03/24/2023) for return OB at 37 weeks.  Future Appointments  Date Time Provider Department Center  03/18/2023  4:15 PM Venora Maples, MD [facility]  03/31/2023  4:10 PM Hermina Staggers, MD [facility] None  05/18/2023  9:30 AM Shamleffer, Konrad Dolores, MD [facility] None   Nicole Pall, MD

## 2023-03-18 ENCOUNTER — Other Ambulatory Visit: Payer: Self-pay

## 2023-03-18 ENCOUNTER — Encounter: Payer: Self-pay | Admitting: Family Medicine

## 2023-03-18 ENCOUNTER — Ambulatory Visit (INDEPENDENT_AMBULATORY_CARE_PROVIDER_SITE_OTHER): Payer: BC Managed Care – PPO | Admitting: Family Medicine

## 2023-03-18 VITALS — BP 124/77 | HR 83 | Wt 184.4 lb

## 2023-03-18 DIAGNOSIS — Z3A36 36 weeks gestation of pregnancy: Secondary | ICD-10-CM | POA: Diagnosis not present

## 2023-03-18 DIAGNOSIS — G5600 Carpal tunnel syndrome, unspecified upper limb: Secondary | ICD-10-CM

## 2023-03-18 DIAGNOSIS — O99353 Diseases of the nervous system complicating pregnancy, third trimester: Secondary | ICD-10-CM | POA: Diagnosis not present

## 2023-03-18 NOTE — Progress Notes (Signed)
Carpal Tunnel Injection Procedure Note After obtaining informed verbal and written consent for carpal tunnel injection, proper site was identified on the right wrist 1 cm medial to the palmaris longus tendon and 1 cm proximal to the distal palmar crease. The area was cleaned with alcohol and then anesthetized with hurricane spray. A mixture of 1.5 cc 10 mg/mL Kenalog (total of 15 mg) and 1.5 cc of 1% lidocaine was injected by directing the needle towards the base of the thumb at a 45 degree angle at a depth of approximately 1 cm. Mixture flowed easily and patient reported no paresthesias during the procedure. A bandaid was then applied and patient instructed to mobilize wrist for remainder of day to help solution spread. Patient tolerated the procedure well.  The other wrist was not injected.    4:34 PM 03/18/23   Venora Maples, MD/MPH Attending Family Medicine Physician, [facility], [facility]

## 2023-03-19 LAB — CERVICOVAGINAL ANCILLARY ONLY
Chlamydia: NEGATIVE
Comment: NEGATIVE
Comment: NORMAL
Neisseria Gonorrhea: NEGATIVE

## 2023-03-21 LAB — CULTURE, BETA STREP (GROUP B ONLY): Strep Gp B Culture: NEGATIVE

## 2023-03-24 ENCOUNTER — Ambulatory Visit (INDEPENDENT_AMBULATORY_CARE_PROVIDER_SITE_OTHER): Payer: BC Managed Care – PPO | Admitting: Obstetrics and Gynecology

## 2023-03-24 VITALS — BP 117/75 | HR 75 | Wt 185.1 lb

## 2023-03-24 DIAGNOSIS — O09899 Supervision of other high risk pregnancies, unspecified trimester: Secondary | ICD-10-CM

## 2023-03-24 DIAGNOSIS — E0500 Thyrotoxicosis with diffuse goiter without thyrotoxic crisis or storm: Secondary | ICD-10-CM

## 2023-03-24 DIAGNOSIS — O09893 Supervision of other high risk pregnancies, third trimester: Secondary | ICD-10-CM

## 2023-03-24 DIAGNOSIS — Z2839 Other underimmunization status: Secondary | ICD-10-CM

## 2023-03-24 DIAGNOSIS — E890 Postprocedural hypothyroidism: Secondary | ICD-10-CM

## 2023-03-24 DIAGNOSIS — Z3480 Encounter for supervision of other normal pregnancy, unspecified trimester: Secondary | ICD-10-CM

## 2023-03-24 DIAGNOSIS — Z3A37 37 weeks gestation of pregnancy: Secondary | ICD-10-CM

## 2023-03-24 DIAGNOSIS — O3663X0 Maternal care for excessive fetal growth, third trimester, not applicable or unspecified: Secondary | ICD-10-CM

## 2023-03-24 DIAGNOSIS — Z923 Personal history of irradiation: Secondary | ICD-10-CM

## 2023-03-24 NOTE — Progress Notes (Signed)
   PRENATAL VISIT NOTE  Subjective:  Nicole Keller is a 32 y.o. G3P2002 at [redacted]w[redacted]d being seen today for ongoing prenatal care.  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  Patient reports  reports not feeling as much movement. Reports usually active especially in evening but isnt doing as much movement as she usually does.  Contractions: Not present.  .  Movement: (!) Decreased (Says not moving as much). Denies leaking of fluid.   The following portions of the patient's history were reviewed and updated as appropriate: allergies, current medications, past family history, past medical history, past social history, past surgical history and problem list.   Objective:   Vitals:   03/24/23 1413  BP: 117/75  Pulse: 75  Weight: 185 lb 1.6 oz (84 kg)    Fetal Status: Fetal Heart Rate (bpm): 148 Fundal Height: 37 cm Movement: (!) Decreased (Says not moving as much)  Presentation: Vertex  General:  Alert, oriented and cooperative. Patient is in no acute distress.  Skin: Skin is warm and dry. No rash noted.   Cardiovascular: Normal heart rate noted  Respiratory: Normal respiratory effort, no problems with respiration noted  Abdomen: Soft, gravid, appropriate for gestational age.  Pain/Pressure: Present     Pelvic: Cervical exam deferred        Extremities: Normal range of motion.  Edema: Trace  Mental Status: Normal mood and affect. Normal behavior. Normal judgment and thought content.   Assessment  and Plan:  Pregnancy: G3P2002 at [redacted]w[redacted]d 1. Supervision of other normal pregnancy, antepartum BP and reactive NST  FH appropriate  2. 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 Graves disease 4. History of radioactive iodine thyroid ablation Stable on synthroid  5. Excessive fetal growth affecting management of pregnancy in third trimester, single or unspecified fetus 8/22 u/s afi nml, efw 78%, AC >99%, per MFM no further exams needed unless indicated  6. Rubella non-immune status, antepartum MMR pp  7. [redacted] weeks gestation of pregnancy Decreased fetal movement, reactive NST and feeling movement on exam. Strict precautions given to go to MAU for movement, and other ob/labor precautions discussed    Term labor symptoms and general obstetric precautions including but not limited to vaginal bleeding, contractions, leaking of fluid and fetal movement were reviewed in detail with the patient. Please refer to After Visit Summary for other counseling recommendations.   Return in one week for routine prenatal   Future Appointments  Date Time Provider Department Center  03/31/2023  4:10 PM Hermina Staggers, MD [facility] None  05/18/2023  9:30 AM Shamleffer, Konrad Dolores, MD [facility] None    Albertine Grates, FNP

## 2023-03-30 ENCOUNTER — Telehealth: Payer: Self-pay

## 2023-03-30 NOTE — Telephone Encounter (Signed)
Transition Care Management Unsuccessful Follow-up Telephone Call  Date of discharge and from where:  [name] 9/1  Attempts:  1st Attempt  Reason for unsuccessful TCM follow-up call:  No answer/busy   [name] [place]  [facility] Guide, Phone: [phone] Website: [name]

## 2023-03-31 ENCOUNTER — Telehealth: Payer: Self-pay

## 2023-03-31 ENCOUNTER — Encounter: Payer: Self-pay | Admitting: Obstetrics and Gynecology

## 2023-03-31 ENCOUNTER — Ambulatory Visit (INDEPENDENT_AMBULATORY_CARE_PROVIDER_SITE_OTHER): Payer: BC Managed Care – PPO | Admitting: Obstetrics and Gynecology

## 2023-03-31 VITALS — Wt 186.0 lb

## 2023-03-31 DIAGNOSIS — Z3480 Encounter for supervision of other normal pregnancy, unspecified trimester: Secondary | ICD-10-CM

## 2023-03-31 DIAGNOSIS — Z3A38 38 weeks gestation of pregnancy: Secondary | ICD-10-CM

## 2023-03-31 DIAGNOSIS — Z2839 Other underimmunization status: Secondary | ICD-10-CM

## 2023-03-31 DIAGNOSIS — E890 Postprocedural hypothyroidism: Secondary | ICD-10-CM

## 2023-03-31 DIAGNOSIS — O09893 Supervision of other high risk pregnancies, third trimester: Secondary | ICD-10-CM

## 2023-03-31 DIAGNOSIS — Z1339 Encounter for screening examination for other mental health and behavioral disorders: Secondary | ICD-10-CM | POA: Diagnosis not present

## 2023-03-31 DIAGNOSIS — O09899 Supervision of other high risk pregnancies, unspecified trimester: Secondary | ICD-10-CM

## 2023-03-31 NOTE — Telephone Encounter (Signed)
Transition Care Management Unsuccessful Follow-up Telephone Call  Date of discharge and from where:  Nicole Keller  Attempts:  2nd Attempt  Reason for unsuccessful TCM follow-up call:  No answer/busy   Nicole Keller  [facility], [facility] Guide, Phone: [phone] Website: [name]

## 2023-03-31 NOTE — Progress Notes (Signed)
Subjective:  Nicole Keller is a 32 y.o. G3P2002 at [redacted]w[redacted]d being seen today for ongoing prenatal care.  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  Patient reports  general discomforts of pregnancy .  Contractions: Irregular. Vag. Bleeding: None.  Movement: Present. Denies leaking of fluid.   The following portions of the patient's history were reviewed and updated as appropriate: allergies, current medications, past family history, past medical history, past social history, past surgical history and problem list. Problem list updated.  Objective:   Vitals:   03/31/23 1555  Weight: 186 lb (84.4 kg)    Fetal Status: Fetal Heart Rate (bpm): 156   Movement: Present     General:  Alert, oriented and cooperative. Patient is in no acute distress.  Skin: Skin is warm and dry. No rash noted.   Cardiovascular: Normal heart rate noted  Respiratory: Normal respiratory effort, no problems with respiration noted  Abdomen: Soft, gravid, appropriate for gestational age. Pain/Pressure: Present     Pelvic:  Cervical exam performed        Extremities: Normal range of motion.  Edema: None  Mental Status: Normal mood and affect. Normal behavior. Normal judgment and thought content.   Urinalysis:      Assessment and Plan:  Pregnancy: G3P2002 at [redacted]w[redacted]d  1. Supervision of other normal pregnancy, antepartum Stable Labor precautions  2. Rubella non-immune status, antepartum Vaccine PP  3. Hypothyroidism due  to radiation (s/p I-131 ablation for Graves) Stable  Term labor symptoms and general obstetric precautions including but not limited to vaginal bleeding, contractions, leaking of fluid and fetal movement were reviewed in detail with the patient. Please refer to After Visit Summary for other counseling recommendations.  Return in about 1 week (around 04/07/2023) for OB visit, face to face, any provider.   Hermina Staggers, MD

## 2023-04-08 ENCOUNTER — Ambulatory Visit (INDEPENDENT_AMBULATORY_CARE_PROVIDER_SITE_OTHER): Payer: BC Managed Care – PPO | Admitting: Student

## 2023-04-08 VITALS — BP 124/81 | HR 71 | Wt 183.5 lb

## 2023-04-08 DIAGNOSIS — O09893 Supervision of other high risk pregnancies, third trimester: Secondary | ICD-10-CM | POA: Diagnosis not present

## 2023-04-08 DIAGNOSIS — Z2839 Other underimmunization status: Secondary | ICD-10-CM

## 2023-04-08 DIAGNOSIS — Z3480 Encounter for supervision of other normal pregnancy, unspecified trimester: Secondary | ICD-10-CM

## 2023-04-08 DIAGNOSIS — Z3A39 39 weeks gestation of pregnancy: Secondary | ICD-10-CM

## 2023-04-08 DIAGNOSIS — O09899 Supervision of other high risk pregnancies, unspecified trimester: Secondary | ICD-10-CM

## 2023-04-08 DIAGNOSIS — E890 Postprocedural hypothyroidism: Secondary | ICD-10-CM

## 2023-04-08 NOTE — Progress Notes (Signed)
   PRENATAL VISIT NOTE  Subjective:  Nicole Keller is a 32 y.o. G3P2002 at [redacted]w[redacted]d being seen today for ongoing prenatal care.  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  Patient reports no complaints.  Contractions: Irritability. Vag. Bleeding: None.  Movement: Present. Denies leaking of fluid.   The following portions of the patient's history were reviewed and updated as appropriate: allergies, current medications, past family history, past medical history, past social history, past surgical history and problem list.   Objective:   Vitals:   04/08/23 1631  BP: 124/81  Pulse: 71  Weight: 183 lb 8 oz (83.2 kg)    Fetal Status: Fetal Heart Rate (bpm): 145   Movement: Present     General:  Alert, oriented and cooperative. Patient is in no acute distress.  Skin: Skin is warm and dry. No rash noted.   Cardiovascular: Normal heart rate noted  Respiratory: Normal respiratory effort, no problems with respiration noted  Abdomen: Soft, gravid, appropriate for gestational age.  Pain/Pressure: Present     Pelvic: Cervical exam performed in the presence of a chaperone Dilation: Fingertip Effacement (%): 0 Station: -3  Extremities: Normal range of motion.  Edema: None  Mental Status: Normal mood and affect. Normal behavior. Normal judgment and thought content.   Assessment and Plan:  Pregnancy: G3P2002 at [redacted]w[redacted]d 1. Supervision of other normal pregnancy, antepartum - frequent fetal movement  2. [redacted]  weeks gestation of pregnancy - IOL scheduled for next week  3. Rubella non-immune status, antepartum - recommend MMR postpartum  4. 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 Please refer to After Visit Summary for other counseling recommendations.   No follow-ups on file.  Future Appointments  Date Time Provider Department Center  04/15/2023  7:15 AM [facility] SCHED ROOM [facility] None  05/18/2023  9:30 AM Shamleffer, Konrad Dolores, MD [facility] None    Corlis Hove, NP

## 2023-04-08 NOTE — Progress Notes (Signed)
Pt reports fetal movement with some irritability. Induction is scheduled for 04/15/23

## 2023-04-09 ENCOUNTER — Inpatient Hospital Stay (HOSPITAL_COMMUNITY): Payer: BC Managed Care – PPO | Admitting: Anesthesiology

## 2023-04-09 ENCOUNTER — Inpatient Hospital Stay (HOSPITAL_COMMUNITY)
Admission: AD | Admit: 2023-04-09 | Discharge: 2023-04-11 | DRG: 807 | Disposition: A | Discharge status: Home / Self Care | Payer: BC Managed Care – PPO | Attending: Obstetrics and Gynecology | Admitting: Obstetrics and Gynecology

## 2023-04-09 ENCOUNTER — Encounter (HOSPITAL_COMMUNITY): Payer: Self-pay | Admitting: Obstetrics and Gynecology

## 2023-04-09 ENCOUNTER — Other Ambulatory Visit: Payer: Self-pay

## 2023-04-09 DIAGNOSIS — E0500 Thyrotoxicosis with diffuse goiter without thyrotoxic crisis or storm: Secondary | ICD-10-CM | POA: Diagnosis not present

## 2023-04-09 DIAGNOSIS — O09899 Supervision of other high risk pregnancies, unspecified trimester: Secondary | ICD-10-CM

## 2023-04-09 DIAGNOSIS — Z833 Family history of diabetes mellitus: Secondary | ICD-10-CM | POA: Diagnosis not present

## 2023-04-09 DIAGNOSIS — Z79890 Hormone replacement therapy (postmenopausal): Secondary | ICD-10-CM | POA: Diagnosis not present

## 2023-04-09 DIAGNOSIS — Z7982 Long term (current) use of aspirin: Secondary | ICD-10-CM

## 2023-04-09 DIAGNOSIS — O26893 Other specified pregnancy related conditions, third trimester: Secondary | ICD-10-CM | POA: Diagnosis present

## 2023-04-09 DIAGNOSIS — K219 Gastro-esophageal reflux disease without esophagitis: Secondary | ICD-10-CM | POA: Diagnosis not present

## 2023-04-09 DIAGNOSIS — O4292 Full-term premature rupture of membranes, unspecified as to length of time between rupture and onset of labor: Principal | ICD-10-CM | POA: Diagnosis present

## 2023-04-09 DIAGNOSIS — Z3480 Encounter for supervision of other normal pregnancy, unspecified trimester: Secondary | ICD-10-CM

## 2023-04-09 DIAGNOSIS — O3663X0 Maternal care for excessive fetal growth, third trimester, not applicable or unspecified: Secondary | ICD-10-CM | POA: Diagnosis present

## 2023-04-09 DIAGNOSIS — Z8349 Family history of other endocrine, nutritional and metabolic diseases: Secondary | ICD-10-CM | POA: Diagnosis not present

## 2023-04-09 DIAGNOSIS — Z3A40 40 weeks gestation of pregnancy: Secondary | ICD-10-CM

## 2023-04-09 DIAGNOSIS — O99284 Endocrine, nutritional and metabolic diseases complicating childbirth: Secondary | ICD-10-CM | POA: Diagnosis present

## 2023-04-09 DIAGNOSIS — O9962 Diseases of the digestive system complicating childbirth: Secondary | ICD-10-CM | POA: Diagnosis present

## 2023-04-09 DIAGNOSIS — O99214 Obesity complicating childbirth: Secondary | ICD-10-CM | POA: Diagnosis present

## 2023-04-09 DIAGNOSIS — R0989 Other specified symptoms and signs involving the circulatory and respiratory systems: Secondary | ICD-10-CM | POA: Diagnosis not present

## 2023-04-09 DIAGNOSIS — Z23 Encounter for immunization: Secondary | ICD-10-CM

## 2023-04-09 DIAGNOSIS — Z5986 Financial insecurity: Secondary | ICD-10-CM | POA: Diagnosis not present

## 2023-04-09 DIAGNOSIS — Z2839 Other underimmunization status: Secondary | ICD-10-CM

## 2023-04-09 DIAGNOSIS — Z83438 Family history of other disorder of lipoprotein metabolism and other lipidemia: Secondary | ICD-10-CM | POA: Diagnosis not present

## 2023-04-09 DIAGNOSIS — E890 Postprocedural hypothyroidism: Secondary | ICD-10-CM | POA: Diagnosis present

## 2023-04-09 DIAGNOSIS — O3660X0 Maternal care for excessive fetal growth, unspecified trimester, not applicable or unspecified: Secondary | ICD-10-CM | POA: Diagnosis present

## 2023-04-09 DIAGNOSIS — O480 Post-term pregnancy: Secondary | ICD-10-CM | POA: Diagnosis not present

## 2023-04-09 DIAGNOSIS — Z8249 Family history of ischemic heart disease and other diseases of the circulatory system: Secondary | ICD-10-CM

## 2023-04-09 DIAGNOSIS — O4290 Premature rupture of membranes, unspecified as to length of time between rupture and onset of labor, unspecified weeks of gestation: Principal | ICD-10-CM | POA: Diagnosis present

## 2023-04-09 DIAGNOSIS — H0469 Other changes of lacrimal passages: Secondary | ICD-10-CM | POA: Diagnosis not present

## 2023-04-09 DIAGNOSIS — Q105 Congenital stenosis and stricture of lacrimal duct: Secondary | ICD-10-CM | POA: Diagnosis not present

## 2023-04-09 DIAGNOSIS — O1002 Pre-existing essential hypertension complicating childbirth: Secondary | ICD-10-CM | POA: Diagnosis not present

## 2023-04-09 LAB — CBC
HCT: 37.2 % (ref 36.0–46.0)
HCT: 38.6 % (ref 36.0–46.0)
Hemoglobin: 12.1 g/dL (ref 12.0–15.0)
Hemoglobin: 12.4 g/dL (ref 12.0–15.0)
MCH: 26.4 pg (ref 26.0–34.0)
MCH: 25.9 pg — ABNORMAL LOW (ref 26.0–34.0)
MCHC: 32.5 g/dL (ref 30.0–36.0)
MCHC: 32.1 g/dL (ref 30.0–36.0)
MCV: 81.2 fL (ref 80.0–100.0)
MCV: 80.8 fL (ref 80.0–100.0)
Platelets: 284 10*3/uL (ref 150–400)
Platelets: 302 10*3/uL (ref 150–400)
RBC: 4.58 MIL/uL (ref 3.87–5.11)
RBC: 4.78 MIL/uL (ref 3.87–5.11)
RDW: 15.1 % (ref 11.5–15.5)
RDW: 15.1 % (ref 11.5–15.5)
WBC: 10.2 10*3/uL (ref 4.0–10.5)
WBC: 11.8 10*3/uL — ABNORMAL HIGH (ref 4.0–10.5)
nRBC: 0 % (ref 0.0–0.2)
nRBC: 0 % (ref 0.0–0.2)

## 2023-04-09 LAB — POCT FERN TEST

## 2023-04-09 LAB — RPR: RPR Ser Ql: NONREACTIVE

## 2023-04-09 LAB — TYPE AND SCREEN
ABO/RH(D): O POS
Antibody Screen: NEGATIVE

## 2023-04-09 MED ORDER — EPHEDRINE 5 MG/ML INJ: 10.0000 mg | INTRAVENOUS | Status: DC | PRN

## 2023-04-09 MED ORDER — LIDOCAINE-EPINEPHRINE (PF) 2 %-1:200000 IJ SOLN
INTRAMUSCULAR | Status: DC | PRN
  Administered 2023-04-09: 5 mL via EPIDURAL

## 2023-04-09 MED ORDER — ACETAMINOPHEN 325 MG PO TABS: 650.0000 mg | ORAL_TABLET | ORAL | Status: DC | PRN

## 2023-04-09 MED ORDER — LACTATED RINGERS IV SOLN: 500.0000 mL | INTRAVENOUS | Status: DC | PRN

## 2023-04-09 MED ORDER — TERBUTALINE SULFATE 1 MG/ML IJ SOLN: 0.2500 mg | Freq: Once | INTRAMUSCULAR | Status: DC | PRN

## 2023-04-09 MED ORDER — OXYTOCIN-SODIUM CHLORIDE 30-0.9 UT/500ML-% IV SOLN
1.0000 m[IU]/min | INTRAVENOUS | Status: DC
  Administered 2023-04-09: 2 m[IU]/min via INTRAVENOUS

## 2023-04-09 MED ORDER — LACTATED RINGERS IV SOLN: INTRAVENOUS | Status: DC

## 2023-04-09 MED ORDER — OXYTOCIN BOLUS FROM INFUSION
333.0000 mL | Freq: Once | INTRAVENOUS | Status: AC
  Administered 2023-04-09: 333 mL via INTRAVENOUS

## 2023-04-09 MED ORDER — OXYCODONE-ACETAMINOPHEN 5-325 MG PO TABS: 2.0000 | ORAL_TABLET | ORAL | Status: DC | PRN

## 2023-04-09 MED ORDER — LACTATED RINGERS IV SOLN: 500.0000 mL | Freq: Once | INTRAVENOUS | Status: DC

## 2023-04-09 MED ORDER — ONDANSETRON HCL 4 MG/2ML IJ SOLN: 4.0000 mg | Freq: Four times a day (QID) | INTRAMUSCULAR | Status: DC | PRN

## 2023-04-09 MED ORDER — FENTANYL-BUPIVACAINE-NACL 0.5-0.125-0.9 MG/250ML-% EP SOLN
12.0000 mL/h | EPIDURAL | Status: DC | PRN
  Administered 2023-04-09: 12 mL/h via EPIDURAL
  Filled 2023-04-09: qty 250

## 2023-04-09 MED ORDER — OXYCODONE-ACETAMINOPHEN 5-325 MG PO TABS: 1.0000 | ORAL_TABLET | ORAL | Status: DC | PRN

## 2023-04-09 MED ORDER — PHENYLEPHRINE 80 MCG/ML (10ML) SYRINGE FOR IV PUSH (FOR BLOOD PRESSURE SUPPORT): 80.0000 ug | PREFILLED_SYRINGE | INTRAVENOUS | Status: DC | PRN

## 2023-04-09 MED ORDER — PHENYLEPHRINE 80 MCG/ML (10ML) SYRINGE FOR IV PUSH (FOR BLOOD PRESSURE SUPPORT)
80.0000 ug | PREFILLED_SYRINGE | INTRAVENOUS | Status: DC | PRN
  Filled 2023-04-09: qty 10

## 2023-04-09 MED ORDER — LACTATED RINGERS IV SOLN
500.0000 mL | Freq: Once | INTRAVENOUS | Status: AC
  Administered 2023-04-09: 500 mL via INTRAVENOUS

## 2023-04-09 MED ORDER — LIDOCAINE HCL (PF) 1 % IJ SOLN: 30.0000 mL | INTRAMUSCULAR | Status: DC | PRN

## 2023-04-09 MED ORDER — DIPHENHYDRAMINE HCL 50 MG/ML IJ SOLN: 12.5000 mg | INTRAMUSCULAR | Status: DC | PRN

## 2023-04-09 MED ORDER — FENTANYL CITRATE (PF) 100 MCG/2ML IJ SOLN
50.0000 ug | INTRAMUSCULAR | Status: DC | PRN
  Administered 2023-04-09: 100 ug via INTRAVENOUS
  Filled 2023-04-09: qty 2

## 2023-04-09 MED ORDER — SOD CITRATE-CITRIC ACID 500-334 MG/5ML PO SOLN: 30.0000 mL | ORAL | Status: DC | PRN

## 2023-04-09 MED ORDER — OXYTOCIN-SODIUM CHLORIDE 30-0.9 UT/500ML-% IV SOLN
2.5000 [IU]/h | INTRAVENOUS | Status: DC
  Administered 2023-04-09: 2.5 [IU]/h via INTRAVENOUS
  Filled 2023-04-09: qty 500

## 2023-04-09 NOTE — Anesthesia Preprocedure Evaluation (Signed)
Anesthesia Evaluation  Patient identified by MRN, date of birth, ID band Patient awake    Reviewed: Allergy & Precautions, Patient's Chart, lab work & pertinent test results  Airway Mallampati: II       Dental no notable dental hx.    Pulmonary    Pulmonary exam normal        Cardiovascular hypertension, Normal cardiovascular exam     Neuro/Psych  PSYCHIATRIC DISORDERS Anxiety Depression     Neuromuscular disease    GI/Hepatic ,GERD  Medicated,,  Endo/Other  Hypothyroidism    Renal/GU      Musculoskeletal   Abdominal   Peds  Hematology   Anesthesia Other Findings   Reproductive/Obstetrics (+) Pregnancy                             Anesthesia Physical Anesthesia Plan  ASA: 2  Anesthesia Plan: Epidural   Post-op Pain Management:    Induction:   PONV Risk Score and Plan:   Airway Management Planned:   Additional Equipment: None  Intra-op Plan:   Post-operative Plan:   Informed Consent: I have reviewed the patients History and Physical, chart, labs and discussed the procedure including the risks, benefits and alternatives for the proposed anesthesia with the patient or authorized representative who has indicated his/her understanding and acceptance.       Plan Discussed with:   Anesthesia Plan Comments: (Lab Results      Component                Value               Date                      WBC                      11.8 (H)            04/09/2023                HGB                      12.4                04/09/2023                HCT                      38.6                04/09/2023                MCV                      80.8                04/09/2023                PLT                      302                 04/09/2023           )       Anesthesia Quick Evaluation

## 2023-04-09 NOTE — Progress Notes (Signed)
Nicole Keller is a 32 y.o. G3P2002 at [redacted]w[redacted]d by ultrasound admitted for PROM  Subjective: Patient was seen resting in bed. She had just finished bouncing on the ball and was getting ready to walk around the unit. She notes that her contractions are getting stronger, but she feels that moving helps slightly. She declines pain medication at this time.   Objective: BP 104/65   Pulse 77   Temp 97.6 F (36.4 C) (Axillary)   Resp 16   Ht 5\' 1"  (1.549 m)   Wt 82.6 kg   LMP 07/09/2022 (Exact Date)   SpO2 99%   BMI 34.39 kg/m  No intake/output data recorded. No intake/output data recorded.  FHT:  FHR: 140 bpm, variability: moderate,  accelerations:  Present,  decelerations:  Absent UC:   regular, every 4 minutes SVE:   Dilation: 5 Effacement (%): 70 Station: -2 Exam by:: Ardeen Jourdain, RN  Labs: Lab Results  Component Value Date   WBC 10.2 04/09/2023   HGB 12.1 04/09/2023   HCT 37.2 04/09/2023   MCV 81.2 04/09/2023   PLT 284 04/09/2023    Assessment / Plan: Augmentation of labor, progressing well  Labor:  Progressing on pitocin. PROM.  Fetal Wellbeing:  Category I Pain Control:  Labor support without medications I/D:  GBS neg, Rubella non-immune Anticipated MOD:  NSVD  Nicole Keller, Medical Student 04/09/2023, 5:42 PM

## 2023-04-09 NOTE — Anesthesia Procedure Notes (Signed)
Epidural Patient location during procedure: OB Start time: 04/09/2023 9:31 PM End time: 04/09/2023 9:36 PM  Staffing Anesthesiologist: Shelton Silvas, MD Performed: anesthesiologist   Preanesthetic Checklist Completed: patient identified, IV checked, site marked, risks and benefits discussed, surgical consent, monitors and equipment checked, pre-op evaluation and timeout performed  Epidural Patient position: sitting Prep: DuraPrep Patient monitoring: heart rate, continuous pulse ox and blood pressure Approach: midline Location: L3-L4 Injection technique: LOR saline  Needle:  Needle type: Tuohy  Needle gauge: 17 G Needle length: 9 cm Catheter type: closed end flexible Catheter size: 20 Guage Test dose: negative and 1.5% lidocaine  Assessment Events: blood not aspirated, no cerebrospinal fluid, injection not painful, no injection resistance and no paresthesia  Additional Notes LOR @ 5.5  Patient identified. Risks/Benefits/Options discussed with patient including but not limited to bleeding, infection, nerve damage, paralysis, failed block, incomplete pain control, headache, blood pressure changes, nausea, vomiting, reactions to medications, itching and postpartum back pain. Confirmed with bedside nurse the patient's most recent platelet count. Confirmed with patient that they are not currently taking any anticoagulation, have any bleeding history or any family history of bleeding disorders. Patient expressed understanding and wished to proceed. All questions were answered. Sterile technique was used throughout the entire procedure. Please see nursing notes for vital signs. Test dose was given through epidural catheter and negative prior to continuing to dose epidural or start infusion. Warning signs of high block given to the patient including shortness of breath, tingling/numbness in hands, complete motor block, or any concerning symptoms with instructions to call for help. Patient  was given instructions on fall risk and not to get out of bed. All questions and concerns addressed with instructions to call with any issues or inadequate analgesia.    Reason for block:procedure for pain

## 2023-04-09 NOTE — MAU Note (Signed)
Reactive NST-monitors off per CNM-strip reviewed  pt returned to lobby

## 2023-04-09 NOTE — Progress Notes (Signed)
Labor Progress Note Nicole Keller is a 32 y.o. G3P2002 at [redacted]w[redacted]d presented for PROM S: coping well, breathing through contractions. Some relief with IV fentanyl, would like to avoid epidural  O:  BP 117/72   Pulse 93   Temp 98 F (36.7 C) (Oral)   Resp 16   Ht 5\' 1"  (1.549 m)   Wt 82.6 kg   LMP 07/09/2022 (Exact Date)   SpO2 99%   BMI 34.39 kg/m  EFM: 135/moderate variability/accels/intermittent early decels  CVE: Dilation: 5 Effacement (%): 70 Station: -2 Presentation: Vertex Exam by:: Orvan Seen, RN   A&P: 32 y.o. [id number] [redacted]w[redacted]d admitted for PROM #Labor: Progressing well.  #Pain: IV fentanyl and Nitrous #FWB: Cat II, overall reassuring #GBS negative  #CHTN: Monitor Bps - normal in L&D thus far.   Wyn Forster, MD 8:50 PM

## 2023-04-09 NOTE — Progress Notes (Signed)
Labor Progress Note Shontae Rosiles is a 32 y.o. G3P2002 at [redacted]w[redacted]d presented for PROM S: more comfortable with epidural, but foley burning adjusted balloon past fetal head with some improvement.   O:  BP 117/67   Pulse 89   Temp 98 F (36.7 C) (Oral)   Resp 18   Ht 5\' 1"  (1.549 m)   Wt 82.6 kg   LMP 07/09/2022 (Exact Date)   SpO2 100%   BMI 34.39 kg/m  EFM: 135/moderate variability/accels/no decels  CVE: Dilation: 8 Effacement (%): 80, 90 Cervical Position: Middle Station: -2 Presentation: Vertex Exam by:: Dr. Leanora Cover   A&P: 32 y.o. [id number] [redacted]w[redacted]d admitted for PROM #Labor: Progressing well.  #Pain: Epidural #FWB: Cat I #GBS negative   #CHTN: Monitor Bps - normal in L&D thus far.   Wyn Forster, MD 10:31 PM

## 2023-04-09 NOTE — MAU Note (Signed)
Pt to room for NST per Dorathy Daft CNM External monitors applied-

## 2023-04-09 NOTE — H&P (Signed)
OBSTETRIC ADMISSION HISTORY AND PHYSICAL  Nicole Keller is a 32 y.o. female G3P2002 with IUP at [redacted]w[redacted]d by LMP presenting for LOF. She reports +FMs, no VB, no blurry vision, headaches or peripheral edema, and RUQ pain.  She plans on breast and formula feeding. She request IUD for birth control. She received her prenatal care at  [facility]    Dating: By LMP --->  Estimated Date of Delivery: 04/09/23  Sono:    @[redacted]w[redacted]d , CWD, normal anatomy, cephalic presentation, 2811g, 81% EFW, AC >99%iloe, normal AFI 17   Prenatal History/Complications:  Previous LGA that resolved, now 78%ile Grave's disease s/p ablation now hypothyroid - TRAB antibody negative on 01/12/23 RNI - MMR after delivery CHTN - no meds  Past Medical History: Past Medical History:  Diagnosis Date   Depression    Depression with anxiety 10/12/2017   Depression with anxiety 10/12/2017   Essential hypertension 08/09/2018   Essential hypertension   Hypertension    Hyperthyroidism    Hypothyroid    Hypothyroidism 09/06/2018   Pap smear abnormality of cervix with LGSIL 12/07/2017   +HPV > colposcopy      Tachycardia 10/12/2017   Palpitations/tachycardia   Vaginal discharge 05/07/2020    Past Surgical History: Past Surgical History:  Procedure Laterality Date   NO PAST SURGERIES      Obstetrical History: OB History     Gravida  3   Para  2   Term  2   Preterm      AB      Living  2      SAB      IAB      Ectopic      Multiple      Live Births  2           Social History Social History   Socioeconomic History   Marital status: Single    Spouse name: Not on file   Number of children: 2   Years of education: Not on file   Highest education level: Not on file  Occupational History   Not on file  Tobacco Use   Smoking status: Never   Smokeless tobacco: Never  Vaping Use   Vaping status: Never Used  Substance and Sexual Activity   Alcohol use: Not Currently    Comment: occ, not since  confirmed pregnancy   Drug use: Not Currently   Sexual activity: Yes    Partners: Male    Birth control/protection: None  Other Topics Concern   Not on file  Social History Narrative   ** Merged History Encounter **       Social Determinants of Health   Financial Resource Strain: Medium Risk (11/26/2017)   Overall Financial Resource Strain (CARDIA)    Difficulty of Paying Living Expenses: Somewhat hard  Food Insecurity: No Food Insecurity (11/26/2017)   Hunger Vital Sign    Worried About Running Out of Food in the Last Year: Never true    Ran Out of Food in the Last Year: Never true  Transportation Needs: No Transportation Needs (11/26/2017)   PRAPARE - Administrator, Civil Service (Medical): No    Lack of Transportation (Non-Medical): No  Physical Activity: Not on file  Stress: Not on file  Social Connections: Not on file    Family History: Family History  Problem Relation Age of Onset   Hypertension Mother    Thyroid disease Mother    Hyperlipidemia Father    Thyroid disease Sister  Hypertension Maternal Grandmother    Diabetes Paternal Grandmother     Allergies: No Known Allergies  Medications Prior to Admission  Medication Sig Dispense Refill Last Dose   aspirin EC 81 MG tablet Take 1 tablet (81 mg total) by mouth daily. 60 tablet 2    famotidine (PEPCID) 20 MG tablet Take 1 tablet (20 mg total) by mouth 2 (two) times daily. 60 tablet 3    levothyroxine (SYNTHROID) 137 MCG tablet Take 1 tablet (137 mcg total) by mouth as directed. 2 tabs on Sundays and Wednesdays and 1 tab rest of the week 36 tablet 3    Prenatal Vit-Fe Fumarate-FA (MULTIVITAMIN-PRENATAL) 27-0.8 MG TABS tablet Take 1 tablet by mouth daily at 12 noon.        Review of Systems   All systems reviewed and negative except as stated in HPI  Blood pressure 123/81, pulse 81, temperature 97.7 F (36.5 C), temperature source Oral, resp. rate 17, height 5\' 1"  (1.549 m), weight 82.6 kg,  last menstrual period 07/09/2022, SpO2 99%. General appearance: alert, cooperative, and no distress Lungs: clear to auscultation bilaterally Heart: regular rate and rhythm Abdomen: soft, non-tender; bowel sounds normal Extremities: Homans sign is negative, no sign of DVT Presentation: cephalic confirmed by US Fetal monitoring - not yet monitored    Prenatal labs: ABO, Rh: O/Positive/-- (02/22 1629) Antibody: Negative (02/22 1629) Rubella: <0.90 (02/22 1629) RPR: Non Reactive (07/08 0902)  HBsAg: Negative (02/22 1629)  HIV: Non Reactive (07/08 0902)  GBS: Negative/-- (09/04 1624)  2 hr Glucola Normal Genetic screening  LR Anatomy US Normal  Prenatal Transfer Tool  Maternal Diabetes: No Genetic Screening: Normal Maternal Ultrasounds/Referrals: Normal Fetal Ultrasounds or other Referrals:  None Maternal Substance Abuse:  No Significant Maternal Medications:  Meds include: Syntroid Significant Maternal Lab Results:  Group B Strep negative and Other: TRAB negative Number of Prenatal Visits:greater than 3 verified prenatal visits Other Comments:  None  Results for orders placed or performed during the hospital encounter of 04/09/23 (from the past 24 hour(s))  POCT fern test   Collection Time: 04/09/23  1:50 AM  Result Value Ref Range   POCT Fern Test     Positive      Patient Active Problem List   Diagnosis Date Noted   Pregnancy related carpal tunnel syndrome in third trimester 03/18/2023   History of radioactive iodine thyroid ablation 12/24/2022   Large for gestational age fetus affecting management of mother 12/24/2022   Rubella non-immune status, antepartum 09/04/2022   Supervision of other normal pregnancy, antepartum 09/03/2022   Graves disease 04/22/2022   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 G3P2002 at [redacted]w[redacted]d here for PROM  #Labor: Not yet actively laboring #Pain: Pain medication as desired #FWB:  Will obtain monitoring once a bed is available #ID:  GBS negative #MOF: Both #MOC: IUD #Circ:  NA #CHTN: Monitor Bps - normal in MAU.    Milas Hock, MD  04/09/2023, 3:23 AM

## 2023-04-09 NOTE — Discharge Summary (Signed)
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)   Graves disease   Supervision of other normal pregnancy, antepartum   Rubella non-immune status, antepartum   Large for gestational age fetus affecting management of mother  Additional problems: ***    Discharge diagnosis: {DX.:23714}                                              Post partum procedures:{Postpartum procedures:23558} Augmentation: {Augmentation:20782} Complications: {OB Labor/Delivery Complications:20784}  Hospital course: {Courses:23701}  Magnesium Sulfate received: {Mag received:30440022} BMZ received: {BMZ received:30440023} Rhophylac:{Rhophylac received:30440032} ZHY:{QMV:78469629} T-DaP:{Tdap:23962} Flu: {BMW:41324} RSV Vaccine received: {RSV:31013} Transfusion:{Transfusion received:30440034}  Immunizations received: Immunization History  Administered Date(s) Administered   Influenza,inj,Quad PF,6+ Mos 03/24/2017   Influenza-Unspecified 06/12/2021   PFIZER Comirnaty(Gray Top)Covid-19 Tri-Sucrose Vaccine 08/12/2020   PFIZER(Purple Top)SARS-COV-2 Vaccination 07/22/2020   Tdap 03/24/2017, 01/18/2023    Physical exam  Vitals:   04/09/23 2201 04/09/23 2218 04/09/23 2220 04/09/23 2232  BP: 117/67 (!) 110/93  (!) 115/58  Pulse: 89 (!) 119  93  Resp: 18 18  18   Temp:      TempSrc:      SpO2:  98% 100%   Weight:      Height:       General: {Exam; general:21111117} Lochia: {Desc; appropriate/inappropriate:30686::"appropriate"} Uterine Fundus: {Desc;  firm/soft:30687} Incision: {Exam; incision:21111123} DVT Evaluation: {Exam; dvt:2111122} Labs: Lab Results  Component Value Date   WBC 11.8 (H) 04/09/2023   HGB 12.4 04/09/2023   HCT 38.6 04/09/2023   MCV 80.8 04/09/2023   PLT 302 04/09/2023      Latest Ref Rng & Units 11/05/2021    2:43 PM  CMP  Glucose 70 - 99 mg/dL 85   BUN 6 - 20 mg/dL 8   Creatinine 4.01 - 0.27 mg/dL 2.53   Sodium 664 - 403 mmol/L 140   Potassium 3.5 - 5.2 mmol/L 4.3   Chloride 96 - 106 mmol/L 103   CO2 20 - 29 mmol/L 23   Calcium 8.7 - 10.2 mg/dL 9.3    Edinburgh Score:     No data to display         No data recorded  After visit meds:  Allergies as of 04/09/2023   No Known Allergies   Med Rec must be completed prior to using this [facility]***        Discharge home in stable condition Infant Feeding: {Baby feeding:23562} Infant Disposition:{CHL IP OB HOME WITH KVQQVZ:56387} Discharge instruction: per After Visit Summary and Postpartum booklet. Activity: Advance as tolerated. Pelvic rest for 6 weeks.  Diet: {OB FIEP:32951884} Future Appointments: Future Appointments  Date Time Provider Department Center  05/18/2023  9:30 AM Shamleffer, Konrad Dolores, MD [facility] None   Follow up Visit: Message sent to [facility] on ***  Please schedule this patient for a {Visit type:23955} postpartum visit in {Postpartum visit:23953} with the following provider: {Provider type:23954}. Additional Postpartum F/U:{PP Procedure:23957}  {Risk level:23960} pregnancy complicated by: {complication:23959} Delivery mode:  Vaginal, Spontaneous Anticipated Birth Control:  {Birth Control:23956}   04/09/2023 Wyn Forster, MD

## 2023-04-09 NOTE — MAU Note (Signed)
.  Nicole Keller is a 32 y.o. at [redacted]w[redacted]d here in MAU reporting: SROM AT 0000-clear fluid Denies vaginal bleeding or bloody show. Endorses + fetal movement  SVE per Dorathy Daft CNM 1.5cm thick  Onset of complaint: 0000 Pain score: 3 tightening and vaginal pressure Vitals:   04/09/23 0144  BP: 123/81  Pulse: 81  Resp: 17  Temp: 97.7 F (36.5 C)  SpO2: 99%     FHT:138bpm Lab orders placed from triage:  mau labor

## 2023-04-09 NOTE — Progress Notes (Signed)
Nicole Keller is a 32 y.o. G3P2002 at [redacted]w[redacted]d by ultrasound admitted for PROM.   Subjective: Patient was seen comfortably resting in bed. FOB was present. She noted that she her two rpior vaginal deliveries were done without any pain medication and would like to try for a delivery with no pain medications again.   Objective: BP 118/74   Pulse 77   Temp 97.7 F (36.5 C) (Oral)   Resp 16   Ht 5\' 1"  (1.549 m)   Wt 82.6 kg   LMP 07/09/2022 (Exact Date)   SpO2 99%   BMI 34.39 kg/m  No intake/output data recorded. No intake/output data recorded.  FHT:  FHR: 135 bpm, variability: moderate,  accelerations:  Present,  decelerations:  Absent UC:   regular, every 8-10 minutes, moderate SVE:   Dilation: 3 Effacement (%): 50 Station: -3 Exam by:: Ardeen Jourdain, RN  Labs: Lab Results  Component Value Date   WBC 10.8 01/18/2023   HGB 12.7 01/18/2023   HCT 38.9 01/18/2023   MCV 88 01/18/2023   PLT 299 01/18/2023    Assessment / Plan: PROM with augmentation of labor with pitocin, progressing well  Labor:  Progressing. Start on pitocin.  Preeclampsia:  N/A Fetal Wellbeing:  Category I Pain Control:  Labor support without medications I/D:   GBS neg, Rubella non-immune Anticipated MOD:  NSVD  Nicole Keller, Medical Student 04/09/2023, 11:06 AM

## 2023-04-09 NOTE — MAU Provider Note (Signed)
Monitors applied and assessing. Patient reports feeling abdominal "tightening" and intermittent contractions ~ 10-12 mins. She endorses positive fetal movement.   Bolton Canupp Danella Deis) Suzie Portela, MSN, CNM  [facility]  04/09/2023 5:27 AM

## 2023-04-10 ENCOUNTER — Encounter (HOSPITAL_COMMUNITY): Payer: Self-pay | Admitting: Obstetrics and Gynecology

## 2023-04-10 LAB — CBC
HCT: 34.8 % — ABNORMAL LOW (ref 36.0–46.0)
Hemoglobin: 11.5 g/dL — ABNORMAL LOW (ref 12.0–15.0)
MCH: 27.3 pg (ref 26.0–34.0)
MCHC: 33 g/dL (ref 30.0–36.0)
MCV: 82.7 fL (ref 80.0–100.0)
Platelets: 276 10*3/uL (ref 150–400)
RBC: 4.21 MIL/uL (ref 3.87–5.11)
RDW: 15.2 % (ref 11.5–15.5)
WBC: 15.3 10*3/uL — ABNORMAL HIGH (ref 4.0–10.5)
nRBC: 0 % (ref 0.0–0.2)

## 2023-04-10 MED ORDER — ACETAMINOPHEN 325 MG PO TABS: 650.0000 mg | ORAL_TABLET | ORAL | Status: DC | PRN

## 2023-04-10 MED ORDER — BENZOCAINE-MENTHOL 20-0.5 % EX AERO
1.0000 | INHALATION_SPRAY | CUTANEOUS | Status: DC | PRN
  Administered 2023-04-10: 1 via TOPICAL
  Filled 2023-04-10: qty 56

## 2023-04-10 MED ORDER — IBUPROFEN 600 MG PO TABS
600.0000 mg | ORAL_TABLET | Freq: Four times a day (QID) | ORAL | Status: DC
  Administered 2023-04-10 – 2023-04-11 (×6): 600 mg via ORAL
  Filled 2023-04-10 (×6): qty 1

## 2023-04-10 MED ORDER — SENNOSIDES-DOCUSATE SODIUM 8.6-50 MG PO TABS
2.0000 | ORAL_TABLET | ORAL | Status: DC
  Administered 2023-04-10 – 2023-04-11 (×2): 2 via ORAL
  Filled 2023-04-10 (×2): qty 2

## 2023-04-10 MED ORDER — DIPHENHYDRAMINE HCL 25 MG PO CAPS: 25.0000 mg | ORAL_CAPSULE | Freq: Four times a day (QID) | ORAL | Status: DC | PRN

## 2023-04-10 MED ORDER — LEVOTHYROXINE SODIUM 137 MCG PO TABS
274.0000 ug | ORAL_TABLET | ORAL | Status: DC
  Administered 2023-04-11: 274 ug via ORAL
  Filled 2023-04-10: qty 2

## 2023-04-10 MED ORDER — PRENATAL MULTIVITAMIN CH
1.0000 | ORAL_TABLET | Freq: Every day | ORAL | Status: DC
  Administered 2023-04-10 – 2023-04-11 (×2): 1 via ORAL
  Filled 2023-04-10 (×2): qty 1

## 2023-04-10 MED ORDER — DOCUSATE SODIUM 100 MG PO CAPS
100.0000 mg | ORAL_CAPSULE | Freq: Two times a day (BID) | ORAL | Status: DC
  Administered 2023-04-10 – 2023-04-11 (×3): 100 mg via ORAL
  Filled 2023-04-10 (×3): qty 1

## 2023-04-10 MED ORDER — LACTATED RINGERS IV SOLN: INTRAVENOUS | Status: DC

## 2023-04-10 MED ORDER — ONDANSETRON HCL 4 MG/2ML IJ SOLN: 4.0000 mg | INTRAMUSCULAR | Status: DC | PRN

## 2023-04-10 MED ORDER — LEVOTHYROXINE SODIUM 137 MCG PO TABS
137.0000 ug | ORAL_TABLET | ORAL | Status: DC
  Administered 2023-04-10: 137 ug via ORAL
  Filled 2023-04-10: qty 1

## 2023-04-10 MED ORDER — SIMETHICONE 80 MG PO CHEW: 80.0000 mg | CHEWABLE_TABLET | ORAL | Status: DC | PRN

## 2023-04-10 MED ORDER — COCONUT OIL OIL: 1.0000 | TOPICAL_OIL | Status: DC | PRN

## 2023-04-10 MED ORDER — WITCH HAZEL-GLYCERIN EX PADS: 1.0000 | MEDICATED_PAD | CUTANEOUS | Status: DC | PRN

## 2023-04-10 MED ORDER — MEASLES, MUMPS & RUBELLA VAC IJ SOLR
0.5000 mL | Freq: Once | INTRAMUSCULAR | Status: AC
  Administered 2023-04-11: 0.5 mL via SUBCUTANEOUS
  Filled 2023-04-10: qty 0.5

## 2023-04-10 MED ORDER — DIBUCAINE (PERIANAL) 1 % EX OINT: 1.0000 | TOPICAL_OINTMENT | CUTANEOUS | Status: DC | PRN

## 2023-04-10 MED ORDER — ZOLPIDEM TARTRATE 5 MG PO TABS: 5.0000 mg | ORAL_TABLET | Freq: Every evening | ORAL | Status: DC | PRN

## 2023-04-10 MED ORDER — TETANUS-DIPHTH-ACELL PERTUSSIS 5-2.5-18.5 LF-MCG/0.5 IM SUSY
0.5000 mL | PREFILLED_SYRINGE | Freq: Once | INTRAMUSCULAR | Status: DC
  Filled 2023-04-10: qty 0.5

## 2023-04-10 MED ORDER — INFLUENZA VIRUS VACC SPLIT PF (FLUZONE) 0.5 ML IM SUSY
0.5000 mL | PREFILLED_SYRINGE | INTRAMUSCULAR | Status: AC
  Administered 2023-04-11: 0.5 mL via INTRAMUSCULAR
  Filled 2023-04-10: qty 0.5

## 2023-04-10 MED ORDER — ONDANSETRON HCL 4 MG PO TABS: 4.0000 mg | ORAL_TABLET | ORAL | Status: DC | PRN

## 2023-04-10 NOTE — Social Work (Signed)
MOB was referred for history of depression/anxiety.  * Referral screened out by Clinical Social Worker because none of the following criteria appear to apply:  ~ History of anxiety/depression during this pregnancy, or of post-partum depression following prior delivery.  ~ Diagnosis of anxiety and/or depression within last 3 years OR * MOB's symptoms currently being treated with medication and/or therapy.  Per chart review MOB's diagnosis dates back to 2019. Per OB recorded no concerns noted during this pregnancy.  Please contact the Clinical Social Worker if needs arise, or by MOB request.   Wende Neighbors, LCSWA Clinical Social Worker [phone]

## 2023-04-10 NOTE — Progress Notes (Signed)
POSTPARTUM PROGRESS NOTE  Post Partum Day 1  Subjective:  Nicole Keller is a 32 y.o. G3P3003 s/p SVD at [redacted]w[redacted]d.  She reports she is doing well. No acute events overnight. She denies any problems with ambulating, voiding or po intake. Denies nausea or vomiting.  Pain is well controlled.  Lochia is appropriate.  Objective: Blood pressure (!) 100/56, pulse 75, temperature 98.4 F (36.9 C), temperature source Oral, resp. rate 18, height 5\' 1"  (1.549 m), weight 82.6 kg, last menstrual period 07/09/2022, SpO2 100%, unknown if currently breastfeeding.  Physical Exam:  General: alert, cooperative and no distress Chest: no respiratory distress Heart:regular rate, distal pulses intact Uterine Fundus: firm, appropriately tender DVT Evaluation: No calf swelling or tenderness Extremities: no edema Skin: warm, dry  Recent Labs    04/09/23 1017 04/09/23 1947  HGB 12.1 12.4  HCT 37.2 38.6    Assessment/Plan: Nicole Keller is a 32 y.o. G3P3003 s/p SVD at [redacted]w[redacted]d   PPD#1 - Doing well  Routine postpartum care  Contraception: OP IUD Feeding: both Dispo: Plan for discharge 9/29.   LOS: 1 day   Wyn Forster, MD OB Fellow  04/10/2023, 4:56 AM

## 2023-04-10 NOTE — Plan of Care (Signed)
  Problem: Education: Goal: Individualized Educational Video(s) Outcome: Progressing Goal: Individualized Newborn Educational Video(s) Outcome: Progressing   Problem: Activity: Goal: Will verbalize the importance of balancing activity with adequate rest periods Outcome: Progressing Goal: Ability to tolerate increased activity will improve Outcome: Progressing   Problem: Coping: Goal: Ability to identify and utilize available resources and services will improve Outcome: Progressing   Problem: Life Cycle: Goal: Chance of risk for complications during the postpartum period will decrease Outcome: Progressing   Problem: Role Relationship: Goal: Ability to demonstrate positive interaction with newborn will improve Outcome: Progressing   Problem: Skin Integrity: Goal: Demonstration of wound healing without infection will improve Outcome: Progressing

## 2023-04-10 NOTE — Anesthesia Postprocedure Evaluation (Signed)
Anesthesia Post Note  Patient: Nicole Keller  Procedure(s) Performed: AN AD HOC LABOR EPIDURAL     Patient location during evaluation: Mother Baby Anesthesia Type: Epidural Level of consciousness: awake and alert and oriented Pain management: satisfactory to patient Vital Signs Assessment: post-procedure vital signs reviewed and stable Respiratory status: respiratory function stable Cardiovascular status: stable Postop Assessment: no headache, no backache, epidural receding, patient able to bend at knees, no signs of nausea or vomiting, adequate PO intake and able to ambulate Anesthetic complications: no   No notable events documented.  Last Vitals:  Vitals:   04/10/23 1000 04/10/23 1400  BP: 105/67 112/69  Pulse: 72 66  Resp: 18 18  Temp: 36.8 C 36.9 C  SpO2: 100% 100%    Last Pain:  Vitals:   04/10/23 1400  TempSrc: Oral  PainSc:    Pain Goal:                   Stephen Turnbaugh

## 2023-04-10 NOTE — Plan of Care (Signed)
°  Problem: Education: Goal: Knowledge of condition will improve Outcome: Completed/Met

## 2023-04-10 NOTE — Plan of Care (Signed)
  Problem: Education: °Goal: Knowledge of Childbirth will improve °Outcome: Completed/Met °Goal: Ability to make informed decisions regarding treatment and plan of care will improve °Outcome: Completed/Met °Goal: Ability to state and carry out methods to decrease the pain will improve °Outcome: Completed/Met °Goal: Individualized Educational Video(s) °Outcome: Completed/Met °  °Problem: Coping: °Goal: Ability to verbalize concerns and feelings about labor and delivery will improve °Outcome: Completed/Met °  °Problem: Life Cycle: °Goal: Ability to make normal progression through stages of labor will improve °Outcome: Completed/Met °Goal: Ability to effectively push during vaginal delivery will improve °Outcome: Completed/Met °  °Problem: Role Relationship: °Goal: Will demonstrate positive interactions with the child °Outcome: Completed/Met °  °Problem: Safety: °Goal: Risk of complications during labor and delivery will decrease °Outcome: Completed/Met °  °Problem: Pain Management: °Goal: Relief or control of pain from uterine contractions will improve °Outcome: Completed/Met °  °Problem: Education: °Goal: Knowledge of General Education information will improve °Description: Including pain rating scale, medication(s)/side effects and non-pharmacologic comfort measures °Outcome: Completed/Met °  °Problem: Health Behavior/Discharge Planning: °Goal: Ability to manage health-related needs will improve °Outcome: Completed/Met °  °Problem: Clinical Measurements: °Goal: Ability to maintain clinical measurements within normal limits will improve °Outcome: Completed/Met °Goal: Will remain free from infection °Outcome: Completed/Met °Goal: Diagnostic test results will improve °Outcome: Completed/Met °Goal: Respiratory complications will improve °Outcome: Completed/Met °Goal: Cardiovascular complication will be avoided °Outcome: Completed/Met °  °Problem: Activity: °Goal: Risk for activity intolerance will decrease °Outcome:  Completed/Met °  °Problem: Nutrition: °Goal: Adequate nutrition will be maintained °Outcome: Completed/Met °  °Problem: Coping: °Goal: Level of anxiety will decrease °Outcome: Completed/Met °  °Problem: Elimination: °Goal: Will not experience complications related to bowel motility °Outcome: Completed/Met °Goal: Will not experience complications related to urinary retention °Outcome: Completed/Met °  °Problem: Pain Managment: °Goal: General experience of comfort will improve °Outcome: Completed/Met °  °Problem: Safety: °Goal: Ability to remain free from injury will improve °Outcome: Completed/Met °  °Problem: Skin Integrity: °Goal: Risk for impaired skin integrity will decrease °Outcome: Completed/Met °  °

## 2023-04-10 NOTE — Lactation Note (Addendum)
This note was copied from a baby's chart. Lactation Consultation Note  Patient Name: Nicole Keller [id number] Date: 04/10/2023 Age:34 hours Reason for consult: Initial assessment;Term;Maternal endocrine disorder  P3, Mother would like to breastfeed and formula feed.  Encouraged offering breast before formula to help establish mother's milk supply.  Reviewed volume guidelines.  Mother inquired about Synthroid L1 in "Dwain Sarna' Medications and Mothers' Milk".  Mother hand expressed drops. Attempted latching in football hold, noted a few sucks.  Baby became spitty and gagging.  RN entered room to check RR.    Maternal Data Hand express: Yes Breastfed and formula fed for 3 mos.with other children.  Goal is to breastfeed longer this time.   Feeding Mother's Current Feeding Choice: Breast Milk and Formula Interventions Interventions: Breast feeding basics reviewed;Assisted with latch;Skin to skin;Hand express;Education;LC Services brochure Consult Status Consult Status: Follow-up Date: 04/11/23 Follow-up type: In-patient    Nicole Pulley  RN BCLC 04/10/2023, 1:27 PM

## 2023-04-11 MED ORDER — IBUPROFEN 600 MG PO TABS: 600.0000 mg | ORAL_TABLET | Freq: Four times a day (QID) | ORAL | 0 refills | Status: DC | PRN

## 2023-04-11 MED ORDER — WITCH HAZEL-GLYCERIN EX PADS: 1.0000 | MEDICATED_PAD | CUTANEOUS | Status: DC | PRN

## 2023-04-11 MED ORDER — SENNOSIDES-DOCUSATE SODIUM 8.6-50 MG PO TABS: 2.0000 | ORAL_TABLET | ORAL | Status: DC

## 2023-04-11 MED ORDER — SIMETHICONE 80 MG PO CHEW: 80.0000 mg | CHEWABLE_TABLET | ORAL | Status: DC | PRN

## 2023-04-11 MED ORDER — IBUPROFEN 600 MG PO TABS: 600.0000 mg | ORAL_TABLET | Freq: Four times a day (QID) | ORAL | 0 refills | Status: DC

## 2023-04-11 MED ORDER — COCONUT OIL OIL: 1.0000 | TOPICAL_OIL | Status: DC | PRN

## 2023-04-11 MED ORDER — ACETAMINOPHEN 325 MG PO TABS: 650.0000 mg | ORAL_TABLET | ORAL | 0 refills | Status: DC | PRN

## 2023-04-11 NOTE — Plan of Care (Signed)
  Problem: Education: Goal: Individualized Educational Video(s) 04/11/2023 1046 by Donne Hazel, LPN Outcome: Adequate for Discharge 04/11/2023 0734 by Donne Hazel, LPN Outcome: Progressing Goal: Individualized Newborn Educational Video(s) 04/11/2023 1046 by Donne Hazel, LPN Outcome: Adequate for Discharge 04/11/2023 0734 by Donne Hazel, LPN Outcome: Progressing   Problem: Activity: Goal: Will verbalize the importance of balancing activity with adequate rest periods 04/11/2023 1046 by Donne Hazel, LPN Outcome: Adequate for Discharge 04/11/2023 0734 by Donne Hazel, LPN Outcome: Progressing Goal: Ability to tolerate increased activity will improve 04/11/2023 1046 by Donne Hazel, LPN Outcome: Adequate for Discharge 04/11/2023 0734 by Donne Hazel, LPN Outcome: Progressing   Problem: Coping: Goal: Ability to identify and utilize available resources and services will improve 04/11/2023 1046 by Donne Hazel, LPN Outcome: Adequate for Discharge 04/11/2023 0734 by Donne Hazel, LPN Outcome: Progressing   Problem: Life Cycle: Goal: Chance of risk for complications during the postpartum period will decrease 04/11/2023 1046 by Donne Hazel, LPN Outcome: Adequate for Discharge 04/11/2023 0734 by Donne Hazel, LPN Outcome: Progressing   Problem: Role Relationship: Goal: Ability to demonstrate positive interaction with newborn will improve 04/11/2023 1046 by Donne Hazel, LPN Outcome: Adequate for Discharge 04/11/2023 0734 by Donne Hazel, LPN Outcome: Progressing   Problem: Skin Integrity: Goal: Demonstration of wound healing without infection will improve 04/11/2023 1046 by Donne Hazel, LPN Outcome: Adequate for Discharge 04/11/2023 0734 by Donne Hazel, LPN Outcome: Progressing

## 2023-04-11 NOTE — Discharge Instructions (Addendum)
WHAT TO LOOK OUT FOR: Fever of 100.4 or above Mastitis: feels like flu and breasts hurt Infection: increased pain, swelling or redness Blood clots golf ball size or larger Postpartum depression   Congratulations on your newest addition!

## 2023-04-11 NOTE — Plan of Care (Signed)
  Problem: Education: Goal: Individualized Educational Video(s) Outcome: Progressing Goal: Individualized Newborn Educational Video(s) Outcome: Progressing   Problem: Activity: Goal: Will verbalize the importance of balancing activity with adequate rest periods Outcome: Progressing Goal: Ability to tolerate increased activity will improve Outcome: Progressing   Problem: Coping: Goal: Ability to identify and utilize available resources and services will improve Outcome: Progressing   Problem: Life Cycle: Goal: Chance of risk for complications during the postpartum period will decrease Outcome: Progressing   Problem: Role Relationship: Goal: Ability to demonstrate positive interaction with newborn will improve Outcome: Progressing   Problem: Skin Integrity: Goal: Demonstration of wound healing without infection will improve Outcome: Progressing

## 2023-04-15 ENCOUNTER — Inpatient Hospital Stay (HOSPITAL_COMMUNITY): Payer: BC Managed Care – PPO

## 2023-04-15 ENCOUNTER — Inpatient Hospital Stay (HOSPITAL_COMMUNITY): Admission: RE | Admit: 2023-04-15 | Payer: BC Managed Care – PPO | Source: Home / Self Care | Admitting: Family Medicine

## 2023-04-16 ENCOUNTER — Ambulatory Visit: Payer: BC Managed Care – PPO | Admitting: *Deleted

## 2023-04-16 VITALS — BP 109/73 | HR 76 | Ht 61.0 in | Wt 167.2 lb

## 2023-04-16 DIAGNOSIS — Z0130 Encounter for examination of blood pressure without abnormal findings: Secondary | ICD-10-CM

## 2023-04-16 DIAGNOSIS — O10919 Unspecified pre-existing hypertension complicating pregnancy, unspecified trimester: Secondary | ICD-10-CM

## 2023-04-16 NOTE — Progress Notes (Signed)
Subjective:  Nicole Keller is a 32 y.o. female here for BP check.   Hypertension ROS: no TIA's, no chest pain on exertion, no dyspnea on exertion, and no swelling of ankles.    Objective:  Ht 5\' 1"  (1.549 m)   Wt 167 lb 3.2 oz (75.8 kg)   LMP 07/09/2022 (Exact Date)   BMI 31.59 kg/m   Appearance alert, well appearing, and in no distress. General exam BP noted to be well controlled today in office.    Assessment:   Blood Pressure well controlled.   Plan:  Current treatment plan is effective, no change in therapy.  Pt is breast and bottle feeding. Concern for nipple soreness. Discussed signs of good latch for infant. Concern for decreased milk supply particularly in right breast. Discussed supply and demand concept for breastfeeding. Recommended putting baby to breast as often as possible and/ or pumping between feedings. Pt had been using compact breast pump apparatus but it is broken. Ordered breast pump. Pt denies sign postpartum depression/ baby blues, but does report some overwhelm. Has supportive partner. Partner will help with feeding and diapers sometimes at night but is working as well.

## 2023-04-20 ENCOUNTER — Ambulatory Visit: Payer: BC Managed Care – PPO | Admitting: Internal Medicine

## 2023-04-21 DIAGNOSIS — Z8249 Family history of ischemic heart disease and other diseases of the circulatory system: Secondary | ICD-10-CM | POA: Diagnosis not present

## 2023-04-21 DIAGNOSIS — O99280 Endocrine, nutritional and metabolic diseases complicating pregnancy, unspecified trimester: Secondary | ICD-10-CM | POA: Diagnosis not present

## 2023-04-21 DIAGNOSIS — R600 Localized edema: Secondary | ICD-10-CM | POA: Diagnosis not present

## 2023-04-21 DIAGNOSIS — O0990 Supervision of high risk pregnancy, unspecified, unspecified trimester: Secondary | ICD-10-CM | POA: Diagnosis not present

## 2023-05-05 ENCOUNTER — Telehealth (HOSPITAL_COMMUNITY): Payer: Self-pay | Admitting: *Deleted

## 2023-05-05 NOTE — Telephone Encounter (Signed)
05/05/2023  Name: Nicole Keller MRN: [id number] DOB: 05/31/1991  Reason for Call:  Transition of Care Hospital Discharge Call  Contact Status: Patient Contact Status: Message  Language assistant needed:          Follow-Up Questions:    Inocente Salles Postnatal Depression Scale:  In the Past 7 Days:    PHQ2-9 Depression Scale:     Discharge Follow-up:    Post-discharge interventions: NA  Maudry Diego, RN 05/05/2023 1300

## 2023-05-07 ENCOUNTER — Telehealth: Payer: Self-pay

## 2023-05-07 NOTE — Telephone Encounter (Signed)
Home visiting RN left VM stating patient is complaining of hemorrhoids PP and would like assistance with this. Is also complaining of headaches that are resolved with ibuprofen.   Chart review shows history of chronic htn, no meds. S/p vaginal birth on 04/09/23. Seen for BP check on 10/4. PP visit scheduled for 11/14. Will forward to correct office location for review.

## 2023-05-11 NOTE — Telephone Encounter (Signed)
Left message on vm for patient to return call to the office.

## 2023-05-11 NOTE — Telephone Encounter (Signed)
Discussed sx with patient. Patient has not tried anything for the hemorrhoids. States that sx have improved some, but are still present. Patient advised to try otc preparation h and tucks pads for relief.

## 2023-05-18 ENCOUNTER — Ambulatory Visit (INDEPENDENT_AMBULATORY_CARE_PROVIDER_SITE_OTHER): Payer: BC Managed Care – PPO | Admitting: Internal Medicine

## 2023-05-18 DIAGNOSIS — E0500 Thyrotoxicosis with diffuse goiter without thyrotoxic crisis or storm: Secondary | ICD-10-CM | POA: Diagnosis not present

## 2023-05-18 DIAGNOSIS — E890 Postprocedural hypothyroidism: Secondary | ICD-10-CM | POA: Diagnosis not present

## 2023-05-18 LAB — TSH: TSH: 0.16 u[IU]/mL — ABNORMAL LOW (ref 0.35–5.50)

## 2023-05-18 LAB — T4, FREE: Free T4: 1.14 ng/dL (ref 0.60–1.60)

## 2023-05-18 NOTE — Progress Notes (Unsigned)
Name: Nicole Keller  MRN/ DOB: [id number], 26-Apr-1991    Age/ Sex: 32 y.o., female     PCP: Grayce Sessions, NP   Reason for Endocrinology Evaluation: Graves' Disease/postablative hypothyroidism     Initial Endocrinology Clinic Visit: 03/30/2018    PATIENT IDENTIFIER: [name] is a 32 y.o., female with a past medical history of Graves' disease. She has followed with  Endocrinology clinic since 03/30/2018 for consultative assistance with management of her Luiz Blare' disease.   HISTORICAL SUMMARY: The patient was first diagnosed with hyperthyroidism secondary to Graves' disease in 2018.  She was on methimazole between 2018-2019.   She is s/p RAI with 17.4 mCi I-131 sodium iodide orally on 05/29/2018.  She was started on LT-for replacement shortly after. 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    She is S/P delivery 04/08/2023  No local neck swelling  Denies diarrhea but has occasional constipation  Denies palpitations  Denies tremors Not nursing    Levothyroxine 137 mcg daily   HISTORY:  Past Medical History:  Past Medical History:  Diagnosis Date   Depression    Depression with anxiety 10/12/2017   Depression with anxiety 10/12/2017   Essential hypertension 08/09/2018   Essential hypertension   Hypertension    Hyperthyroidism    Hypothyroid    Hypothyroidism 09/06/2018   Pap smear abnormality of cervix with LGSIL 12/07/2017   +HPV > colposcopy      Tachycardia 10/12/2017   Palpitations/tachycardia   Vaginal discharge 05/07/2020   Past Surgical History:  Past Surgical History:  Procedure Laterality Date   NO PAST SURGERIES     Social History:  reports that she has never smoked. She has never used smokeless tobacco. She reports that she does not currently use alcohol. She reports that she does not currently use  drugs. Family History:  Family History  Problem Relation Age of Onset   Hypertension Mother    Thyroid disease Mother    Hyperlipidemia Father    Thyroid disease Sister    Hypertension Maternal Grandmother    Diabetes Paternal Grandmother      HOME MEDICATIONS: Allergies as of 05/18/2023   No Known Allergies      Medication List        Accurate as of May 18, 2023  9:37 AM. If you have any questions, ask your nurse or doctor.          acetaminophen 325 MG tablet Commonly known as: Tylenol Take 2 tablets (650 mg total) by mouth every 4 (four) hours as needed (for pain scale < 4).   coconut oil Oil Apply 1 Application topically as needed.   famotidine 20 MG tablet Commonly known as: PEPCID Take 1 tablet (20 mg total) by mouth 2 (two) times daily.   ibuprofen 600 MG tablet Commonly known as: ADVIL Take 1 tablet (600 mg total) by mouth every 6 (six) hours as needed.   levothyroxine 137 MCG tablet Commonly known as: SYNTHROID Take 1 tablet (137 mcg total) by mouth as directed. 2 tabs on Sundays and Wednesdays and 1 tab rest of the week   multivitamin-prenatal 27-0.8 MG Tabs tablet Take 1 tablet by mouth daily at 12 noon.   senna-docusate 8.6-50 MG tablet Commonly known as: Senokot-S Take 2 tablets by mouth daily.   simethicone 80 MG chewable tablet Commonly known  as: MYLICON Chew 1 tablet (80 mg total) by mouth as needed for flatulence.   witch hazel-glycerin pad Commonly known as: TUCKS Apply 1 Application topically as needed for hemorrhoids.          OBJECTIVE:   PHYSICAL EXAM: VS: LMP 07/09/2022 (Exact Date)    EXAM: General: Pt appears well and is in NAD  Eyes: External eye exam normal without stare, lid lag or exophthalmos.  EOM intact.    Neck: General: Supple without adenopathy. Thyroid:  No goiter or nodules appreciated.  Lungs: Clear with good BS bilat with no rales, rhonchi, or wheezes  Heart: Auscultation: RRR.  Abdomen:  soft,  nontender  Extremities:  BL LE: No pretibial edema   Mental Status: Judgment, insight: Intact Orientation: Oriented to time, place, and person Mood and affect: No depression, anxiety, or agitation     DATA REVIEWED:  Latest Reference Range & Units 11/12/22 12:06  TSH 0.35 - 5.50 uIU/mL 1.85    ASSESSMENT / PLAN / RECOMMENDATIONS:   Postablative Hypothyroidism   -She is clinically euthroid  -TSH within normal range for second trimester, no changes at this time -Will repeat labs in 2 months   Medications   Continue levothyroxine 137 mcg , 2 tabs on Sundays and Wednesdays, 1 tablet the rest of the week    2. Graves' Disease:  - No extrathyroidal manifestations of Graves' Disease -Will check TRAb on next lab  F/U in 6 months  Labs in 8 weeks     Signed electronically by: Lyndle Herrlich, MD  [facility]  [facility] [address] [place], [place] [address] Phone: [phone] FAX: [phone]      CC: Grayce Sessions, NP [address] [place] [address] Phone: [phone]  Fax: [phone]   Return to Endocrinology clinic as below: Future Appointments  Date Time Provider Department Center  05/27/2023  3:10 PM Warden Fillers, MD [facility] None

## 2023-05-18 NOTE — Patient Instructions (Signed)

## 2023-05-19 ENCOUNTER — Telehealth: Payer: Self-pay | Admitting: Internal Medicine

## 2023-05-19 NOTE — Telephone Encounter (Signed)
Please let the patient know that her thyroid test shows that she is on too much levothyroxine, but I would suggest not to make any changes at this time and recheck it in 6 weeks because she was feeling fine.   Please schedule the patient for a lab appointment in 6 weeks, thanks

## 2023-05-20 NOTE — Telephone Encounter (Signed)
Patient advised and lab scheduled

## 2023-05-27 ENCOUNTER — Ambulatory Visit: Payer: BC Managed Care – PPO | Admitting: Obstetrics and Gynecology

## 2023-05-27 DIAGNOSIS — N870 Mild cervical dysplasia: Secondary | ICD-10-CM | POA: Diagnosis not present

## 2023-05-27 NOTE — Progress Notes (Signed)
..   Post Partum Visit Note  Nicole Keller is a 32 y.o. G32P3003 female who presents for a postpartum visit. She is 6 weeks postpartum following a normal spontaneous vaginal delivery.  I have fully reviewed the prenatal and intrapartum course. The delivery was at 40.0 gestational weeks.  Anesthesia: epidural. Postpartum course has been good. Baby is doing well. Baby is feeding by bottle - Similac Advance. Bleeding no bleeding. Bowel function is normal. Bladder function is normal. Patient is sexually active. Contraception method is condoms. Postpartum depression screening: negative.   The pregnancy intention screening data noted above was reviewed. Potential methods of contraception were discussed. The patient elected to proceed with FOB with vasectomy.   Edinburgh Postnatal Depression Scale - 05/27/23 1528       Edinburgh Postnatal Depression Scale:  In the Past 7 Days   I have been able to laugh and see the funny side of things. 0    I have looked forward with enjoyment to things. 0    I have blamed myself unnecessarily when things went wrong. 0    I have been anxious or worried for no good reason. 0    I have felt scared or panicky for no good reason. 0    Things have been getting on top of me. 0    I have been so unhappy that I have had difficulty sleeping. 0    I have felt sad or miserable. 0    I have been so unhappy that I have been crying. 0    The thought of harming myself has occurred to me. 0    Edinburgh Postnatal Depression Scale Total 0             Health Maintenance Due  Topic Date Due   COVID-19 Vaccine (3 - 2023-24 season) 03/14/2023    The following portions of the patient's history were reviewed and updated as appropriate: allergies, current medications, past family history, past medical history, past social history, past surgical history, and problem list.  Review of Systems Pertinent items are noted in HPI.  Objective:  Wt 163 lb 1.6 oz (74 kg)   LMP  07/09/2022 (Exact Date)   BMI 30.82 kg/m    General:  alert, cooperative, and no distress   Breasts:  not indicated  Lungs: clear to auscultation bilaterally  Heart:  regular rate and rhythm  Abdomen: soft, non-tender; bowel sounds normal; no masses,  no organomegaly   Wound :N/a  GU exam:  not indicated       Assessment:    Encounter for postpartum care  Normal postpartum exam.   Plan:   Essential components of care per ACOG recommendations:  1.  Mood and well being: Patient with negative depression screening today. Reviewed local resources for support.  - Patient tobacco use? No.   - hx of drug use? No.    2. Infant care and feeding:  -Patient currently breastmilk feeding? No.  -Social determinants of health (SDOH) reviewed in EPIC. No concens.  3. Sexuality, contraception and birth spacing - Patient does not want a pregnancy in the next year.  Desired family size is 3 children.  - Reviewed reproductive life planning. Reviewed contraceptive methods based on pt preferences and effectiveness.  Patient desired Vasectomy today.   - Discussed birth spacing of 18 months  4. Sleep and fatigue -Encouraged family/partner/community support of 4 hrs of uninterrupted sleep to help with mood and fatigue  5. Physical Recovery  - Discussed patients  delivery and complications. She describes her labor as good. - Patient had a Vaginal, no problems at delivery. Patient had   no  laceration. Perineal healing reviewed. Patient expressed understanding - Patient has urinary incontinence? No. - Patient is safe to resume physical and sexual activity  6.  Health Maintenance - HM due items addressed Yes - Last pap smear  Diagnosis  Date Value Ref Range Status  09/17/2022 - Low grade squamous intraepithelial lesion (LSIL) (A)  Final   Pap smear not done at today's visit. Pt advised of abnormal pap, but declined due to heavy menses.  She will reschedule in 2-3 weeks for pap smear. -Breast  Cancer screening indicated? No.   7. Chronic Disease/Pregnancy Condition follow up: None  - PCP follow up  Warden Fillers, MD [facility], [facility]

## 2023-06-14 ENCOUNTER — Encounter: Payer: Self-pay | Admitting: Family Medicine

## 2023-06-14 ENCOUNTER — Ambulatory Visit: Payer: BC Managed Care – PPO | Admitting: Family Medicine

## 2023-06-14 ENCOUNTER — Other Ambulatory Visit (HOSPITAL_COMMUNITY)
Admission: RE | Admit: 2023-06-14 | Discharge: 2023-06-14 | Disposition: A | Discharge status: Home / Self Care | Payer: BC Managed Care – PPO | Source: Ambulatory Visit | Attending: Family Medicine | Admitting: Family Medicine

## 2023-06-14 VITALS — BP 108/74 | HR 75 | Ht 61.0 in | Wt 163.8 lb

## 2023-06-14 DIAGNOSIS — N898 Other specified noninflammatory disorders of vagina: Secondary | ICD-10-CM | POA: Diagnosis not present

## 2023-06-14 DIAGNOSIS — N760 Acute vaginitis: Secondary | ICD-10-CM | POA: Diagnosis not present

## 2023-06-14 DIAGNOSIS — B9689 Other specified bacterial agents as the cause of diseases classified elsewhere: Secondary | ICD-10-CM | POA: Diagnosis not present

## 2023-06-14 DIAGNOSIS — R87612 Low grade squamous intraepithelial lesion on cytologic smear of cervix (LGSIL): Secondary | ICD-10-CM | POA: Insufficient documentation

## 2023-06-14 DIAGNOSIS — Z01419 Encounter for gynecological examination (general) (routine) without abnormal findings: Secondary | ICD-10-CM

## 2023-06-14 DIAGNOSIS — Z124 Encounter for screening for malignant neoplasm of cervix: Secondary | ICD-10-CM

## 2023-06-14 NOTE — Addendum Note (Signed)
Addended by: Jearld Adjutant on: 06/14/2023 10:04 AM   Modules accepted: Orders

## 2023-06-14 NOTE — Progress Notes (Signed)
ANNUAL EXAM Patient name: Nicole Keller MRN [id number]  Date of birth: 1990-08-16 Chief Complaint:   No chief complaint on file.  History of Present Illness:   Nicole Keller is a 32 y.o. G66P3003 female being seen today for a routine annual exam.  Current complaints: none. Doing well PP.   Patient's last menstrual period was 05/25/2023 (exact date).   The pregnancy intention screening data noted above was reviewed. Potential methods of contraception were discussed. The patient elected to proceed with No data recorded.   Last pap 09/2022. Results were: LSIL w/ HRHPV negative. 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 Denies fhx Last colonoscopy: never. Results were: N/A. Family h/o colorectal cancer: no     03/31/2023    3:52 PM 09/17/2022    3:08 PM 09/03/2022    3:12 PM 06/24/2022    3:58 PM 11/05/2021    2:30 PM  Depression screen PHQ 2/9  Decreased Interest 0 0 0 0 0  Down, Depressed, Hopeless 0 0 0 0 0  PHQ - 2 Score 0 0 0 0 0  Altered sleeping 0 0 0  1  Tired, decreased energy 1 1 1  1   Change in appetite 1 0 0  0  Feeling bad or failure about yourself  0 0 0  0  Trouble concentrating 0 0 0  0  Moving slowly or fidgety/restless 0 0 0  0  Suicidal thoughts 0 0 0  0  PHQ-9 Score 2 1 1  2   Difficult doing work/chores Not difficult at all Not difficult at all           03/31/2023    3:54 PM 09/17/2022    3:22 PM 09/03/2022    3:14 PM 06/24/2022    3:58 PM  GAD 7 : Generalized Anxiety Score  Nervous, Anxious, on Edge 0 0 0 1  Control/stop worrying 1 1 1 1   Worry too much - different things 1 0 1 1  Trouble relaxing 0 0 1 1  Restless 0 0 0 1  Easily annoyed or irritable 1 0 0 1  Afraid - awful might happen 0 0 0 0  Total GAD 7 Score 3 1 3 6   Anxiety Difficulty Not difficult at all Not difficult at all        Review of Systems:   Pertinent items are noted in HPI Denies any headaches, blurred vision, fatigue, shortness of breath, chest pain, abdominal pain, abnormal vaginal discharge/itching/odor/irritation, problems with periods, bowel movements, urination, or intercourse unless otherwise stated above. Pertinent History Reviewed:  Reviewed past medical,surgical, social and family history.  Reviewed problem list, medications and allergies. Physical Assessment:   Vitals:   06/14/23 0834  BP: 108/74  Pulse: 75  Weight: 163 lb 12.8 oz (74.3 kg)  Height: 5\' 1"  (1.549 m)  Body mass index is 30.95 kg/m.        Physical Examination:   General appearance - well appearing, and in no distress  Mental status - alert, oriented to person, place, and time  Psych:  She has a normal mood and affect  Skin - warm and dry, normal color, no suspicious lesions noted  Chest - effort normal, all lung fields clear to auscultation bilaterally  Heart - normal rate and regular rhythm  Neck:  midline trachea,  no thyromegaly or nodules  Abdomen - soft, nontender, nondistended, no masses or organomegaly  Pelvic - VULVA: normal appearing vulva with no masses, tenderness or lesions  VAGINA: normal appearing vagina with normal color and discharge, no lesions  CERVIX: very anterior; normal appearing cervix without discharge or lesions  Thin prep pap is done with HR HPV cotesting  Extremities:  No swelling or varicosities noted  Chaperone present for exam  No results found for this or any previous visit (from the past 24 hour(s)).  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   Meds: No orders of the defined types were placed in this encounter.   Follow-up:  Return in about 1 year (around 06/13/2024) for Yearly wellness.  Sundra Aland, MD 06/14/2023 9:35 AM

## 2023-06-14 NOTE — Progress Notes (Addendum)
Pt presents for repeat PAP. No concerns

## 2023-06-15 ENCOUNTER — Other Ambulatory Visit: Payer: BC Managed Care – PPO

## 2023-06-16 ENCOUNTER — Other Ambulatory Visit: Payer: Self-pay | Admitting: *Deleted

## 2023-06-16 LAB — CERVICOVAGINAL ANCILLARY ONLY
Bacterial Vaginitis (gardnerella): POSITIVE — AB
Candida Glabrata: NEGATIVE
Candida Vaginitis: NEGATIVE
Chlamydia: NEGATIVE
Comment: NEGATIVE
Comment: NEGATIVE
Comment: NEGATIVE
Comment: NEGATIVE
Comment: NEGATIVE
Comment: NORMAL
Neisseria Gonorrhea: NEGATIVE
Trichomonas: NEGATIVE

## 2023-06-16 LAB — CYTOLOGY - PAP
Chlamydia: NEGATIVE
Comment: NEGATIVE
Comment: NEGATIVE
Comment: NEGATIVE
Comment: NORMAL
High risk HPV: POSITIVE — AB
Neisseria Gonorrhea: NEGATIVE
Trichomonas: NEGATIVE

## 2023-06-16 MED ORDER — METRONIDAZOLE 500 MG PO TABS: 500.0000 mg | ORAL_TABLET | Freq: Two times a day (BID) | ORAL | 0 refills | Status: DC

## 2023-06-16 NOTE — Progress Notes (Signed)
Flagyl sent for +BV °See lab result °

## 2023-06-17 ENCOUNTER — Other Ambulatory Visit: Payer: BC Managed Care – PPO

## 2023-06-17 ENCOUNTER — Other Ambulatory Visit: Payer: Self-pay

## 2023-06-17 DIAGNOSIS — E890 Postprocedural hypothyroidism: Secondary | ICD-10-CM

## 2023-06-18 LAB — TSH: TSH: 1.04 m[IU]/L

## 2023-06-18 LAB — T4, FREE: Free T4: 1.3 ng/dL (ref 0.8–1.8)

## 2023-06-19 NOTE — Progress Notes (Signed)
Pt w ASCUS on 2023 pap, LSIL on pap x 2 now. Would schedule for repeat colpo given repeat tests of >= ASCUS. Will send to clinical team to relay results and help schedule colpo. Pt also with BV, will send in Flagyl.

## 2023-06-21 ENCOUNTER — Telehealth: Payer: Self-pay | Admitting: Internal Medicine

## 2023-06-21 MED ORDER — LEVOTHYROXINE SODIUM 137 MCG PO TABS: 137.0000 ug | ORAL_TABLET | ORAL | 3 refills | Status: DC

## 2023-06-21 NOTE — Telephone Encounter (Signed)
Patient advising that her Levothyroxine Rx is expired please send refill to [address] [facility] [place] [facility]. Please advise patient when called in

## 2023-06-21 NOTE — Telephone Encounter (Signed)
Done

## 2023-07-01 ENCOUNTER — Other Ambulatory Visit: Payer: BC Managed Care – PPO

## 2023-08-12 ENCOUNTER — Other Ambulatory Visit (HOSPITAL_COMMUNITY)
Admission: RE | Admit: 2023-08-12 | Discharge: 2023-08-12 | Disposition: A | Discharge status: Home / Self Care | Payer: BC Managed Care – PPO | Source: Ambulatory Visit | Attending: Obstetrics & Gynecology | Admitting: Obstetrics & Gynecology

## 2023-08-12 ENCOUNTER — Ambulatory Visit: Payer: BC Managed Care – PPO | Admitting: Obstetrics & Gynecology

## 2023-08-12 VITALS — BP 115/74 | HR 72 | Ht 61.0 in | Wt 168.5 lb

## 2023-08-12 DIAGNOSIS — R87612 Low grade squamous intraepithelial lesion on cytologic smear of cervix (LGSIL): Secondary | ICD-10-CM | POA: Insufficient documentation

## 2023-08-12 DIAGNOSIS — B977 Papillomavirus as the cause of diseases classified elsewhere: Secondary | ICD-10-CM | POA: Diagnosis not present

## 2023-08-12 DIAGNOSIS — Z23 Encounter for immunization: Secondary | ICD-10-CM | POA: Diagnosis not present

## 2023-08-12 LAB — POCT URINE PREGNANCY: Preg Test, Ur: NEGATIVE

## 2023-08-12 NOTE — Progress Notes (Signed)
Pt. Presents for colpo.

## 2023-08-12 NOTE — Addendum Note (Signed)
Addended by: Salome Holmes on: 08/12/2023 10:24 AM   Modules accepted: Orders

## 2023-08-12 NOTE — Progress Notes (Signed)
    GYNECOLOGY PROGRESS NOTE  Subjective:    Patient ID: Nicole Keller, female    DOB: 06-19-1991, 33 y.o.   MRN: [id number]  HPI  Patient is a 33 y.o. G21P3003 female who presents for  a colposcopy. Her pap 06/2023 was LGSIL with + HR HPV.  Her pap history is as follows:  09/2022    LSIL w/ HRHPV negative. 2023- ASCUS in past with neg HPV 07/2021 - colpo in 12/2017 -- ECC with  squamous cells with koilocytic atypia (2/2 HPV effect) 2019- LSIL with pos HPV (not typed)   The following portions of the patient's history were reviewed and updated as appropriate: allergies, current medications, past family history, past medical history, past social history, past surgical history, and problem list.  Review of Systems Pertinent items are noted in HPI.   Objective:   Blood pressure 115/74, pulse 72, height 5\' 1"  (1.549 m), weight 168 lb 8 oz (76.4 kg), last menstrual period 06/24/2023, not currently breastfeeding. Body mass index is 31.84 kg/m. Well nourished, well hydrated Latina, no apparent distress  UPT negative, consent signed, time out done Speculum placed. Cervix prepped with acetic acid. Transformation zone seen in its entirety. Colpo adequate. Colposcopic findings normal. ECC obtained. She tolerated the procedure well.   Assessment:   1. LGSIL on Pap smear of cervix   2. High risk HPV infection      Plan:   1. LGSIL on Pap smear of cervix (Primary) - await pathology  - she will start Gardasil series  2. High risk HPV infection  3. Contraception- she is currently using condoms. We discussed options. She will consider her options and schedule appt prn

## 2023-08-16 ENCOUNTER — Encounter: Payer: Self-pay | Admitting: Obstetrics & Gynecology

## 2023-08-16 LAB — SURGICAL PATHOLOGY

## 2023-08-25 ENCOUNTER — Telehealth: Payer: Self-pay

## 2023-08-25 NOTE — Telephone Encounter (Signed)
Returned call, pt had questions about partner experiencing itching on his penis after intercourse, pt states that she will have her partner follow up with his provider.

## 2023-10-11 ENCOUNTER — Ambulatory Visit: Payer: BC Managed Care – PPO

## 2023-10-11 VITALS — BP 123/80 | HR 80

## 2023-10-11 DIAGNOSIS — Z23 Encounter for immunization: Secondary | ICD-10-CM

## 2023-10-11 NOTE — Progress Notes (Signed)
 Nicole Keller is here for their 2nd Gardasil injection. Pt denies any issues at this time. Pt tolerated injection well. To follow up in 4 months for next injection.

## 2023-10-28 ENCOUNTER — Ambulatory Visit
Admission: EM | Admit: 2023-10-28 | Discharge: 2023-10-28 | Disposition: A | Discharge status: Home / Self Care | Attending: Family Medicine | Admitting: Family Medicine

## 2023-10-28 ENCOUNTER — Encounter: Payer: Self-pay | Admitting: Emergency Medicine

## 2023-10-28 DIAGNOSIS — Z3201 Encounter for pregnancy test, result positive: Secondary | ICD-10-CM

## 2023-10-28 LAB — POCT URINALYSIS DIP (MANUAL ENTRY)
Bilirubin, UA: NEGATIVE
Blood, UA: NEGATIVE
Glucose, UA: NEGATIVE mg/dL
Ketones, POC UA: NEGATIVE mg/dL
Leukocytes, UA: NEGATIVE
Nitrite, UA: NEGATIVE
Protein Ur, POC: NEGATIVE mg/dL
Spec Grav, UA: 1.02 (ref 1.010–1.025)
Urobilinogen, UA: 0.2 U/dL
pH, UA: 5.5 (ref 5.0–8.0)

## 2023-10-28 LAB — POCT URINE PREGNANCY: Preg Test, Ur: POSITIVE — AB

## 2023-10-28 NOTE — ED Triage Notes (Addendum)
 Pt presents for pregnancy testing. States she took 3 test at home today and saw faint line.  Pt not planning to become pregnant

## 2023-10-28 NOTE — ED Provider Notes (Signed)
 Nicole Keller    CSN: [id number] Arrival date & time: 10/28/23  1625      History   Chief Complaint Chief Complaint  Patient presents with   Possible Pregnancy    HPI Nicole Keller is a 33 y.o. female.   Nicole Keller is a 33 y.o. female presenting for chief complaint of Possible Pregnancy. She has had 3 positive pregnancy tests at home with faint lines. She is here to confirm pregnancy.  Last menstrual cycle September 24, 2023.  Menstrual cycles are regular.  Last day of unprotected sexual intercourse was March 27.  She denies abdominal pain, low back pain, dizziness, urinary symptoms, vaginal bleeding, vaginal discharge/odor/itching, fever, chills, nausea, vomiting, or diarrhea. She was recently pregnant and her baby is 30 months old.    Possible Pregnancy    Past Medical History:  Diagnosis Date   Depression    Depression with anxiety 10/12/2017   Depression with anxiety 10/12/2017   Essential hypertension 08/09/2018   Essential hypertension   Hypertension    Hyperthyroidism    Hypothyroid    Hypothyroidism 09/06/2018   Pap smear abnormality of cervix with LGSIL 12/07/2017   +HPV > colposcopy      Tachycardia 10/12/2017   Palpitations/tachycardia   Vaginal discharge 05/07/2020    Patient Active Problem List   Diagnosis Date Noted   NSVD (normal spontaneous vaginal delivery) 04/11/2023   PROM (premature rupture of membranes) 04/09/2023   Pregnancy related carpal tunnel syndrome in third trimester 03/18/2023   History of radioactive iodine thyroid ablation 12/24/2022   Large for gestational age fetus affecting management of mother 12/24/2022   Rubella non-immune status, antepartum 09/04/2022   Supervision of other normal pregnancy, antepartum 09/03/2022   Graves disease 04/22/2022   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 Authorizing Provider  acetaminophen (TYLENOL) 325 MG tablet Take 2 tablets (650 mg total) by mouth every 4 (four) hours as needed (for pain scale < 4). Patient not taking: Reported on 05/27/2023 04/11/23   Leveque, Alyssa, MD  coconut oil OIL Apply 1 Application topically as needed. Patient not taking: Reported on 05/27/2023 04/11/23   Leveque, Alyssa, MD  famotidine (PEPCID) 20 MG tablet Take 1 tablet (20 mg total) by mouth 2 (two) times daily. Patient not taking: Reported on 05/27/2023 03/02/23   Ervin, Michael L, MD  ibuprofen (ADVIL) 600 MG tablet Take 1 tablet (600 mg total) by mouth every 6 (six) hours as needed. Patient not taking: Reported on 05/27/2023 04/11/23   Raynell Caller, MD  levothyroxine (SYNTHROID) 137 MCG tablet Take 1 tablet (137 mcg total) by mouth as directed. 2 tabs on Sundays and Wednesdays and 1 tab rest  of the week 06/21/23   Shamleffer, Konrad Dolores, MD  metroNIDAZOLE (FLAGYL) 500 MG tablet Take 1 tablet (500 mg total) by mouth 2 (two) times daily. Patient not taking: Reported on 10/11/2023 06/16/23   Adam Phenix, MD  Prenatal Vit-Fe Fumarate-FA (MULTIVITAMIN-PRENATAL) 27-0.8 MG TABS tablet Take 1 tablet by mouth daily at 12 noon. Patient not taking: Reported on 10/11/2023    [provider]  senna-docusate (SENOKOT-S) 8.6-50 MG tablet Take 2 tablets by mouth daily. Patient not taking: Reported on 05/27/2023 04/11/23   Wyn Forster, MD  simethicone (MYLICON) 80 MG chewable tablet Chew 1 tablet (80 mg total) by mouth as needed for flatulence. Patient not taking: Reported on 05/27/2023  04/11/23   Wyn Forster, MD  witch hazel-glycerin (TUCKS) pad Apply 1 Application topically as needed for hemorrhoids. Patient not taking: Reported on 05/27/2023 04/11/23   Wyn Forster, MD    Family History Family History  Problem Relation Age of Onset   Hypertension Mother    Thyroid disease Mother    Hyperlipidemia Father    Thyroid disease Sister    Hypertension Maternal Grandmother    Diabetes Paternal Grandmother     Social History Social History   Tobacco Use   Smoking status: Never   Smokeless tobacco: Never  Vaping Use   Vaping status: Never Used  Substance Use Topics   Alcohol use: Not Currently    Comment: occ, not since confirmed pregnancy   Drug use: Not Currently     Allergies   Patient has no known allergies.   Review of Systems Review of Systems Per HPI  Physical Exam Triage Vital Signs ED Triage Vitals  Encounter Vitals Group     BP 10/28/23 1631 124/85     Systolic BP Percentile --      Diastolic BP Percentile --      Pulse Rate 10/28/23 1631 71     Resp 10/28/23 1631 17     Temp 10/28/23 1631 98.9 F (37.2 C)     Temp Source 10/28/23 1631 Oral     SpO2 10/28/23 1631 98 %     Weight --      Height --      Head Circumference --      Peak Flow --      Pain Score 10/28/23 1635 0     Pain Loc --      Pain Education --      Exclude from Growth Chart --    No data found.  Updated Vital Signs BP 124/85 (BP Location: Right Arm)   Pulse 71   Temp 98.9 F (37.2 C) (Oral)   Resp 17   LMP 09/24/2023 (Approximate)   SpO2 98%   Visual Acuity Right Eye Distance:   Left Eye Distance:   Bilateral Distance:    Right Eye Near:   Left Eye Near:    Bilateral Near:     Physical Exam Vitals and nursing note reviewed.  Constitutional:      Appearance: She is not ill-appearing or toxic-appearing.  HENT:     Head: Normocephalic and atraumatic.     Right Ear: Hearing and external ear normal.     Left Ear: Hearing and external ear  normal.     Nose: Nose normal.     Mouth/Throat:     Lips: Pink.  Eyes:     General: Lids are normal. Vision grossly intact. Gaze aligned appropriately.     Extraocular Movements: Extraocular movements intact.  Conjunctiva/sclera: Conjunctivae normal.  Pulmonary:     Effort: Pulmonary effort is normal.  Musculoskeletal:     Cervical back: Neck supple.  Skin:    General: Skin is warm and dry.     Capillary Refill: Capillary refill takes less than 2 seconds.     Findings: No rash.  Neurological:     General: No focal deficit present.     Mental Status: She is alert and oriented to person, place, and time. Mental status is at baseline.     Cranial Nerves: No dysarthria or facial asymmetry.  Psychiatric:        Mood and Affect: Mood normal.        Speech: Speech normal.        Behavior: Behavior normal.        Thought Content: Thought content normal.        Judgment: Judgment normal.      Keller Treatments / Results  Labs (all labs ordered are listed, but only abnormal results are displayed) Labs Reviewed  POCT URINE PREGNANCY - Abnormal; Notable for the following components:      Result Value   Preg Test, Ur Positive (*)    All other components within normal limits  POCT URINALYSIS DIP (MANUAL ENTRY)    EKG   Radiology No results found.  Procedures Procedures (including critical care time)  Medications Ordered in Keller Medications - No data to display  Initial Impression / Assessment and Plan / Keller Course  I have reviewed the triage vital signs and the nursing notes.  Pertinent labs & imaging results that were available during my care of the patient were reviewed by me and considered in my medical decision making (see chart for details).   1. Positive pregnancy test Urine pregnancy test is positive in the clinic.   Prenatal vitamin daily, stop ibuprofen, may take tylenol for pain instead.  Screening urinalysis is unremarkable for signs of asymptomatic bacteruria,  therefore deferred urine culture.  Given list of safe OTC medications in pregnancy.  OB/GYN provider list given, advised to schedule appointment for prenatal care MAU precautions given, no current red flag signs of pregnancy related emergency.    Counseled patient on potential for adverse effects with medications prescribed/recommended today, strict ER and return-to-clinic precautions discussed, patient verbalized understanding.    Final Clinical Impressions(s) / Keller Diagnoses   Final diagnoses:  Positive pregnancy test     Discharge Instructions      Your pregnancy test is positive.  - Refer to the list of safe medications in pregnancy you were given at urgent care today and do not take any other medications over-the-counter other than what is on this list.  You may only take Tylenol for pain and discomfort/fever.  Do not take ibuprofen in pregnancy.  - Drink plenty of water (at least 8 cups/day) to stay well-hydrated.  - Purchase a prenatal vitamin over the counter and take this once daily.  - Avoid smoking cigarettes, marijuana, and other drug use. Do not drink alcohol while pregnant.  If you develop any severe abdominal pain, severe low back pain, vaginal bleeding, or any other pregnancy related emergency, please go to the maternity assessment unit located at entrance [facility] of [facility].  Call the OB/GYN listed below to schedule an appointment to establish care so that you can be monitored throughout your pregnancy.   [facility]  [address] [place], [place] [address] [phone]   [facility]                                                            [address], [address], [place], [place], [address] [phone]    ED Prescriptions   None    PDMP not reviewed this encounter.   Starlene Eaton, [place] 10/28/23 1743

## 2023-10-28 NOTE — Discharge Instructions (Signed)
Your pregnancy test is positive.  - Refer to the list of safe medications in pregnancy you were given at urgent care today and do not take any other medications over-the-counter other than what is on this list.  You may only take Tylenol for pain and discomfort/fever.  Do not take ibuprofen in pregnancy.  - Drink plenty of water (at least 8 cups/day) to stay well-hydrated.  - Purchase a prenatal vitamin over the counter and take this once daily.  - Avoid smoking cigarettes, marijuana, and other drug use. Do not drink alcohol while pregnant.  If you develop any severe abdominal pain, severe low back pain, vaginal bleeding, or any other pregnancy related emergency, please go to the maternity assessment unit located at entrance C of [facility].  Call the OB/GYN listed below to schedule an appointment to establish care so that you can be monitored throughout your pregnancy.   [facility]            [address], [place], [place] [address] [phone]   [facility]                                                            [address], [address], [place], [place], [address] [phone]

## 2023-10-30 ENCOUNTER — Inpatient Hospital Stay (HOSPITAL_COMMUNITY)
Admission: AD | Admit: 2023-10-30 | Discharge: 2023-10-31 | Disposition: A | Discharge status: Home / Self Care | Attending: Obstetrics & Gynecology | Admitting: Obstetrics & Gynecology

## 2023-10-30 ENCOUNTER — Encounter (HOSPITAL_COMMUNITY): Payer: Self-pay

## 2023-10-30 DIAGNOSIS — O99611 Diseases of the digestive system complicating pregnancy, first trimester: Secondary | ICD-10-CM | POA: Diagnosis not present

## 2023-10-30 DIAGNOSIS — O3680X0 Pregnancy with inconclusive fetal viability, not applicable or unspecified: Secondary | ICD-10-CM | POA: Diagnosis not present

## 2023-10-30 DIAGNOSIS — Z3A00 Weeks of gestation of pregnancy not specified: Secondary | ICD-10-CM | POA: Diagnosis not present

## 2023-10-30 DIAGNOSIS — Z3201 Encounter for pregnancy test, result positive: Secondary | ICD-10-CM

## 2023-10-30 DIAGNOSIS — Z3A01 Less than 8 weeks gestation of pregnancy: Secondary | ICD-10-CM | POA: Insufficient documentation

## 2023-10-30 DIAGNOSIS — O209 Hemorrhage in early pregnancy, unspecified: Secondary | ICD-10-CM | POA: Insufficient documentation

## 2023-10-30 DIAGNOSIS — R7989 Other specified abnormal findings of blood chemistry: Secondary | ICD-10-CM | POA: Diagnosis not present

## 2023-10-30 DIAGNOSIS — N939 Abnormal uterine and vaginal bleeding, unspecified: Secondary | ICD-10-CM

## 2023-10-30 DIAGNOSIS — O219 Vomiting of pregnancy, unspecified: Secondary | ICD-10-CM | POA: Diagnosis not present

## 2023-10-30 DIAGNOSIS — A084 Viral intestinal infection, unspecified: Secondary | ICD-10-CM | POA: Insufficient documentation

## 2023-10-30 LAB — URINALYSIS, ROUTINE W REFLEX MICROSCOPIC
Bilirubin Urine: NEGATIVE
Glucose, UA: NEGATIVE mg/dL
Ketones, ur: NEGATIVE mg/dL
Leukocytes,Ua: NEGATIVE
Nitrite: NEGATIVE
Protein, ur: 30 mg/dL — AB
RBC / HPF: >50 RBC/hpf (ref 0–5)
Specific Gravity, Urine: 1.028 (ref 1.005–1.030)
pH: 5 (ref 5.0–8.0)

## 2023-10-30 LAB — WET PREP, GENITAL
Clue Cells Wet Prep HPF POC: NONE SEEN
Sperm: NONE SEEN
Trich, Wet Prep: NONE SEEN
WBC, Wet Prep HPF POC: <10 (ref <10)
Yeast Wet Prep HPF POC: NONE SEEN

## 2023-10-30 MED ORDER — ONDANSETRON HCL 4 MG/2ML IJ SOLN
4.0000 mg | Freq: Once | INTRAMUSCULAR | Status: AC
  Administered 2023-10-30: 4 mg via INTRAVENOUS
  Filled 2023-10-30: qty 2

## 2023-10-30 MED ORDER — LACTATED RINGERS IV BOLUS
1000.0000 mL | Freq: Once | INTRAVENOUS | Status: AC
  Administered 2023-10-30: 1000 mL via INTRAVENOUS

## 2023-10-30 MED ORDER — FAMOTIDINE IN NACL 20-0.9 MG/50ML-% IV SOLN
20.0000 mg | Freq: Once | INTRAVENOUS | Status: AC
  Administered 2023-10-30: 20 mg via INTRAVENOUS
  Filled 2023-10-30: qty 50

## 2023-10-30 MED ORDER — LOPERAMIDE HCL 2 MG PO CAPS: 2.0000 mg | ORAL_CAPSULE | Freq: Once | ORAL | Status: DC

## 2023-10-30 MED ORDER — ONDANSETRON 4 MG PO TBDP
4.0000 mg | ORAL_TABLET | Freq: Once | ORAL | Status: DC
Start: 2023-10-30 — End: 2023-10-30

## 2023-10-30 NOTE — MAU Note (Signed)
.  Nicole Keller is a 33 y.o. at [redacted]w[redacted]d here in MAU reporting: this am went to [facility] with family. Family having nausea vomiting and diarrhea. Pt began to have same symptoms this after noon. Symptoms include:nausea, vomiting, diarrhea, abdominal pain(not uterine)-Weakness Also vaginal bleeding that began on arrival to hospital  LMP: 09/24/2023 Onset of complaint: 1900 NV                                  2130 vaginal bleeding Pain score: None Vitals:   10/30/23 2242  BP: 130/78  Pulse: 90  Resp: 17  Temp: 98.2 F (36.8 C)  SpO2: 99%

## 2023-10-31 ENCOUNTER — Inpatient Hospital Stay (HOSPITAL_COMMUNITY)

## 2023-10-31 DIAGNOSIS — O3680X0 Pregnancy with inconclusive fetal viability, not applicable or unspecified: Secondary | ICD-10-CM | POA: Diagnosis not present

## 2023-10-31 DIAGNOSIS — R7989 Other specified abnormal findings of blood chemistry: Secondary | ICD-10-CM

## 2023-10-31 DIAGNOSIS — Z3201 Encounter for pregnancy test, result positive: Secondary | ICD-10-CM

## 2023-10-31 DIAGNOSIS — N939 Abnormal uterine and vaginal bleeding, unspecified: Secondary | ICD-10-CM | POA: Diagnosis not present

## 2023-10-31 DIAGNOSIS — A084 Viral intestinal infection, unspecified: Secondary | ICD-10-CM

## 2023-10-31 DIAGNOSIS — Z3A00 Weeks of gestation of pregnancy not specified: Secondary | ICD-10-CM | POA: Diagnosis not present

## 2023-10-31 LAB — COMPREHENSIVE METABOLIC PANEL WITH GFR
ALT: 22 U/L (ref 0–44)
AST: 23 U/L (ref 15–41)
Albumin: 4 g/dL (ref 3.5–5.0)
Alkaline Phosphatase: 48 U/L (ref 38–126)
Anion gap: 9 (ref 5–15)
BUN: 10 mg/dL (ref 6–20)
CO2: 23 mmol/L (ref 22–32)
Calcium: 8.8 mg/dL — ABNORMAL LOW (ref 8.9–10.3)
Chloride: 108 mmol/L (ref 98–111)
Creatinine, Ser: 0.74 mg/dL (ref 0.44–1.00)
GFR, Estimated: >60 mL/min (ref >60)
Glucose, Bld: 106 mg/dL — ABNORMAL HIGH (ref 70–99)
Potassium: 3.9 mmol/L (ref 3.5–5.1)
Sodium: 140 mmol/L (ref 135–145)
Total Bilirubin: 0.8 mg/dL (ref 0.0–1.2)
Total Protein: 7.2 g/dL (ref 6.5–8.1)

## 2023-10-31 LAB — CBC
HCT: 45.9 % (ref 36.0–46.0)
Hemoglobin: 15.7 g/dL — ABNORMAL HIGH (ref 12.0–15.0)
MCH: 30.7 pg (ref 26.0–34.0)
MCHC: 34.2 g/dL (ref 30.0–36.0)
MCV: 89.6 fL (ref 80.0–100.0)
Platelets: 276 10*3/uL (ref 150–400)
RBC: 5.12 MIL/uL — ABNORMAL HIGH (ref 3.87–5.11)
RDW: 13.5 % (ref 11.5–15.5)
WBC: 16.8 10*3/uL — ABNORMAL HIGH (ref 4.0–10.5)
nRBC: 0 % (ref 0.0–0.2)

## 2023-10-31 LAB — HCG, QUANTITATIVE, PREGNANCY: hCG, Beta Chain, Quant, S: 7 m[IU]/mL — ABNORMAL HIGH (ref <5)

## 2023-10-31 LAB — ABO/RH: ABO/RH(D): O POS

## 2023-10-31 NOTE — MAU Provider Note (Signed)
 History     CSN: [id number]  Arrival date and time: 10/30/23 2211   Event Date/Time   First Provider Initiated Contact with Patient 10/31/23 0001      Chief Complaint  Patient presents with   Emesis   Nausea   Vaginal Bleeding   Nicole Keller , a  33 y.o. [id number] at [redacted]w[redacted]d presents to MAU with complaints of nausea and vomiting. Patient states that she was out with her nieces and the children started to have nausea and vomiting after eating ice cream. She states that later that afternoon, she began to have nausea vomiting and diarrhea as well. She reports >7 episodes of vomiting with 2-3 episodes of diarrhea since arrival to MAU. Denies attempting to relieve symptoms.   She also reports some bright red vaginal bleeding but denies passing clots. She reports some light spotting with wiping but believes its because of the consistent retching. She denies passing clots, or saturating a pad. She reports a positive UPT a few days ago at Urgent Care.          OB History     Gravida  4   Para  3   Term  3   Preterm      AB      Living  3      SAB      IAB      Ectopic      Multiple  0   Live Births  3           Past Medical History:  Diagnosis Date   Depression    Depression with anxiety 10/12/2017   Depression with anxiety 10/12/2017   Essential hypertension 08/09/2018   Essential hypertension   Hypertension    Hyperthyroidism    Hypothyroid    Hypothyroidism 09/06/2018   Pap smear abnormality of cervix with LGSIL 12/07/2017   +HPV > colposcopy      Tachycardia 10/12/2017   Palpitations/tachycardia   Vaginal discharge 05/07/2020    Past Surgical History:  Procedure Laterality Date   NO PAST SURGERIES      Family History  Problem Relation Age of Onset   Hypertension Mother    Thyroid  disease Mother    Hyperlipidemia Father    Thyroid  disease Sister    Hypertension Maternal Grandmother    Diabetes Paternal Grandmother     Social History    Tobacco Use   Smoking status: Never   Smokeless tobacco: Never  Vaping Use   Vaping status: Never Used  Substance Use Topics   Alcohol use: Not Currently    Comment: occ, not since confirmed pregnancy   Drug use: Not Currently    Allergies: No Known Allergies  Medications Prior to Admission  Medication Sig Dispense Refill Last Dose/Taking   levothyroxine  (SYNTHROID ) 137 MCG tablet Take 1 tablet (137 mcg total) by mouth as directed. 2 tabs on Sundays and Wednesdays and 1 tab rest of the week 36 tablet 3 10/30/2023 Morning   Prenatal Vit-Fe Fumarate-FA (MULTIVITAMIN-PRENATAL) 27-0.8 MG TABS tablet Take 1 tablet by mouth daily at 12 noon.   10/30/2023 at  1:00 AM   acetaminophen  (TYLENOL ) 325 MG tablet Take 2 tablets (650 mg total) by mouth every 4 (four) hours as needed (for pain scale < 4). (Patient not taking: Reported on 05/27/2023) 60 tablet 0    coconut oil OIL Apply 1 Application topically as needed. (Patient not taking: Reported on 05/27/2023)      famotidine  (PEPCID ) 20 MG tablet  Take 1 tablet (20 mg total) by mouth 2 (two) times daily. (Patient not taking: Reported on 05/27/2023) 60 tablet 3    ibuprofen  (ADVIL ) 600 MG tablet Take 1 tablet (600 mg total) by mouth every 6 (six) hours as needed. (Patient not taking: Reported on 05/27/2023) 30 tablet 0    metroNIDAZOLE  (FLAGYL ) 500 MG tablet Take 1 tablet (500 mg total) by mouth 2 (two) times daily. (Patient not taking: Reported on 10/11/2023) 14 tablet 0    senna-docusate (SENOKOT-S) 8.6-50 MG tablet Take 2 tablets by mouth daily. (Patient not taking: Reported on 05/27/2023)      simethicone  (MYLICON) 80 MG chewable tablet Chew 1 tablet (80 mg total) by mouth as needed for flatulence. (Patient not taking: Reported on 05/27/2023)      witch hazel-glycerin  (TUCKS) pad Apply 1 Application topically as needed for hemorrhoids. (Patient not taking: Reported on 05/27/2023)       Review of Systems  Constitutional:  Negative for chills,  fatigue and fever.  Eyes:  Negative for pain and visual disturbance.  Respiratory:  Negative for apnea, shortness of breath and wheezing.   Cardiovascular:  Negative for chest pain and palpitations.  Gastrointestinal:  Positive for diarrhea, nausea and vomiting. Negative for abdominal pain and constipation.  Genitourinary:  Positive for vaginal bleeding. Negative for difficulty urinating, dysuria, pelvic pain, vaginal discharge and vaginal pain.  Musculoskeletal:  Negative for back pain.  Neurological:  Negative for seizures, weakness and headaches.  Psychiatric/Behavioral:  Negative for suicidal ideas.    Physical Exam   Blood pressure 130/78, pulse 90, temperature 98.2 F (36.8 C), temperature source Oral, resp. rate 17, last menstrual period 09/24/2023, SpO2 99%, not currently breastfeeding.  Physical Exam Vitals and nursing note reviewed. Exam conducted with a chaperone present.  Constitutional:      General: She is not in acute distress.    Appearance: Normal appearance. She is ill-appearing.  HENT:     Head: Normocephalic.  Cardiovascular:     Rate and Rhythm: Normal rate.  Pulmonary:     Effort: Pulmonary effort is normal.  Abdominal:     Palpations: Abdomen is soft.     Tenderness: There is no abdominal tenderness.  Genitourinary:    Comments: Light red vaginal bleeding noted on pad.  Musculoskeletal:        General: No swelling.     Cervical back: Normal range of motion.  Skin:    General: Skin is warm and dry.  Neurological:     Mental Status: She is alert and oriented to person, place, and time.  Psychiatric:        Mood and Affect: Mood normal.     MAU Course  Procedures Orders Placed This Encounter  Procedures   Wet prep, genital   Culture, OB Urine   US  OB LESS THAN 14 WEEKS WITH OB TRANSVAGINAL   CBC   hCG, quantitative, pregnancy   Comprehensive metabolic panel   Urinalysis, Routine w reflex microscopic -Urine, Clean Catch   ABO/Rh   Discharge  patient Discharge disposition: 01-Home or Self Care; Discharge patient date: 10/31/2023   Results for orders placed or performed during the hospital encounter of 10/30/23 (from the past 24 hours)  CBC     Status: Abnormal   Collection Time: 10/30/23 11:09 PM  Result Value Ref Range   WBC 16.8 (H) 4.0 - 10.5 K/uL   RBC 5.12 (H) 3.87 - 5.11 MIL/uL   Hemoglobin 15.7 (H) 12.0 - 15.0 g/dL  HCT 45.9 36.0 - 46.0 %   MCV 89.6 80.0 - 100.0 fL   MCH 30.7 26.0 - 34.0 pg   MCHC 34.2 30.0 - 36.0 g/dL   RDW 16.1 09.6 - 04.5 %   Platelets 276 150 - 400 K/uL   nRBC 0.0 0.0 - 0.2 %  ABO/Rh     Status: None   Collection Time: 10/30/23 11:09 PM  Result Value Ref Range   ABO/RH(D) O POS    No rh immune globuloin      NOT A RH IMMUNE GLOBULIN CANDIDATE, PT RH POSITIVE Performed at [facility], [address] [place], [place] [address]   hCG, quantitative, pregnancy     Status: Abnormal   Collection Time: 10/30/23 11:09 PM  Result Value Ref Range   hCG, Beta Chain, Quant, S 7 (H) <5 mIU/mL  Comprehensive metabolic panel     Status: Abnormal   Collection Time: 10/30/23 11:09 PM  Result Value Ref Range   Sodium 140 135 - 145 mmol/L   Potassium 3.9 3.5 - 5.1 mmol/L   Chloride 108 98 - 111 mmol/L   CO2 23 22 - 32 mmol/L   Glucose, Bld 106 (H) 70 - 99 mg/dL   BUN 10 6 - 20 mg/dL   Creatinine, Ser 1.91 0.44 - 1.00 mg/dL   Calcium 8.8 (L) 8.9 - 10.3 mg/dL   Total Protein 7.2 6.5 - 8.1 g/dL   Albumin 4.0 3.5 - 5.0 g/dL   AST 23 15 - 41 U/L   ALT 22 0 - 44 U/L   Alkaline Phosphatase 48 38 - 126 U/L   Total Bilirubin 0.8 0.0 - 1.2 mg/dL   GFR, Estimated >47 >82 mL/min   Anion gap 9 5 - 15  Wet prep, genital     Status: None   Collection Time: 10/30/23 11:21 PM  Result Value Ref Range   Yeast Wet Prep HPF POC NONE SEEN NONE SEEN   Trich, Wet Prep NONE SEEN NONE SEEN   Clue Cells Wet Prep HPF POC NONE SEEN NONE SEEN   WBC, Wet Prep HPF POC <10 <10   Sperm NONE SEEN   Urinalysis, Routine  w reflex microscopic -Urine, Clean Catch     Status: Abnormal   Collection Time: 10/30/23 11:25 PM  Result Value Ref Range   Color, Urine AMBER (A) YELLOW   APPearance CLOUDY (A) CLEAR   Specific Gravity, Urine 1.028 1.005 - 1.030   pH 5.0 5.0 - 8.0   Glucose, UA NEGATIVE NEGATIVE mg/dL   Hgb urine dipstick LARGE (A) NEGATIVE   Bilirubin Urine NEGATIVE NEGATIVE   Ketones, ur NEGATIVE NEGATIVE mg/dL   Protein, ur 30 (A) NEGATIVE mg/dL   Nitrite NEGATIVE NEGATIVE   Leukocytes,Ua NEGATIVE NEGATIVE   RBC / HPF >50 0 - 5 RBC/hpf   WBC, UA 0-5 0 - 5 WBC/hpf   Bacteria, UA FEW (A) NONE SEEN   Squamous Epithelial / HPF 11-20 0 - 5 /HPF   Mucus PRESENT     MDM - White count elevated. Concern for infection vs gastroenteritis   - HCG 7  - Wet prep normal  - IV fluids and antiemetics ordered - US  results revealed a thickened endometrial lining. With a low HCG concern for menstrual period.  - Recommended a repeat quant in 48 hours and patient agreeable to plan of care.  - plan for discharge.   Assessment and Plan   1. Viral gastroenteritis   2. Elevated serum  hCG   3. Positive urine pregnancy test   4. Vaginal bleeding    - Reviewed likely caused by viral gastroenteritis. Rx for Zofran  and Imodium  sent to outpatient pharmacy. Encouraged rest and PO hydration  - Reviewed recommended for repeat quant in 48 hours. Appt scheduled for [facility] on Tuesday.  - Reviewed worsening signs and return precautions.  - Patient discharged home in stable condition and may return to MAU as needed.    Corie Diamond, MSN CNM  10/31/2023, 12:01 AM

## 2023-11-01 LAB — GC/CHLAMYDIA PROBE AMP (~~LOC~~) NOT AT ARMC
Chlamydia: NEGATIVE
Comment: NEGATIVE
Comment: NORMAL
Neisseria Gonorrhea: NEGATIVE

## 2023-11-01 LAB — CULTURE, OB URINE

## 2023-11-02 ENCOUNTER — Other Ambulatory Visit

## 2023-11-02 DIAGNOSIS — Z3201 Encounter for pregnancy test, result positive: Secondary | ICD-10-CM

## 2023-11-03 ENCOUNTER — Telehealth: Payer: Self-pay

## 2023-11-03 LAB — BETA HCG QUANT (REF LAB): hCG Quant: 4 m[IU]/mL

## 2023-11-03 NOTE — Telephone Encounter (Signed)
 Return TC to pt, pt asking about HCG hormone being at a 4. Informed pt this is within the nonpregnant range. Pt informed me she has had bleeding, and passing of tissue which correlates with a miscarriage. Informed pt that if she feels as though she is heavy bleeding such as soaking through one pad or more an hour, or passing large clots that she should be evaluated in MAU. However, told pt that with a miscarriage bleeding and passing tissue is normal as this is the body's natural way of clearing the pregnancy out. Pt voiced understanding. Offered condolences to pt and warning signs to look out for.

## 2023-11-04 ENCOUNTER — Ambulatory Visit: Admitting: Physician Assistant

## 2023-11-10 ENCOUNTER — Ambulatory Visit (INDEPENDENT_AMBULATORY_CARE_PROVIDER_SITE_OTHER): Admitting: Primary Care

## 2023-11-16 ENCOUNTER — Ambulatory Visit: Payer: BC Managed Care – PPO | Admitting: Internal Medicine

## 2023-12-27 ENCOUNTER — Ambulatory Visit (INDEPENDENT_AMBULATORY_CARE_PROVIDER_SITE_OTHER): Admitting: Internal Medicine

## 2023-12-27 ENCOUNTER — Encounter: Payer: Self-pay | Admitting: Internal Medicine

## 2023-12-27 VITALS — BP 114/76 | HR 61 | Ht 61.0 in | Wt 168.0 lb

## 2023-12-27 DIAGNOSIS — E890 Postprocedural hypothyroidism: Secondary | ICD-10-CM | POA: Diagnosis not present

## 2023-12-27 DIAGNOSIS — E0500 Thyrotoxicosis with diffuse goiter without thyrotoxic crisis or storm: Secondary | ICD-10-CM | POA: Diagnosis not present

## 2023-12-27 LAB — TSH: TSH: 14.79 m[IU]/L — ABNORMAL HIGH

## 2023-12-27 LAB — T4, FREE: Free T4: 1 ng/dL (ref 0.8–1.8)

## 2023-12-27 NOTE — Progress Notes (Unsigned)
 Name: Nicole Keller  MRN/ DOB: [id number], [birth year]    Age/ Sex: 33 y.o., female     PCP: Marius Siemens, NP   Reason for Endocrinology Evaluation: Graves' Disease/postablative hypothyroidism     Initial Endocrinology Clinic Visit: 03/30/2018    PATIENT IDENTIFIER: Nicole Keller is a 33 y.o., female with a past medical history of Graves' disease. She has followed with [facility] since 03/30/2018 for consultative assistance with management of her Nicole Keller' disease.   HISTORICAL SUMMARY: The patient was first diagnosed with hyperthyroidism secondary to Graves' disease in 2018.  She was on methimazole  between 2018-2019.   She is s/p RAI with 17.4 mCi I-131 sodium iodide orally on 05/29/2018.  She was started on LT-for replacement shortly after. 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   Unfortunately, she had a recent miscarriage  No local neck swelling  She has difficulty to lose weight  Has left eye twitching, doesn't always sleep well at night   Continues with constipation  Denies palpitations  Has noted swelling around the hand area  Levothyroxine  137 mcg daily   HISTORY:  Past Medical History:  Past Medical History:  Diagnosis Date   Depression    Depression with anxiety 10/12/2017   Depression with anxiety 10/12/2017   Essential hypertension 08/09/2018   Essential hypertension   Hypertension    Hyperthyroidism    Hypothyroid    Hypothyroidism 09/06/2018   Pap smear abnormality of cervix with LGSIL 12/07/2017   +HPV > colposcopy      Tachycardia 10/12/2017   Palpitations/tachycardia   Vaginal discharge 05/07/2020   Past Surgical History:  Past Surgical History:  Procedure Laterality Date   NO PAST SURGERIES     Social History:  reports that she has never smoked. She has never  used smokeless tobacco. She reports that she does not currently use alcohol. She reports that she does not currently use drugs. Family History:  Family History  Problem Relation Age of Onset   Hypertension Mother    Thyroid  disease Mother    Hyperlipidemia Father    Thyroid  disease Sister    Hypertension Maternal Grandmother    Diabetes Paternal Grandmother      HOME MEDICATIONS: Allergies as of 12/27/2023   No Known Allergies      Medication List        Accurate as of December 27, 2023 12:01 PM. If you have any questions, ask your nurse or doctor.          acetaminophen  325 MG tablet Commonly known as: Tylenol  Take 2 tablets (650 mg total) by mouth every 4 (four) hours as needed (for pain scale < 4).   coconut oil Oil Apply 1 Application topically as needed.   famotidine  20 MG tablet Commonly known as: PEPCID  Take 1 tablet (20 mg total) by mouth 2 (two) times daily.   ibuprofen  600 MG tablet Commonly known as: ADVIL  Take 1 tablet (600 mg total) by mouth every 6 (six) hours as needed.   levothyroxine  137 MCG tablet Commonly known as: SYNTHROID  Take 1 tablet (137 mcg total) by mouth as directed. 2 tabs on Sundays and Wednesdays and 1 tab rest of the week   metroNIDAZOLE  500 MG tablet Commonly known as: FLAGYL  Take 1 tablet (500 mg total) by mouth 2 (two) times  daily.   multivitamin-prenatal 27-0.8 MG Tabs tablet Take 1 tablet by mouth daily at 12 noon.   senna-docusate 8.6-50 MG tablet Commonly known as: Senokot-S Take 2 tablets by mouth daily.   simethicone  80 MG chewable tablet Commonly known as: MYLICON Chew 1 tablet (80 mg total) by mouth as needed for flatulence.   witch hazel-glycerin  pad Commonly known as: TUCKS Apply 1 Application topically as needed for hemorrhoids.          OBJECTIVE:   PHYSICAL EXAM: VS: BP 114/76 (BP Location: Left Arm, Patient Position: Sitting, Cuff Size: Normal)   Pulse 61   Ht 5' 1 (1.549 m)   Wt 168 lb (76.2  kg)   LMP 09/24/2023 (Approximate)   SpO2 99%   BMI 31.74 kg/m     EXAM: General: Pt appears well and is in NAD  Neck: General: Supple without adenopathy. Thyroid :  No goiter or nodules appreciated.  Lungs: Clear with good BS bilat   Heart: Auscultation: RRR.  Extremities:  BL LE: No pretibial edema   Mental Status: Judgment, insight: Intact Orientation: Oriented to time, place, and person Mood and affect: No depression, anxiety, or agitation     DATA REVIEWED: ****  ASSESSMENT / PLAN / RECOMMENDATIONS:   Postablative Hypothyroidism   -She is complaining of weight gain and subjective hand swelling - Medications   Continue levothyroxine  137 mcg , daily    2. Graves' Disease:  - No extrathyroidal manifestations of Graves' Disease -Will check TRAb on next lab  F/U in 6 months      Signed electronically by: Natale Bail, MD  [facility] End[facility]  [facility] [address] [place] [place], [place] [address] Phone: [phone] FAX: [phone]      CC: Marius Siemens, NP [address] [place] [address] Phone: [phone]  Fax: [phone]   Return to Endocrinology clinic as below: Future Appointments  Date Time Provider Department Center  02/10/2024  8:20 AM [facility] NURSE [facility] None

## 2023-12-28 ENCOUNTER — Ambulatory Visit: Payer: Self-pay | Admitting: Internal Medicine

## 2023-12-28 DIAGNOSIS — E890 Postprocedural hypothyroidism: Secondary | ICD-10-CM

## 2023-12-28 MED ORDER — LEVOTHYROXINE SODIUM 137 MCG PO TABS: 137.0000 ug | ORAL_TABLET | ORAL | 3 refills | Status: DC

## 2024-01-04 ENCOUNTER — Ambulatory Visit (INDEPENDENT_AMBULATORY_CARE_PROVIDER_SITE_OTHER)

## 2024-01-04 VITALS — BP 117/76 | HR 75

## 2024-01-04 DIAGNOSIS — Z3480 Encounter for supervision of other normal pregnancy, unspecified trimester: Secondary | ICD-10-CM | POA: Insufficient documentation

## 2024-01-04 DIAGNOSIS — Z3201 Encounter for pregnancy test, result positive: Secondary | ICD-10-CM | POA: Diagnosis not present

## 2024-01-04 LAB — POCT URINE PREGNANCY: Preg Test, Ur: POSITIVE — AB

## 2024-01-04 NOTE — Progress Notes (Signed)
 Nicole Keller here for a UPT. Pt had a positive upt at home. LMP is 11/29/23.     UPT in office Positive.    Reviewed medications and informed to start a PNV, if not already. Pt to follow up in 3 weeks for New OB visit.

## 2024-01-24 ENCOUNTER — Ambulatory Visit (INDEPENDENT_AMBULATORY_CARE_PROVIDER_SITE_OTHER): Admitting: Internal Medicine

## 2024-01-24 ENCOUNTER — Encounter: Payer: Self-pay | Admitting: Internal Medicine

## 2024-01-24 VITALS — BP 110/66 | HR 68 | Ht 61.0 in | Wt 171.0 lb

## 2024-01-24 DIAGNOSIS — E890 Postprocedural hypothyroidism: Secondary | ICD-10-CM | POA: Diagnosis not present

## 2024-01-24 DIAGNOSIS — E0500 Thyrotoxicosis with diffuse goiter without thyrotoxic crisis or storm: Secondary | ICD-10-CM

## 2024-01-24 LAB — TSH: TSH: 1.18 m[IU]/L

## 2024-01-24 NOTE — Progress Notes (Unsigned)
 Name: Nicole Keller  MRN/ DOB: [id number], 04/18/1991    Age/ Sex: 33 y.o., female     PCP: Celestia Rosaline SQUIBB, NP   Reason for Endocrinology Evaluation: Graves' Keller/postablative hypothyroidism     Initial Endocrinology Clinic Visit: 03/30/2018    PATIENT IDENTIFIER: Nicole Keller is a 33 y.o., female with a past medical history of Graves' Keller. She has followed with [facility] since 03/30/2018 for consultative assistance with management of her Nicole Keller.   HISTORICAL SUMMARY: The patient was first diagnosed with hyperthyroidism secondary to Graves' Keller in 2018.  She was on methimazole  between 2018-2019.   She is s/p RAI with 17.4 mCi I-131 sodium iodide orally on 05/29/2018.  She was started on LT-for replacement shortly after. 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   LMP 11/29/2023 EDD 09/07/2024 Currently at 8 weeks    No local neck swelling  No recent changes in weight   Continues with constipation  Denies palpitations  She had an episode of tremors on Saturday  Denies eye symptoms     Levothyroxine  137 mcg, 2 tabs on Wednesdays and Sundays   HISTORY:  Past Medical History:  Past Medical History:  Diagnosis Date   Depression    Depression with anxiety 10/12/2017   Depression with anxiety 10/12/2017   Essential hypertension 08/09/2018   Essential hypertension   Hypertension    Hyperthyroidism    Hypothyroid    Hypothyroidism 09/06/2018   Pap smear abnormality of cervix with LGSIL 12/07/2017   +HPV > colposcopy      Tachycardia 10/12/2017   Palpitations/tachycardia   Vaginal discharge 05/07/2020   Past Surgical History:  Past Surgical History:  Procedure Laterality Date   NO PAST SURGERIES     Social History:  reports that she has never smoked. She has never  used smokeless tobacco. She reports that she does not currently use alcohol. She reports that she does not currently use drugs. Family History:  Family History  Problem Relation Age of Onset   Hypertension Mother    Thyroid  Keller Mother    Hyperlipidemia Father    Thyroid  Keller Sister    Hypertension Maternal Grandmother    Diabetes Paternal Grandmother      HOME MEDICATIONS: Allergies as of 01/24/2024   No Known Allergies      Medication List        Accurate as of January 24, 2024  7:43 AM. If you have any questions, ask your nurse or doctor.          acetaminophen  325 MG tablet Commonly known as: Tylenol  Take 2 tablets (650 mg total) by mouth every 4 (four) hours as needed (for pain scale < 4).   coconut oil Oil Apply 1 Application topically as needed.   famotidine  20 MG tablet Commonly known as: PEPCID  Take 1 tablet (20 mg total) by mouth 2 (two) times daily.   ibuprofen  600 MG tablet Commonly known as: ADVIL  Take 1 tablet (600 mg total) by mouth every 6 (six) hours as needed.   levothyroxine  137 MCG tablet Commonly known as: SYNTHROID  Take 1 tablet (137 mcg total) by mouth as directed. 2 tabs on Sundays and 1 tab Monday through Saturday What changed: additional instructions   metroNIDAZOLE  500 MG tablet Commonly known as: FLAGYL  Take 1 tablet (500  mg total) by mouth 2 (two) times daily.   multivitamin-prenatal 27-0.8 MG Tabs tablet Take 1 tablet by mouth daily at 12 noon.   senna-docusate 8.6-50 MG tablet Commonly known as: Senokot-S Take 2 tablets by mouth daily.   simethicone  80 MG chewable tablet Commonly known as: MYLICON Chew 1 tablet (80 mg total) by mouth as needed for flatulence.   witch hazel-glycerin  pad Commonly known as: TUCKS Apply 1 Application topically as needed for hemorrhoids.          OBJECTIVE:   PHYSICAL EXAM: VS: BP 110/66 (BP Location: Left Arm, Patient Position: Sitting, Cuff Size: Normal)   Pulse 68   Ht 5' 1  (1.549 m)   Wt 171 lb (77.6 kg)   LMP 11/29/2023   SpO2 99%   BMI 32.31 kg/m   Filed Weights   01/24/24 0730  Weight: 171 lb (77.6 kg)     EXAM: General: Pt appears well and is in NAD  Neck: General: Supple without adenopathy. Thyroid :  No goiter or nodules appreciated.  Lungs: Clear with good BS bilat   Heart: Auscultation: RRR.  Extremities:  BL LE: No pretibial edema   Mental Status: Judgment, insight: Intact Orientation: Oriented to time, place, and person Mood and affect: No depression, anxiety, or agitation     DATA REVIEWED:  ****   ASSESSMENT / PLAN / RECOMMENDATIONS:   Postablative Hypothyroidism   - Pt is clinically euthyroid  - Will recheck in 1 month    Medications   levothyroxine  137 mcg , 2 tabs on Sundays and 2 tabs on Wednesday     2. Graves' Keller:  - No extrathyroidal manifestations of Graves' Keller   F/U in 2 months      Signed electronically by: Stefano Redgie Butts, MD  [facility]ocr[facility]  [facility] [address] [place] [place], [place] [address] Phone: [phone] FAX: [phone]      CC: Celestia Rosaline SQUIBB, NP [address] [place] [address] Phone: [phone]  Fax: [phone]   Return to Endocrinology clinic as below: Future Appointments  Date Time Provider Department Center  02/03/2024  3:10 PM [facility] INTAKE [facility] None  02/10/2024  8:20 AM [facility] NURSE [facility] None  03/07/2024  2:45 PM [facility]UR[facility] None  07/14/2024  7:30 AM Mohini Heathcock, Donell Redgie, MD [facility] None

## 2024-01-27 ENCOUNTER — Ambulatory Visit: Payer: Self-pay | Admitting: Internal Medicine

## 2024-01-27 MED ORDER — LEVOTHYROXINE SODIUM 137 MCG PO TABS: 137.0000 ug | ORAL_TABLET | ORAL | 3 refills | Status: DC

## 2024-02-02 DIAGNOSIS — Z3A09 9 weeks gestation of pregnancy: Secondary | ICD-10-CM | POA: Diagnosis not present

## 2024-02-02 DIAGNOSIS — E0500 Thyrotoxicosis with diffuse goiter without thyrotoxic crisis or storm: Secondary | ICD-10-CM | POA: Diagnosis not present

## 2024-02-02 DIAGNOSIS — O209 Hemorrhage in early pregnancy, unspecified: Secondary | ICD-10-CM | POA: Diagnosis not present

## 2024-02-02 DIAGNOSIS — Z1330 Encounter for screening examination for mental health and behavioral disorders, unspecified: Secondary | ICD-10-CM | POA: Diagnosis not present

## 2024-02-02 NOTE — Progress Notes (Signed)
 Subjective: Patient is a 33 y.o. [id number] female   History of Present Illness The patient presents for evaluation of pregnancy.  She is currently pregnant, with her last menstrual period starting on 11/28/2023. She has been experiencing fatigue but reports no other symptoms such as nausea, vomiting, or breast tenderness. She noticed some spotting today, which has caused her some concern. She is currently taking prenatal vitamins and Synthroid . Her previous pregnancies were all delivered vaginally. She has an ultrasound scheduled for next week. She had a miscarriage in 11/2023 at [redacted] weeks gestation, confirmed by an urgent care clinic based on the date of her last period. Following the miscarriage, she resumed her regular menstrual cycle the following month.  GYNECOLOGICAL HISTORY: - Last menstrual period: 11/28/2023  OBSTETRICAL HISTORY: Obstetrics History discussed Gravida 5, Para 3 Gestational age: 46 weeks  SOCIAL HISTORY She has 3 children.    Pertinent Gyn History: Menses: Patient's last menstrual period was 11/29/2023.    Patient Active Problem List   Diagnosis Date Noted  . Graves disease    Past Medical History:  Diagnosis Date  . Graves disease    2020  . Hypertension   . Tachycardia     Past Surgical History:  Procedure Laterality Date  . Iodine radiation      Prescriptions Prior to Admission[1] Allergies[2]  Social History   Tobacco Use  . Smoking status: Never    Passive exposure: Never  . Smokeless tobacco: Never  Substance Use Topics  . Alcohol use: Not Currently    Family History  Problem Relation Age of Onset  . Diabetes Paternal Grandmother   . Hypertension Mother   . Thyroid  disease Mother      Review of Systems Pertinent items are noted in HPI.   Objective: Vital signs in last 24 hours: Temp:  [97.8 F (36.6 C)] 97.8 F (36.6 C) Heart Rate:  [69] 69 BP: (104)/(84) 104/84 SpO2:  [99 %] 99 %  Physical  Exam  Assessment/Plan: Assessment & Plan 1. Pregnancy. - An hCG level test will be conducted today. If the results are within the normal range and there is no further bleeding, she will proceed with her scheduled appointment on Monday for an ultrasound. If she experiences any additional bleeding, spotting, or cramping, or if she desires more information, another hCG level test will be performed on Friday. The results of today's test will be communicated to her via MyChart this evening or by phone call tomorrow morning.   Follow-up: 02/07/2024 for ultrasound.    1. Bleeding in early pregnancy (*)   2. Graves disease     Orders Placed This Encounter  Procedures  . hCG, Quantitative    Standing Status:   Future    Expiration Date:   02/01/2025      Patient's Medications       * Accurate as of February 02, 2024  3:01 PM. Reflects encounter med changes as of last refresh          Continued Medications      Instructions  levothyroxine  sodium 137 mcg tablet Commonly known as: SYNTHROID ,LEVOTHROID,LEVOXYL   137 mcg        No follow-ups on file.  Computer technology was used to create visit note.  Consent from the patient/caregiver was obtained prior to its use.       [1] (Not in a hospital admission)  [2] No Known Allergies *Some images could not be shown.

## 2024-02-03 ENCOUNTER — Encounter

## 2024-02-04 DIAGNOSIS — O209 Hemorrhage in early pregnancy, unspecified: Secondary | ICD-10-CM | POA: Diagnosis not present

## 2024-02-06 ENCOUNTER — Other Ambulatory Visit: Payer: Self-pay

## 2024-02-06 ENCOUNTER — Inpatient Hospital Stay (HOSPITAL_COMMUNITY)
Admission: AD | Admit: 2024-02-06 | Discharge: 2024-02-06 | Disposition: A | Discharge status: Home / Self Care | Attending: Obstetrics and Gynecology | Admitting: Obstetrics and Gynecology

## 2024-02-06 ENCOUNTER — Inpatient Hospital Stay (HOSPITAL_COMMUNITY)

## 2024-02-06 ENCOUNTER — Other Ambulatory Visit: Payer: Self-pay | Admitting: Certified Nurse Midwife

## 2024-02-06 DIAGNOSIS — N854 Malposition of uterus: Secondary | ICD-10-CM | POA: Diagnosis not present

## 2024-02-06 DIAGNOSIS — O26891 Other specified pregnancy related conditions, first trimester: Secondary | ICD-10-CM

## 2024-02-06 DIAGNOSIS — O039 Complete or unspecified spontaneous abortion without complication: Secondary | ICD-10-CM | POA: Diagnosis not present

## 2024-02-06 DIAGNOSIS — O209 Hemorrhage in early pregnancy, unspecified: Secondary | ICD-10-CM | POA: Diagnosis not present

## 2024-02-06 DIAGNOSIS — Z3A08 8 weeks gestation of pregnancy: Secondary | ICD-10-CM | POA: Diagnosis not present

## 2024-02-06 DIAGNOSIS — N760 Acute vaginitis: Secondary | ICD-10-CM | POA: Diagnosis not present

## 2024-02-06 DIAGNOSIS — O3481 Maternal care for other abnormalities of pelvic organs, first trimester: Secondary | ICD-10-CM | POA: Diagnosis not present

## 2024-02-06 DIAGNOSIS — Z3A09 9 weeks gestation of pregnancy: Secondary | ICD-10-CM

## 2024-02-06 DIAGNOSIS — B9689 Other specified bacterial agents as the cause of diseases classified elsewhere: Secondary | ICD-10-CM | POA: Diagnosis not present

## 2024-02-06 LAB — CBC WITH DIFFERENTIAL/PLATELET
Abs Immature Granulocytes: 0.03 K/uL (ref 0.00–0.07)
Basophils Absolute: 0.1 K/uL (ref 0.0–0.1)
Basophils Relative: 1 %
Eosinophils Absolute: 0.4 K/uL (ref 0.0–0.5)
Eosinophils Relative: 4 %
HCT: 44.2 % (ref 36.0–46.0)
Hemoglobin: 15 g/dL (ref 12.0–15.0)
Immature Granulocytes: 0 %
Lymphocytes Relative: 31 %
Lymphs Abs: 3.3 K/uL (ref 0.7–4.0)
MCH: 30.6 pg (ref 26.0–34.0)
MCHC: 33.9 g/dL (ref 30.0–36.0)
MCV: 90.2 fL (ref 80.0–100.0)
Monocytes Absolute: 0.9 K/uL (ref 0.1–1.0)
Monocytes Relative: 8 %
Neutro Abs: 5.9 K/uL (ref 1.7–7.7)
Neutrophils Relative %: 56 %
Platelets: 350 K/uL (ref 150–400)
RBC: 4.9 MIL/uL (ref 3.87–5.11)
RDW: 12.7 % (ref 11.5–15.5)
WBC: 10.5 K/uL (ref 4.0–10.5)
nRBC: 0 % (ref 0.0–0.2)

## 2024-02-06 LAB — WET PREP, GENITAL
Sperm: NONE SEEN
Trich, Wet Prep: NONE SEEN
WBC, Wet Prep HPF POC: <10 (ref <10)
Yeast Wet Prep HPF POC: NONE SEEN

## 2024-02-06 LAB — COMPREHENSIVE METABOLIC PANEL WITH GFR
ALT: 18 U/L (ref 0–44)
AST: 22 U/L (ref 15–41)
Albumin: 3.6 g/dL (ref 3.5–5.0)
Alkaline Phosphatase: 39 U/L (ref 38–126)
Anion gap: 8 (ref 5–15)
BUN: 8 mg/dL (ref 6–20)
CO2: 23 mmol/L (ref 22–32)
Calcium: 9 mg/dL (ref 8.9–10.3)
Chloride: 107 mmol/L (ref 98–111)
Creatinine, Ser: 0.7 mg/dL (ref 0.44–1.00)
GFR, Estimated: >60 mL/min (ref >60)
Glucose, Bld: 96 mg/dL (ref 70–99)
Potassium: 3.9 mmol/L (ref 3.5–5.1)
Sodium: 138 mmol/L (ref 135–145)
Total Bilirubin: 0.7 mg/dL (ref 0.0–1.2)
Total Protein: 7 g/dL (ref 6.5–8.1)

## 2024-02-06 LAB — HCG, QUANTITATIVE, PREGNANCY: hCG, Beta Chain, Quant, S: 11360 m[IU]/mL — ABNORMAL HIGH (ref <5)

## 2024-02-06 MED ORDER — ACETAMINOPHEN-CODEINE 300-30 MG PO TABS: 1.0000 | ORAL_TABLET | Freq: Four times a day (QID) | ORAL | 0 refills | Status: DC | PRN

## 2024-02-06 MED ORDER — MISOPROSTOL 200 MCG PO TABS: ORAL_TABLET | ORAL | 1 refills | Status: DC

## 2024-02-06 MED ORDER — METRONIDAZOLE 500 MG PO TABS: 500.0000 mg | ORAL_TABLET | Freq: Two times a day (BID) | ORAL | 0 refills | Status: DC

## 2024-02-06 MED ORDER — PROMETHAZINE HCL 12.5 MG PO TABS: 12.5000 mg | ORAL_TABLET | Freq: Four times a day (QID) | ORAL | 0 refills | Status: DC | PRN

## 2024-02-06 MED ORDER — IBUPROFEN 800 MG PO TABS: 800.0000 mg | ORAL_TABLET | Freq: Three times a day (TID) | ORAL | 0 refills | Status: DC

## 2024-02-06 NOTE — Discharge Instructions (Signed)
 [name],   You were seen tonight in MAU for a miscarriage, I am so sorry for your loss. Below you will find more information on what to expect and how to treat your pain and nausea.    I have sent a message to [facility] to see  you in 7-10 days for an ultrasound and pregnancy hormone level.    There are three options for managing a miscarriage: 1) Expectant Management: This is where you wait for 2 weeks to see if the body passes the pregnancy on its own 2) Cytotec  (Misoprostol ): this is a medication that causes cramping of the uterus and can help passage of the pregnancy.  3) Dilation and curettage: this is a surgical procedure to remove the pregnancy from the uterus  You have decided to do   CYTOTEC    You were given a prescription for 3 medications; ibuprofen  for mild to moderate pain, phenergan  for nausea, and tylenol  for moderate to severe pain.    If you have any problems with obtaining any of the medications, at the pharmacy, please call your office. At your earliest convenience you should place the cytotec  in your cheeks/gums and allow the medication to absorb.  You may experience some chills, fever, abdominal pain, nausea, and/or diarrhea after taking the medication. Bleeding should start within 24 hours, but no greater than 48.  If no bleeding has occurred after 48 hours, take your refill of the medication. You will need to call your office or to go the Maternity Assessment Unit or nearest emergency room for:  -Bleeding that fills up 2 or more pads per hour. -Severe abdominal pain. -Dizziness or lightheadedness. -Passing out -Any other medical concerns  Thank you for trusting us  to care for you, [name], Midwife

## 2024-02-06 NOTE — MAU Note (Addendum)
 Nicole Keller is a 33 y.o. at [redacted]w[redacted]d here in MAU reporting: woke up this morning and went to the bathroom and when she wiped there was pink on the tissue. States it has continued throught the day and only notices when she wipes. Pt is wearing panty liner currently. Denies any abdominal pain. Patient denies any unusual vaginal discharge, vaginal odor, itching or pain. Patient denies any pain with urination or urinary frequency. Denies any recent sexual intercourse. First OB appointment tomorrow.   Onset of complaint: today  Pain score: 0 Vitals:   02/06/24 1634  BP: 114/72  Pulse: 82  Resp: 16  Temp: 98 F (36.7 C)  SpO2: 98%     FHT: n/a Lab orders placed from triage:  Labs and vaginal swabs

## 2024-02-06 NOTE — MAU Provider Note (Signed)
 None     Nicole Keller is a 33 y.o. [phone] pregnant female at [redacted]w[redacted]d by LMP who presents to MAU today with complaint of scant vaginal bleeding she noted pink on the toilet paper when she wiped this morning. Wearing a panty liner but does not see blood other than on wiping. Denies other vaginal discharge changes, itching, pain or odor. No changes in urinary habits, and no difficulty or pain with urination. [name]   Has yet to establish prenatal care, first visit at [facility] in [place] tomorrow.   Pertinent items noted in HPI and remainder of comprehensive ROS otherwise negative.   O BP 114/72 (BP Location: Right Arm)   Pulse 82   Temp 98 F (36.7 C) (Oral)   Resp 16   Ht 5' 1 (1.549 m)   Wt 77.3 kg   LMP 11/29/2023   SpO2 98%   BMI 32.20 kg/m  Physical Exam Vitals reviewed.  Constitutional:      Appearance: Normal appearance.  HENT:     Head: Normocephalic.  Cardiovascular:     Rate and Rhythm: Normal rate and regular rhythm.     Pulses: Normal pulses.     Heart sounds: Normal heart sounds.  Pulmonary:     Effort: Pulmonary effort is normal.     Breath sounds: Normal breath sounds.  Skin:    General: Skin is warm and dry.     Capillary Refill: Capillary refill takes less than 2 seconds.  Neurological:     General: No focal deficit present.     Mental Status: She is alert and oriented to person, place, and time.  Psychiatric:        Mood and Affect: Mood normal.        Behavior: Behavior normal.        Thought Content: Thought content normal.        Judgment: Judgment normal.      MDM: PERU workup Meets criteria for failed pregnancy   After counseling, patient opts for cytotec  management .rdcyt MAU Course:  A Bacterial vaginosis  Miscarriage - Plan: Discharge patient  Medical screening exam complete  P Discharge from MAU in stable condition with strict return  precautions Follow up at [facility], message sent for appointment in 7-10 days for ultrasound and  beta Hcg  Early Intrauterine Pregnancy Failure Protocol X  Documented intrauterine pregnancy failure less than or equal to [redacted] weeks   gestation  X  No serious current illness  X  Baseline Hgb greater than or equal to 10g/dl  X  Patient has easily accessible transportation to the hospital  X  Clear preference  X  Practitioner/physician deems patient reliable  X  Counseling by practitioner or physician  X  Patient education by RN  X  Consent form signed       Rho-Gam given by RN if indicated  X  Medication dispensed  X  Cytotec  800 mcg PO by patient at home, repeat in 48 hours if no bleeding.  X   Ibuprofen  800 mg 1 tablet by mouth every 8 hours as needed #21 - prescribed  X   Tylenol  #3 mg by mouth every 4 to 6 hours as needed - prescribed  X   Phenergan  12.5 mg by mouth every 4 hours as needed for nausea - prescribed  Reviewed with pt cytotec  procedure.  Pt verbalizes that she lives close to the hospital and has transportation readily available.  Pt appears reliable and verbalizes understanding and  agrees with plan of care   Allergies as of 02/06/2024   No Known Allergies      Medication List     TAKE these medications    acetaminophen -codeine  300-30 MG tablet Commonly known as: TYLENOL  #3 Take 1-2 tablets by mouth every 6 (six) hours as needed for moderate pain (pain score 4-6).   ibuprofen  800 MG tablet Commonly known as: ADVIL  Take 1 tablet (800 mg total) by mouth 3 (three) times daily.   levothyroxine  137 MCG tablet Commonly known as: SYNTHROID  Take 1 tablet (137 mcg total) by mouth as directed. 2 tabs on Sundays and Wednesdays and  1 tab rest of the week   metroNIDAZOLE  500 MG tablet Commonly known as: FLAGYL  Take 1 tablet (500 mg total) by mouth 2 (two) times daily.   misoprostol  200 MCG tablet Commonly known as: CYTOTEC  Place four tablets in between your gums and cheeks (two tablets on each side) as instructed repeat in 72 hours (3 days) if you have not had a  significant increase in bleeding with passage of tissue.   promethazine  12.5 MG tablet Commonly known as: PHENERGAN  Take 1 tablet (12.5 mg total) by mouth every 6 (six) hours as needed for nausea or vomiting.        [name] Rote, MSN, CNM 02/06/2024 7:05 PM  Certified Nurse Midwife, [facility]

## 2024-02-07 LAB — GC/CHLAMYDIA PROBE AMP (~~LOC~~) NOT AT ARMC
Chlamydia: NEGATIVE
Comment: NEGATIVE
Comment: NORMAL
Neisseria Gonorrhea: NEGATIVE

## 2024-02-08 DIAGNOSIS — O039 Complete or unspecified spontaneous abortion without complication: Secondary | ICD-10-CM | POA: Diagnosis not present

## 2024-02-08 DIAGNOSIS — O209 Hemorrhage in early pregnancy, unspecified: Secondary | ICD-10-CM | POA: Diagnosis not present

## 2024-02-08 DIAGNOSIS — Z3009 Encounter for other general counseling and advice on contraception: Secondary | ICD-10-CM | POA: Diagnosis not present

## 2024-02-08 NOTE — Progress Notes (Signed)
  History of Present Illness 33 year old female presents for ultrasound and office visit after [place] diagnosis of missed early pregnancy loss. Took Cytotec  on Sunday and Monday, has been experiencing bleeding with clots but was concerned as it was less severe bleeding than previous miscarriage in 10/2023. First miscarriage at [redacted] weeks gestation, no blood work confirmation completed.   Considering non-hormonal birth control options. Previously used Paragard IUD, removed due to vaginal infection but otherwise liked method.  CONTRACEPTION: Considering non-hormonal IUD Best boy)   Past Medical History, Past Surgery History, Allergies, Social History, and Family History were reviewed and updated.    Review of Systems is complete and negative except as noted in HPI  Objective   BP 122/76 (BP Location: Left Upper Arm, Patient Position: Sitting)   Ht 5' 1 (1.549 m)   LMP 11/29/2023   BMI 32.52 kg/m  General:  Well Developed, Well Nourished, No distress Physical exam deferred.      Impression   1. SAB (spontaneous abortion) (*)   2. General counseling and advice for contraceptive management     Plan   Assessment & Plan 1. Early pregnancy loss: - Ultrasound confirmed absence of previously seen gestational sac, no increased vascularity within endometrium noted -Right ovary difficult to visualize ?bowel versus possible dermoid cyst. Per Dr. Cipriano follow up US  in 3 months.  - Advised home urine pregnancy test in 2-3 weeks to ensure return to negative - If positive, repeat ultrasound to confirm remaining tissue - If negative, no further action  2. Birth control: - Interested in non-hormonal options - Discussed ParaGard IUD, 99.9% effective, removable with immediate return to fertility - NuSwab prior to insertion to check for infections due to report or removal due to infection in the past - Take 800 mg ibuprofen  before procedure - Schedule insertion after return from [place] if  pregnancy test is negative  3. Suspected ovarian cyst: - Ultrasound revealed possible ovarian cyst or bowel gas next to right ovary - Follow-up ultrasound in 3 months to reassess ovary  Follow-up: - Home urine pregnancy test in 2-3 weeks - Follow-up ultrasound in 3 months - Paragard IUD insertion after negative UPT      Patient's Medications       * Accurate as of February 08, 2024  3:37 PM. Reflects encounter med changes as of last refresh          Continued Medications      Instructions  levothyroxine  sodium 137 mcg tablet Commonly known as: SYNTHROID ,LEVOTHROID,LEVOXYL   137 mcg        No orders of the defined types were placed in this encounter.   Imaging studies and lab work were reviewed with the patient where applicable.  Risks, benefits, and alternatives of the medications and treatment plan prescribed today were discussed, and patient expressed understanding. Plan follow-up as discussed or as needed if any worsening symptoms or change in condition.    A yearly preventative health exam was recommended and current age based recommendations were discussed.    Counseling time: 30 minutes *Some images could not be shown.

## 2024-02-10 ENCOUNTER — Ambulatory Visit

## 2024-02-10 VITALS — BP 120/79 | HR 67 | Wt 170.4 lb

## 2024-02-10 DIAGNOSIS — Z23 Encounter for immunization: Secondary | ICD-10-CM

## 2024-02-10 NOTE — Progress Notes (Signed)
 Pt is in the office for 3rd Gardasil injection.  Administered in [place] and pt tolerated well.

## 2024-02-14 ENCOUNTER — Other Ambulatory Visit

## 2024-02-24 ENCOUNTER — Ambulatory Visit: Admitting: Physician Assistant

## 2024-02-25 ENCOUNTER — Other Ambulatory Visit

## 2024-02-28 ENCOUNTER — Other Ambulatory Visit: Payer: Self-pay

## 2024-02-29 ENCOUNTER — Other Ambulatory Visit

## 2024-02-29 DIAGNOSIS — E890 Postprocedural hypothyroidism: Secondary | ICD-10-CM | POA: Diagnosis not present

## 2024-03-01 ENCOUNTER — Encounter: Payer: Self-pay | Admitting: Obstetrics and Gynecology

## 2024-03-01 ENCOUNTER — Ambulatory Visit (INDEPENDENT_AMBULATORY_CARE_PROVIDER_SITE_OTHER): Admitting: Obstetrics and Gynecology

## 2024-03-01 VITALS — BP 111/72 | HR 62 | Ht 61.0 in | Wt 170.8 lb

## 2024-03-01 DIAGNOSIS — O039 Complete or unspecified spontaneous abortion without complication: Secondary | ICD-10-CM | POA: Diagnosis not present

## 2024-03-01 DIAGNOSIS — F419 Anxiety disorder, unspecified: Secondary | ICD-10-CM

## 2024-03-01 LAB — TSH: TSH: 0.79 m[IU]/L

## 2024-03-01 LAB — T4, FREE: Free T4: 1.3 ng/dL (ref 0.8–1.8)

## 2024-03-01 MED ORDER — HYDROXYZINE HCL 25 MG PO TABS: 25.0000 mg | ORAL_TABLET | Freq: Four times a day (QID) | ORAL | 2 refills | Status: DC | PRN

## 2024-03-01 NOTE — Progress Notes (Signed)
 Pt presents for SAB f/u. Pt reports no pain or bleeding today. Has concerns about US  for retained products of conceptions.   Pt interest in [facility] but not sure what method, vasectomy vs IUD

## 2024-03-01 NOTE — Progress Notes (Signed)
   GYNECOLOGY PROGRESS NOTE  History:  33 y.o. [id number] presents to [facility] for SAB follow up.   Diagnosed with missed early pregnancy loss 7/27, took cytotec  7/27  and had bleeding with clots. Went to [facility] 7/29, d/t bleeding had ultrasound which confirmed SAB She feels overall mood okay, she just got back from a trip to [place] to help get her mind off of it. She does endorse feeling moments of anxiety, she was on anxiety meds previously  Considering IUD vs vasectomy, unsure if they want another baby, she  had mirena before and did well on it. Considering paragard for no hormones as well.    The following portions of the patient's history were reviewed and updated as appropriate: allergies, current medications, past family history, past medical history, past social history, past surgical history and problem list. Last pap smear on 06/14/23 was LSIL +hpv, colpo 08/12/23   Health Maintenance Due  Topic Date Due   Hepatitis B Vaccines 19-59 Average Risk (1 of 3 - 19+ 3-dose series) Never done   COVID-19 Vaccine (3 - 2024-25 season) 03/14/2023   INFLUENZA VACCINE  02/11/2024     Review of Systems:  Pertinent items are noted in HPI.   Objective:  Physical Exam Blood pressure 111/72, pulse 62, height 5' 1 (1.549 m), weight 170 lb 12.8 oz (77.5 kg), not currently breastfeeding. VS reviewed, nursing note reviewed,  Constitutional: well developed, well nourished, no distress HEENT: normocephalic Pulm/chest wall: normal effort Neuro: alert and oriented  Skin: warm, dry Psych: affect normal Pelvic exam: deferred  Assessment & Plan:  1. Spontaneous miscarriage (Primary) Bleeding subsided 7/29 u/s at [facility], IUP no longer seen Impression:  Complete AB. Complex right adnexal mass, possibly dermoid vs bowel.  Consider repeat US  in 12 weeks to reassess adnexa.  Planning repeat ultrasound in 3 months for suspected ovarian cyst vs other scheduled with [facility], follow up here if needed   Discussed birth control options and mirena vs paragard, she is going to talk about it with husband to decide between IUD vs vasectomy  2. Anxiety Discussed counseling vs medication vs PRN med, she opts to try PRN med today - hydrOXYzine  (ATARAX ) 25 MG tablet; Take 1 tablet (25 mg total) by mouth every 6 (six) hours as needed for itching.  Dispense: 30 tablet; Refill: 2   No follow-ups on file.   Nidia Daring, FNP 4:10 PM

## 2024-03-07 ENCOUNTER — Other Ambulatory Visit

## 2024-03-07 ENCOUNTER — Ambulatory Visit: Admitting: Internal Medicine

## 2024-03-21 ENCOUNTER — Encounter: Payer: Self-pay | Admitting: Internal Medicine

## 2024-03-21 ENCOUNTER — Ambulatory Visit (INDEPENDENT_AMBULATORY_CARE_PROVIDER_SITE_OTHER): Admitting: Internal Medicine

## 2024-03-21 VITALS — BP 112/68 | HR 62 | Ht 61.0 in | Wt 168.0 lb

## 2024-03-21 DIAGNOSIS — E0500 Thyrotoxicosis with diffuse goiter without thyrotoxic crisis or storm: Secondary | ICD-10-CM | POA: Diagnosis not present

## 2024-03-21 DIAGNOSIS — E890 Postprocedural hypothyroidism: Secondary | ICD-10-CM | POA: Diagnosis not present

## 2024-03-21 MED ORDER — LEVOTHYROXINE SODIUM 137 MCG PO TABS: 137.0000 ug | ORAL_TABLET | ORAL | 3 refills | Status: DC

## 2024-03-21 NOTE — Progress Notes (Signed)
 Name: Nicole Keller  MRN/ DOB: [id number], 25-Nov-1990    Age/ Sex: 33 y.o., female     PCP: Celestia Rosaline SQUIBB, NP   Reason for Endocrinology Evaluation: Graves' Disease/postablative hypothyroidism     Initial Endocrinology Clinic Visit: 03/30/2018    PATIENT IDENTIFIER: [name] is a 33 y.o., female with a past medical history of Graves' disease. She has followed with [facility] since 03/30/2018 for consultative assistance with management of her [name] disease.   HISTORICAL SUMMARY: The patient was first diagnosed with hyperthyroidism secondary to Graves' disease in 2018.  She was on methimazole  between 2018-2019.   She is s/p RAI with 17.4 mCi I-131 sodium iodide orally on 05/29/2018.  She was started on LT-for replacement shortly after. Of a baby girl 03/2023  S/P delivery 03/2023 - baby girl  Nicole Keller  Had a miscarriage 11/2023  SUBJECTIVE:    Today (03/21/2024):  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     She did have miscarriage 02/06/2024  No local neck swelling  Has noted scratchy throat Continues with constipation - uses OTC supplements Denies palpitations  Has occasional tremors that she attributes to anxiety    Levothyroxine  137 mcg, 2 tabs on Sundays , 1 tablet rest of the week  HISTORY:  Past Medical History:  Past Medical History:  Diagnosis Date   Depression    Depression with anxiety 10/12/2017   Depression with anxiety 10/12/2017   Essential hypertension 08/09/2018   Essential hypertension   Hypertension    Hyperthyroidism    Hypothyroid    Hypothyroidism 09/06/2018   Pap smear abnormality of cervix with LGSIL 12/07/2017   +HPV > colposcopy      Tachycardia 10/12/2017   Palpitations/tachycardia   Vaginal discharge 05/07/2020   Past Surgical History:  Past Surgical History:  Procedure Laterality Date   NO PAST SURGERIES     Social History:  reports that she has never smoked. She has never  used smokeless tobacco. She reports that she does not currently use alcohol. She reports that she does not currently use drugs. Family History:  Family History  Problem Relation Age of Onset   Hypertension Mother    Thyroid  disease Mother    Hyperlipidemia Father    Thyroid  disease Sister    Hypertension Maternal Grandmother    Diabetes Paternal Grandmother      HOME MEDICATIONS: Allergies as of 03/21/2024   No Known Allergies      Medication List        Accurate as of March 21, 2024  7:38 AM. If you have any questions, ask your nurse or doctor.          acetaminophen -codeine  300-30 MG tablet Commonly known as: TYLENOL  #3 Take 1-2 tablets by mouth every 6 (six) hours as needed for moderate pain (pain score 4-6).   hydrOXYzine  25 MG tablet Commonly known as: ATARAX  Take 1 tablet (25 mg total) by mouth every 6 (six) hours as needed for itching.   ibuprofen  800 MG tablet Commonly known as: ADVIL  Take 1 tablet (800 mg total) by mouth 3 (three) times daily.   levothyroxine  137 MCG tablet Commonly known as: SYNTHROID  Take 1 tablet (137 mcg total) by mouth as directed. 2 tabs on Sundays and Wednesdays and  1 tab rest of the week   metroNIDAZOLE  500 MG tablet Commonly known as: FLAGYL  Take 1 tablet (500 mg total) by mouth 2 (two) times daily.   misoprostol  200 MCG tablet  Commonly known as: CYTOTEC  Place four tablets in between your gums and cheeks (two tablets on each side) as instructed repeat in 72 hours (3 days) if you have not had a significant increase in bleeding with passage of tissue.   promethazine  12.5 MG tablet Commonly known as: PHENERGAN  Take 1 tablet (12.5 mg total) by mouth every 6 (six) hours as needed for nausea or vomiting.          OBJECTIVE:   PHYSICAL EXAM: VS: BP 112/68 (BP Location: Left Arm, Patient Position: Sitting, Cuff Size: Normal)   Pulse 62   Ht 5' 1 (1.549 m)   Wt 168 lb (76.2 kg)   SpO2 99%   BMI 31.74 kg/m   Filed  Weights   03/21/24 0732  Weight: 168 lb (76.2 kg)      EXAM: General: Pt appears well and is in NAD  Neck: General: Supple without adenopathy. Thyroid :  No goiter or nodules appreciated.  Lungs: Clear with good BS bilat   Heart: Auscultation: RRR.  Extremities:  BL LE: No pretibial edema   Mental Status: Judgment, insight: Intact Orientation: Oriented to time, place, and person Mood and affect: No depression, anxiety, or agitation     DATA REVIEWED:    Latest Reference Range & Units 02/29/24 00:00  TSH mIU/L 0.79  T4,Free(Direct) 0.8 - 1.8 ng/dL 1.3    ASSESSMENT / PLAN / RECOMMENDATIONS:   Postablative Hypothyroidism   - Patient is clinically euthyroid - Most recent TFTs were within normal range - No change at this time   Medications   levothyroxine  137 mcg , 2 tabs on Sundays, 1 tablet rest of the week   2. Graves' Disease:  - No extrathyroidal manifestations of Graves' Disease   F/U in 6 months      Signed electronically by: Stefano Redgie Butts, MD  [facility] Endocrinology  [facility] [address] [place] [place], [place] [address] Phone: [phone] FAX: [phone]      CC: Celestia Rosaline SQUIBB, NP [address] [place] [address] Phone: [phone]  Fax: [phone]   Return to Endocrinology clinic as below: Future Appointments  Date Time Provider Department Center  07/14/2024  7:30 AM Dorrien Grunder, Donell Redgie, MD [facility] None

## 2024-03-21 NOTE — Patient Instructions (Signed)

## 2024-05-10 DIAGNOSIS — N9489 Other specified conditions associated with female genital organs and menstrual cycle: Secondary | ICD-10-CM | POA: Diagnosis not present

## 2024-05-31 DIAGNOSIS — N838 Other noninflammatory disorders of ovary, fallopian tube and broad ligament: Secondary | ICD-10-CM | POA: Diagnosis not present

## 2024-07-11 ENCOUNTER — Ambulatory Visit: Admitting: Internal Medicine

## 2024-07-11 ENCOUNTER — Encounter: Payer: Self-pay | Admitting: Internal Medicine

## 2024-07-11 ENCOUNTER — Other Ambulatory Visit

## 2024-07-11 VITALS — BP 122/78 | HR 70 | Ht 61.0 in | Wt 172.0 lb

## 2024-07-11 DIAGNOSIS — E890 Postprocedural hypothyroidism: Secondary | ICD-10-CM

## 2024-07-11 LAB — TSH: TSH: 5.8 m[IU]/L — ABNORMAL HIGH

## 2024-07-11 LAB — T4, FREE: Free T4: 1 ng/dL (ref 0.8–1.8)

## 2024-07-11 NOTE — Progress Notes (Unsigned)
 "  Name: Nicole Keller  MRN/ DOB: [id number], 1991-05-08    Age/ Sex: 33 y.o., female     PCP: Celestia Rosaline SQUIBB, NP   Reason for Endocrinology Evaluation: Graves' Disease/postablative hypothyroidism     Initial Endocrinology Clinic Visit: 03/30/2018    PATIENT IDENTIFIER: Nicole Keller is a 33 y.o., female with a past medical history of Graves' disease. She has followed with [facility] since 03/30/2018 for consultative assistance with management of her [name] disease.   HISTORICAL SUMMARY: The patient was first diagnosed with hyperthyroidism secondary to Graves' disease in 2018.  She was on methimazole  between 2018-2019.   She is s/p RAI with 17.4 mCi I-131 sodium iodide orally on 05/29/2018.  She was started on LT-for replacement shortly after. 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   Patient is scheduled for a right salpingo-oophorectomy on 07/14/2024 Patient is complaining of weight gain, she has made changes to her diet including cutting out sweets and reducing carbohydrates No local neck swelling  Has occasional tremors  No palpitations unless she forgets to take levothyroxine   Continues with constipation - uses OTC supplements No COC's - uses condoms  LMP 12/26th    Levothyroxine  137 mcg, 2 tabs on Sundays , 1 tablet rest of the week  HISTORY:  Past Medical History:  Past Medical History:  Diagnosis Date   Depression    Depression with anxiety 10/12/2017   Depression with anxiety 10/12/2017   Essential hypertension 08/09/2018   Essential hypertension   Hypertension    Hyperthyroidism    Hypothyroid    Hypothyroidism 09/06/2018   Pap smear abnormality of cervix with LGSIL 12/07/2017   +HPV > colposcopy      Tachycardia 10/12/2017   Palpitations/tachycardia   Vaginal discharge  05/07/2020   Past Surgical History:  Past Surgical History:  Procedure Laterality Date   NO PAST SURGERIES     Social History:  reports that she has never smoked. She has never used smokeless tobacco. She reports that she does not currently use alcohol. She reports that she does not currently use drugs. Family History:  Family History  Problem Relation Age of Onset   Hypertension Mother    Thyroid  disease Mother    Hyperlipidemia Father    Thyroid  disease Sister    Hypertension Maternal Grandmother    Diabetes Paternal Grandmother      HOME MEDICATIONS: Allergies as of 07/11/2024   No Known Allergies      Medication List        Accurate as of July 11, 2024  2:13 PM. If you have any questions, ask your nurse or doctor.          STOP taking these medications    acetaminophen -codeine  300-30 MG tablet Commonly known as: TYLENOL  #3 Stopped by: Donell Butts, MD   hydrOXYzine  25 MG tablet Commonly known as: ATARAX  Stopped by: Donell Butts, MD   ibuprofen  800 MG tablet Commonly known as: ADVIL  Stopped by: Donell Butts, MD   metroNIDAZOLE  500 MG tablet Commonly known as: FLAGYL  Stopped by: Donell Butts, MD   misoprostol  200 MCG tablet Commonly known as: CYTOTEC  Stopped by: Donell Butts, MD   promethazine  12.5 MG tablet Commonly known as: PHENERGAN  Stopped by: Donell Butts, MD       TAKE these medications  levothyroxine  137 MCG tablet Commonly known as: SYNTHROID  Take 1 tablet (137 mcg total) by mouth as directed. 2 tabs on Sundays and  1 tab rest of the week          OBJECTIVE:   PHYSICAL EXAM: VS: BP 122/78   Pulse 70   Ht 5' 1 (1.549 m)   Wt 172 lb (78 kg)   SpO2 95%   BMI 32.50 kg/m   Filed Weights   07/11/24 1358  Weight: 172 lb (78 kg)       EXAM: General: Pt appears well and is in NAD  Neck: General: Supple without adenopathy. Thyroid :  No goiter or nodules appreciated.  Lungs:  Clear with good BS bilat   Heart: Auscultation: RRR.  Extremities:  BL LE: No pretibial edema   Mental Status: Judgment, insight: Intact Orientation: Oriented to time, place, and person Mood and affect: No depression, anxiety, or agitation     DATA REVIEWED:    Latest Reference Range & Units 02/29/24 00:00  TSH mIU/L 0.79  T4,Free(Direct) 0.8 - 1.8 ng/dL 1.3    ASSESSMENT / PLAN / RECOMMENDATIONS:   Postablative Hypothyroidism   - Patient is clinically euthyroid - Most recent TFTs were within normal range - No change at this time   Medications   levothyroxine  137 mcg , 2 tabs on Sundays, 1 tablet rest of the week   2. Graves' Disease:  - No extrathyroidal manifestations of Graves' Disease   F/U in 6 months      Signed electronically by: Stefano Redgie Butts, MD  [facility] Endocrinology  [facility] [address] [place], [place] [address] Phone: [phone] FAX: [phone]      CC: Celestia Rosaline SQUIBB, NP [address] [place] [address] Phone: [phone]  Fax: [phone]   Return to Endocrinology clinic as below: Future Appointments  Date Time Provider Department Center  09/19/2024  7:50 AM Merrill Deanda, Donell Redgie, MD [facility] None     "

## 2024-07-12 ENCOUNTER — Other Ambulatory Visit: Payer: Self-pay | Admitting: Internal Medicine

## 2024-07-12 ENCOUNTER — Telehealth: Payer: Self-pay | Admitting: Internal Medicine

## 2024-07-12 ENCOUNTER — Ambulatory Visit: Payer: Self-pay | Admitting: Internal Medicine

## 2024-07-12 MED ORDER — LEVOTHYROXINE SODIUM 137 MCG PO TABS: 137.0000 ug | ORAL_TABLET | ORAL | 3 refills | Status: AC

## 2024-07-12 NOTE — Telephone Encounter (Signed)
Pt saw labs on mychart

## 2024-07-12 NOTE — Telephone Encounter (Signed)
 Patient is calling for the lab results from yesterday, 07/11/2024.

## 2024-07-14 ENCOUNTER — Ambulatory Visit: Admitting: Internal Medicine

## 2024-08-23 ENCOUNTER — Other Ambulatory Visit

## 2024-09-19 ENCOUNTER — Ambulatory Visit: Admitting: Internal Medicine
